# Patient Record
Sex: Male | Born: 1955 | Race: White | Hispanic: No | Marital: Married | Smoking: Former smoker
Health system: Southern US, Community
[De-identification: ages and names within clinical notes are randomized; demographics above are authoritative.]

## PROBLEM LIST (undated history)

## (undated) DIAGNOSIS — K222 Esophageal obstruction: Secondary | ICD-10-CM

## (undated) DIAGNOSIS — E7800 Pure hypercholesterolemia, unspecified: Secondary | ICD-10-CM

## (undated) DIAGNOSIS — K449 Diaphragmatic hernia without obstruction or gangrene: Secondary | ICD-10-CM

## (undated) DIAGNOSIS — K219 Gastro-esophageal reflux disease without esophagitis: Secondary | ICD-10-CM

## (undated) DIAGNOSIS — Z8719 Personal history of other diseases of the digestive system: Secondary | ICD-10-CM

## (undated) DIAGNOSIS — D369 Benign neoplasm, unspecified site: Secondary | ICD-10-CM

## (undated) DIAGNOSIS — K760 Fatty (change of) liver, not elsewhere classified: Secondary | ICD-10-CM

## (undated) DIAGNOSIS — I10 Essential (primary) hypertension: Secondary | ICD-10-CM

## (undated) DIAGNOSIS — E119 Type 2 diabetes mellitus without complications: Secondary | ICD-10-CM

## (undated) DIAGNOSIS — M1990 Unspecified osteoarthritis, unspecified site: Secondary | ICD-10-CM

## (undated) DIAGNOSIS — G4730 Sleep apnea, unspecified: Secondary | ICD-10-CM

## (undated) HISTORY — DX: Esophageal obstruction: K22.2

## (undated) HISTORY — DX: Diaphragmatic hernia without obstruction or gangrene: K44.9

## (undated) HISTORY — DX: Benign neoplasm, unspecified site: D36.9

---

## 2005-04-08 ENCOUNTER — Ambulatory Visit (HOSPITAL_COMMUNITY): Admission: RE | Admit: 2005-04-08 | Discharge: 2005-04-08 | Payer: Self-pay | Admitting: Internal Medicine

## 2010-01-01 ENCOUNTER — Ambulatory Visit: Payer: Self-pay | Admitting: Internal Medicine

## 2010-01-01 DIAGNOSIS — K921 Melena: Secondary | ICD-10-CM | POA: Insufficient documentation

## 2010-01-01 DIAGNOSIS — K219 Gastro-esophageal reflux disease without esophagitis: Secondary | ICD-10-CM

## 2010-01-01 DIAGNOSIS — R1310 Dysphagia, unspecified: Secondary | ICD-10-CM | POA: Insufficient documentation

## 2010-01-05 ENCOUNTER — Encounter: Payer: Self-pay | Admitting: Internal Medicine

## 2010-01-19 ENCOUNTER — Ambulatory Visit: Payer: Self-pay | Admitting: Internal Medicine

## 2010-01-19 ENCOUNTER — Ambulatory Visit (HOSPITAL_COMMUNITY): Admission: RE | Admit: 2010-01-19 | Discharge: 2010-01-19 | Payer: Self-pay | Admitting: Internal Medicine

## 2010-01-19 HISTORY — PX: ESOPHAGOGASTRODUODENOSCOPY: SHX1529

## 2010-01-19 HISTORY — PX: COLONOSCOPY: SHX174

## 2010-01-21 ENCOUNTER — Encounter: Payer: Self-pay | Admitting: Internal Medicine

## 2010-07-24 ENCOUNTER — Ambulatory Visit (HOSPITAL_COMMUNITY)
Admission: RE | Admit: 2010-07-24 | Discharge: 2010-07-24 | Payer: Self-pay | Source: Home / Self Care | Admitting: Orthopaedic Surgery

## 2010-08-18 ENCOUNTER — Ambulatory Visit (HOSPITAL_COMMUNITY)
Admission: RE | Admit: 2010-08-18 | Discharge: 2010-08-18 | Payer: Self-pay | Source: Home / Self Care | Attending: Orthopaedic Surgery | Admitting: Orthopaedic Surgery

## 2010-08-21 ENCOUNTER — Encounter (HOSPITAL_COMMUNITY)
Admission: RE | Admit: 2010-08-21 | Discharge: 2010-09-20 | Payer: Self-pay | Source: Home / Self Care | Attending: Orthopaedic Surgery | Admitting: Orthopaedic Surgery

## 2010-08-30 HISTORY — PX: JOINT REPLACEMENT: SHX530

## 2010-09-21 ENCOUNTER — Encounter (HOSPITAL_COMMUNITY)
Admission: RE | Admit: 2010-09-21 | Discharge: 2010-09-29 | Payer: Self-pay | Source: Home / Self Care | Attending: Orthopaedic Surgery | Admitting: Orthopaedic Surgery

## 2010-09-29 NOTE — Letter (Signed)
Summary: EGD/ED/TCS ORDER  EGD/ED/TCS ORDER   Imported By: Ave Filter 01/01/2010 14:40:08  _____________________________________________________________________  External Attachment:    Type:   Image     Comment:   External Document

## 2010-09-29 NOTE — Assessment & Plan Note (Signed)
Summary: ESOPHAGEAL STRICTURE,CONSULT FOR TCS/SS   Visit Type:  Initial Consult Referring Provider:  McGough Primary Care Provider:  McGough  Chief Complaint:  difficulty swallowing.  History of Present Illness: [name] is a pleasant 55 y/o WM, patient of Dr. Regino Schultze, who presents for further evaluation of difficulty swallowing and brbpr. He was diagnosed with GERD 25 years ago and started on medication then. However, over last 20 years, had mild GERD and not on medications. Two months ago started having bad heartburn. Solid foods and carbonated beverages would not go down. At times, breads and meats would come back up. Some odynophagia. No hematemesis. No constipation, diarrhea, abd pain, unintentional weight loss. Recent brbpr on toilet tissue. Takes Aleve occasionally for aches and pains. Since on Aciphex for past three weeks he has noted some improvement in swallowing.      Current Medications (verified): 1)  Aciphex 20 Mg Tbec (Rabeprazole Sodium) .... Take 1 Tablet By Mouth Once A Day 2)  Zocor 10 Mg Tabs (Simvastatin) .... Take 1 Tab By Mouth At Bedtime 3)  Ziac 10-6.25 Mg Tabs (Bisoprolol-Hydrochlorothiazide) .... Take 1 Tablet By Mouth Once A Day 4)  Fish Oil  1200 .... Two Tablets Every Am 5)  Asa 81 Mg .... Take 1 Tablet By Mouth Once A Day 6)  Multi-Vitamin .... Take 1 Tablet By Mouth Once A Day 7)  Aleve .... As Needed  Allergies (verified): No Known Drug Allergies  Past History:  Past Medical History: GERD Hyperlipidemia Hypertension Allergies  Past Surgical History: Unremarkable  Family History: Brother, deceased, cirrhosis - alcohol and drug related No FH CRC, IBD.  Social History: Married. Two children. None smoker, quit 30 yrs ago. Chews tobacco. No alcohol for four years. Never heavy. No drug use.  Review of Systems General:  Denies fever, chills, sweats, anorexia, fatigue, weakness, and weight loss. Eyes:  Denies vision loss. ENT:  Complains of  difficulty swallowing; denies nasal congestion, sore throat, and hoarseness. CV:  Denies chest pains, angina, palpitations, dyspnea on exertion, and peripheral edema. Resp:  Denies dyspnea at rest, dyspnea with exercise, and cough. GI:  See HPI. GU:  Denies urinary burning and blood in urine. MS:  Complains of joint pain / LOM. Derm:  Denies rash and itching. Neuro:  Denies weakness, frequent headaches, memory loss, and confusion. Psych:  Denies depression and anxiety. Endo:  Denies unusual weight change. Heme:  Denies bruising and bleeding. Allergy:  Denies hives and rash.  Vital Signs:  Patient profile:   55 year old male Height:      73 inches Weight:      264 pounds BMI:     34.96 Temp:     97.5 degrees F oral Pulse rate:   60 / minute BP sitting:   130 / 84  (left arm) Cuff size:   large  Vitals Entered By: Cloria Spring LPN (Jan 01, 6961 1:58 PM)  Physical Exam  General:  Well developed, well nourished, no acute distress. Head:  Normocephalic and atraumatic. Eyes:  Conjunctivae pink, no scleral icterus.  Mouth:  Oropharyngeal mucosa moist, pink.  No lesions, erythema or exudate.    Neck:  Supple; no masses or thyromegaly. Lungs:  Clear throughout to auscultation. Heart:  Regular rate and rhythm; no murmurs, rubs,  or bruits. Abdomen:  obese.  Bowel sounds normal.  Abdomen is soft, nontender, nondistended.  No rebound or guarding.  No hepatosplenomegaly, masses.  No abdominal bruits. Small easily reducible umbilical hernia. Rectal:  deferred until time of colonoscopy.   Extremities:  No clubbing, cyanosis, edema or deformities noted. Neurologic:  Alert and  oriented x4;  grossly normal neurologically. Skin:  Ruddy complexion.  Cervical Nodes:  No significant cervical adenopathy. Psych:  Alert and cooperative. Normal mood and affect.  Impression & Recommendations:  Problem # 1:  DYSPHAGIA UNSPECIFIED (ICD-787.20)  Dysphagia with recent recurrence of GERD. Suspect  esophagitis vs esophageal stricture/ring. Some improvement on Aciphex now. Also need screen for Barrett's esophagus. EGD/ED to be performed in near future.  Risks, alternatives, benefits including but not limited to risk of reaction to medications, bleeding, infection, and perforation addressed.  Patient voiced understanding and verbal consent obtained.   Continue Aciphex 20mg  by mouth daily. #20 samples provided.   Orders: Consultation Level III ([id number])  Problem # 2:  HEMATOCHEZIA (ICD-578.1)  Recent onset. No prior TCS. Colonoscopy to be performed in near future.  Risks, alternatives, and benefits including but not limited to the risk of reaction to medication, bleeding, infection, and perforation were addressed.  Patient voiced understanding and provided verbal consent.   Orders: Consultation Level III [phone]) I would like to thank Dr. Regino Schultze for allowing [place] to take part in the care of this nice patient.

## 2010-09-29 NOTE — Letter (Signed)
Summary: Patient Notice, Colon Biopsy Results  [facility]  [address]   [place], [place] [address]   Phone: [phone]  Fax: [phone]       Jan 21, 2010   Jonathan Grimes [address] [place], [place]  [address] 1955-09-26    Dear Jonathan Grimes,  I am pleased to inform you that the biopsies taken during your recent colonoscopy did not show any evidence of cancer upon pathologic examination.  Additional information/recommendations:  No further action is needed at this time.  Please follow-up with your primary care physician for your other healthcare needs.  Continue with the treatment plan as outlined on the day of your exam.  You should have a repeat colonoscopy examination  in 7 years.  Please call us if you are having persistent problems or have questions about your condition that have not been fully answered at this time.  Sincerely,    R. Roetta Sessions MD, FACP [facility] Ph: [phone]    Fax: [phone]   Appended Document: Patient Notice, Colon Biopsy Results letter mailed to pt  Appended Document: Patient Notice, Colon Biopsy Results reminder in computer

## 2010-09-29 NOTE — Letter (Signed)
Summary: External Other  External Other   Imported By: Peggyann Shoals 01/05/2010 13:50:41  _____________________________________________________________________  External Attachment:    Type:   Image     Comment:   External Document

## 2010-09-30 ENCOUNTER — Ambulatory Visit (HOSPITAL_COMMUNITY): Payer: Self-pay

## 2010-09-30 ENCOUNTER — Ambulatory Visit (HOSPITAL_COMMUNITY)
Admission: RE | Admit: 2010-09-30 | Discharge: 2010-09-30 | Disposition: A | Discharge status: Home / Self Care | Payer: PRIVATE HEALTH INSURANCE | Source: Ambulatory Visit | Attending: Orthopaedic Surgery | Admitting: Orthopaedic Surgery

## 2010-09-30 DIAGNOSIS — IMO0001 Reserved for inherently not codable concepts without codable children: Secondary | ICD-10-CM | POA: Insufficient documentation

## 2010-09-30 DIAGNOSIS — M25569 Pain in unspecified knee: Secondary | ICD-10-CM | POA: Insufficient documentation

## 2010-09-30 DIAGNOSIS — M6281 Muscle weakness (generalized): Secondary | ICD-10-CM | POA: Insufficient documentation

## 2010-09-30 DIAGNOSIS — M25669 Stiffness of unspecified knee, not elsewhere classified: Secondary | ICD-10-CM | POA: Insufficient documentation

## 2010-10-02 ENCOUNTER — Ambulatory Visit (HOSPITAL_COMMUNITY): Payer: Self-pay

## 2010-10-02 ENCOUNTER — Encounter (HOSPITAL_COMMUNITY)
Admission: RE | Admit: 2010-10-02 | Discharge: 2010-10-02 | Disposition: A | Discharge status: Home / Self Care | Payer: PRIVATE HEALTH INSURANCE | Source: Ambulatory Visit | Attending: Orthopaedic Surgery | Admitting: Orthopaedic Surgery

## 2010-10-02 DIAGNOSIS — M6281 Muscle weakness (generalized): Secondary | ICD-10-CM | POA: Insufficient documentation

## 2010-10-02 DIAGNOSIS — M25669 Stiffness of unspecified knee, not elsewhere classified: Secondary | ICD-10-CM | POA: Insufficient documentation

## 2010-10-02 DIAGNOSIS — IMO0001 Reserved for inherently not codable concepts without codable children: Secondary | ICD-10-CM | POA: Insufficient documentation

## 2010-10-02 DIAGNOSIS — M25569 Pain in unspecified knee: Secondary | ICD-10-CM | POA: Insufficient documentation

## 2010-10-05 ENCOUNTER — Ambulatory Visit (HOSPITAL_COMMUNITY)
Admission: RE | Admit: 2010-10-05 | Discharge: 2010-10-05 | Disposition: A | Discharge status: Home / Self Care | Payer: PRIVATE HEALTH INSURANCE | Source: Ambulatory Visit | Attending: Orthopaedic Surgery | Admitting: Orthopaedic Surgery

## 2010-10-05 DIAGNOSIS — I10 Essential (primary) hypertension: Secondary | ICD-10-CM | POA: Insufficient documentation

## 2010-10-05 DIAGNOSIS — M25669 Stiffness of unspecified knee, not elsewhere classified: Secondary | ICD-10-CM | POA: Insufficient documentation

## 2010-10-05 DIAGNOSIS — M25569 Pain in unspecified knee: Secondary | ICD-10-CM | POA: Insufficient documentation

## 2010-10-05 DIAGNOSIS — IMO0001 Reserved for inherently not codable concepts without codable children: Secondary | ICD-10-CM | POA: Insufficient documentation

## 2010-10-05 DIAGNOSIS — R262 Difficulty in walking, not elsewhere classified: Secondary | ICD-10-CM | POA: Insufficient documentation

## 2010-10-05 DIAGNOSIS — M6281 Muscle weakness (generalized): Secondary | ICD-10-CM | POA: Insufficient documentation

## 2010-10-07 ENCOUNTER — Ambulatory Visit (HOSPITAL_COMMUNITY)
Admission: RE | Admit: 2010-10-07 | Discharge: 2010-10-07 | Disposition: A | Discharge status: Home / Self Care | Payer: PRIVATE HEALTH INSURANCE | Source: Ambulatory Visit | Attending: Orthopaedic Surgery | Admitting: Orthopaedic Surgery

## 2010-10-07 DIAGNOSIS — M25669 Stiffness of unspecified knee, not elsewhere classified: Secondary | ICD-10-CM | POA: Insufficient documentation

## 2010-10-07 DIAGNOSIS — M25569 Pain in unspecified knee: Secondary | ICD-10-CM | POA: Insufficient documentation

## 2010-10-07 DIAGNOSIS — IMO0001 Reserved for inherently not codable concepts without codable children: Secondary | ICD-10-CM | POA: Insufficient documentation

## 2010-10-07 DIAGNOSIS — M6281 Muscle weakness (generalized): Secondary | ICD-10-CM | POA: Insufficient documentation

## 2010-10-09 ENCOUNTER — Ambulatory Visit (HOSPITAL_COMMUNITY)
Admission: RE | Admit: 2010-10-09 | Discharge: 2010-10-09 | Disposition: A | Discharge status: Home / Self Care | Payer: PRIVATE HEALTH INSURANCE | Source: Ambulatory Visit | Attending: Orthopaedic Surgery | Admitting: Orthopaedic Surgery

## 2010-10-09 DIAGNOSIS — M6281 Muscle weakness (generalized): Secondary | ICD-10-CM | POA: Insufficient documentation

## 2010-10-09 DIAGNOSIS — M25569 Pain in unspecified knee: Secondary | ICD-10-CM | POA: Insufficient documentation

## 2010-10-09 DIAGNOSIS — M25669 Stiffness of unspecified knee, not elsewhere classified: Secondary | ICD-10-CM | POA: Insufficient documentation

## 2010-10-09 DIAGNOSIS — R262 Difficulty in walking, not elsewhere classified: Secondary | ICD-10-CM | POA: Insufficient documentation

## 2010-10-09 DIAGNOSIS — IMO0001 Reserved for inherently not codable concepts without codable children: Secondary | ICD-10-CM | POA: Insufficient documentation

## 2010-10-09 DIAGNOSIS — I10 Essential (primary) hypertension: Secondary | ICD-10-CM | POA: Insufficient documentation

## 2010-10-26 ENCOUNTER — Ambulatory Visit (HOSPITAL_COMMUNITY)
Admission: RE | Admit: 2010-10-26 | Discharge: 2010-10-26 | Disposition: A | Discharge status: Home / Self Care | Payer: PRIVATE HEALTH INSURANCE | Source: Ambulatory Visit | Attending: Orthopaedic Surgery | Admitting: Orthopaedic Surgery

## 2010-10-26 DIAGNOSIS — M25669 Stiffness of unspecified knee, not elsewhere classified: Secondary | ICD-10-CM | POA: Insufficient documentation

## 2010-10-26 DIAGNOSIS — M25569 Pain in unspecified knee: Secondary | ICD-10-CM | POA: Insufficient documentation

## 2010-10-26 DIAGNOSIS — IMO0001 Reserved for inherently not codable concepts without codable children: Secondary | ICD-10-CM | POA: Insufficient documentation

## 2010-10-26 DIAGNOSIS — M6281 Muscle weakness (generalized): Secondary | ICD-10-CM | POA: Insufficient documentation

## 2010-10-29 ENCOUNTER — Ambulatory Visit (HOSPITAL_COMMUNITY)
Admission: RE | Admit: 2010-10-29 | Discharge: 2010-10-29 | Disposition: A | Discharge status: Home / Self Care | Payer: PRIVATE HEALTH INSURANCE | Source: Ambulatory Visit | Attending: Orthopaedic Surgery | Admitting: Orthopaedic Surgery

## 2010-10-29 DIAGNOSIS — M6281 Muscle weakness (generalized): Secondary | ICD-10-CM | POA: Insufficient documentation

## 2010-10-29 DIAGNOSIS — M25569 Pain in unspecified knee: Secondary | ICD-10-CM | POA: Insufficient documentation

## 2010-10-29 DIAGNOSIS — IMO0001 Reserved for inherently not codable concepts without codable children: Secondary | ICD-10-CM | POA: Insufficient documentation

## 2010-10-29 DIAGNOSIS — M25669 Stiffness of unspecified knee, not elsewhere classified: Secondary | ICD-10-CM | POA: Insufficient documentation

## 2010-11-02 ENCOUNTER — Ambulatory Visit (HOSPITAL_COMMUNITY)
Admission: RE | Admit: 2010-11-02 | Discharge: 2010-11-02 | Disposition: A | Discharge status: Home / Self Care | Payer: PRIVATE HEALTH INSURANCE | Source: Ambulatory Visit | Attending: *Deleted | Admitting: *Deleted

## 2010-11-05 ENCOUNTER — Ambulatory Visit (HOSPITAL_COMMUNITY)
Admission: RE | Admit: 2010-11-05 | Discharge: 2010-11-05 | Disposition: A | Discharge status: Home / Self Care | Payer: PRIVATE HEALTH INSURANCE | Source: Ambulatory Visit | Attending: Orthopaedic Surgery | Admitting: Orthopaedic Surgery

## 2010-11-09 LAB — DIFFERENTIAL
Basophils Absolute: 0 10*3/uL (ref 0.0–0.1)
Basophils Relative: 1 % (ref 0–1)
Monocytes Relative: 8 % (ref 3–12)
Neutro Abs: 3.8 10*3/uL (ref 1.7–7.7)
Neutrophils Relative %: 59 % (ref 43–77)

## 2010-11-09 LAB — COMPREHENSIVE METABOLIC PANEL
Alkaline Phosphatase: 61 U/L (ref 39–117)
BUN: 17 mg/dL (ref 6–23)
Glucose, Bld: 105 mg/dL — ABNORMAL HIGH (ref 70–99)
Potassium: 4.1 mEq/L (ref 3.5–5.1)
Total Bilirubin: 0.8 mg/dL (ref 0.3–1.2)
Total Protein: 6.6 g/dL (ref 6.0–8.3)

## 2010-11-09 LAB — URINALYSIS, ROUTINE W REFLEX MICROSCOPIC
Ketones, ur: NEGATIVE mg/dL
Nitrite: NEGATIVE
Protein, ur: NEGATIVE mg/dL

## 2010-11-09 LAB — CBC
HCT: 40.5 % (ref 39.0–52.0)
MCH: 32.3 pg (ref 26.0–34.0)
MCV: 92.7 fL (ref 78.0–100.0)
RDW: 13.3 % (ref 11.5–15.5)
WBC: 6.5 10*3/uL (ref 4.0–10.5)

## 2010-11-09 LAB — SURGICAL PCR SCREEN: MRSA, PCR: NEGATIVE

## 2010-12-01 ENCOUNTER — Other Ambulatory Visit (HOSPITAL_COMMUNITY): Payer: Self-pay | Admitting: Orthopaedic Surgery

## 2010-12-01 DIAGNOSIS — M25561 Pain in right knee: Secondary | ICD-10-CM

## 2010-12-07 ENCOUNTER — Ambulatory Visit (HOSPITAL_COMMUNITY)
Admission: RE | Admit: 2010-12-07 | Discharge: 2010-12-07 | Disposition: A | Discharge status: Home / Self Care | Payer: BC Managed Care – PPO | Source: Ambulatory Visit | Attending: Orthopaedic Surgery | Admitting: Orthopaedic Surgery

## 2010-12-07 DIAGNOSIS — R937 Abnormal findings on diagnostic imaging of other parts of musculoskeletal system: Secondary | ICD-10-CM | POA: Insufficient documentation

## 2010-12-07 DIAGNOSIS — M25561 Pain in right knee: Secondary | ICD-10-CM

## 2010-12-07 DIAGNOSIS — M25469 Effusion, unspecified knee: Secondary | ICD-10-CM | POA: Insufficient documentation

## 2010-12-07 DIAGNOSIS — M25569 Pain in unspecified knee: Secondary | ICD-10-CM | POA: Insufficient documentation

## 2011-02-03 ENCOUNTER — Ambulatory Visit (HOSPITAL_COMMUNITY)
Admission: RE | Admit: 2011-02-03 | Discharge: 2011-02-03 | Disposition: A | Discharge status: Home / Self Care | Payer: BC Managed Care – PPO | Source: Ambulatory Visit | Attending: Orthopaedic Surgery | Admitting: Orthopaedic Surgery

## 2011-02-03 DIAGNOSIS — M25569 Pain in unspecified knee: Secondary | ICD-10-CM | POA: Insufficient documentation

## 2011-02-03 DIAGNOSIS — M6281 Muscle weakness (generalized): Secondary | ICD-10-CM | POA: Insufficient documentation

## 2011-02-03 DIAGNOSIS — M25669 Stiffness of unspecified knee, not elsewhere classified: Secondary | ICD-10-CM | POA: Insufficient documentation

## 2011-02-03 DIAGNOSIS — IMO0001 Reserved for inherently not codable concepts without codable children: Secondary | ICD-10-CM | POA: Insufficient documentation

## 2011-02-08 ENCOUNTER — Inpatient Hospital Stay (HOSPITAL_COMMUNITY)
Admission: RE | Admit: 2011-02-08 | Discharge: 2011-02-08 | Disposition: A | Discharge status: Home / Self Care | Payer: BC Managed Care – PPO | Source: Ambulatory Visit | Attending: Physical Therapy | Admitting: Physical Therapy

## 2011-02-10 ENCOUNTER — Inpatient Hospital Stay (HOSPITAL_COMMUNITY)
Admission: RE | Admit: 2011-02-10 | Discharge: 2011-02-10 | Disposition: A | Discharge status: Home / Self Care | Payer: BC Managed Care – PPO | Source: Ambulatory Visit | Attending: Physical Therapy | Admitting: Physical Therapy

## 2011-02-12 ENCOUNTER — Ambulatory Visit (HOSPITAL_COMMUNITY)
Admission: RE | Admit: 2011-02-12 | Discharge: 2011-02-12 | Disposition: A | Discharge status: Home / Self Care | Payer: BC Managed Care – PPO | Source: Ambulatory Visit | Attending: Family Medicine | Admitting: Family Medicine

## 2011-02-16 ENCOUNTER — Ambulatory Visit (HOSPITAL_COMMUNITY)
Admission: RE | Admit: 2011-02-16 | Discharge: 2011-02-16 | Disposition: A | Discharge status: Home / Self Care | Payer: BC Managed Care – PPO | Source: Ambulatory Visit | Attending: Family Medicine | Admitting: Family Medicine

## 2011-02-18 ENCOUNTER — Ambulatory Visit (HOSPITAL_COMMUNITY)
Admission: RE | Admit: 2011-02-18 | Discharge: 2011-02-18 | Disposition: A | Discharge status: Home / Self Care | Payer: BC Managed Care – PPO | Source: Ambulatory Visit | Attending: Family Medicine | Admitting: Family Medicine

## 2011-02-23 ENCOUNTER — Ambulatory Visit (HOSPITAL_COMMUNITY)
Admission: RE | Admit: 2011-02-23 | Discharge: 2011-02-23 | Disposition: A | Discharge status: Home / Self Care | Payer: BC Managed Care – PPO | Source: Ambulatory Visit | Attending: Family Medicine | Admitting: Family Medicine

## 2011-02-25 ENCOUNTER — Ambulatory Visit (HOSPITAL_COMMUNITY)
Admission: RE | Admit: 2011-02-25 | Discharge: 2011-02-25 | Disposition: A | Discharge status: Home / Self Care | Payer: BC Managed Care – PPO | Source: Ambulatory Visit | Attending: Family Medicine | Admitting: Family Medicine

## 2011-03-01 ENCOUNTER — Ambulatory Visit (HOSPITAL_COMMUNITY)
Admission: RE | Admit: 2011-03-01 | Discharge: 2011-03-01 | Disposition: A | Discharge status: Home / Self Care | Payer: BC Managed Care – PPO | Source: Ambulatory Visit | Attending: Orthopaedic Surgery | Admitting: Orthopaedic Surgery

## 2011-03-01 DIAGNOSIS — IMO0001 Reserved for inherently not codable concepts without codable children: Secondary | ICD-10-CM | POA: Insufficient documentation

## 2011-03-01 DIAGNOSIS — M25669 Stiffness of unspecified knee, not elsewhere classified: Secondary | ICD-10-CM | POA: Insufficient documentation

## 2011-03-01 DIAGNOSIS — M25569 Pain in unspecified knee: Secondary | ICD-10-CM | POA: Insufficient documentation

## 2011-03-01 DIAGNOSIS — M6281 Muscle weakness (generalized): Secondary | ICD-10-CM | POA: Insufficient documentation

## 2011-03-04 ENCOUNTER — Ambulatory Visit (HOSPITAL_COMMUNITY)
Admission: RE | Admit: 2011-03-04 | Discharge: 2011-03-04 | Disposition: A | Discharge status: Home / Self Care | Payer: BC Managed Care – PPO | Source: Ambulatory Visit | Attending: Family Medicine | Admitting: Family Medicine

## 2011-06-14 DIAGNOSIS — Z09 Encounter for follow-up examination after completed treatment for conditions other than malignant neoplasm: Secondary | ICD-10-CM | POA: Insufficient documentation

## 2011-09-28 DIAGNOSIS — L989 Disorder of the skin and subcutaneous tissue, unspecified: Secondary | ICD-10-CM | POA: Insufficient documentation

## 2012-06-13 DIAGNOSIS — M8700 Idiopathic aseptic necrosis of unspecified bone: Secondary | ICD-10-CM | POA: Insufficient documentation

## 2012-07-04 ENCOUNTER — Other Ambulatory Visit (HOSPITAL_COMMUNITY): Payer: Self-pay | Admitting: Orthopaedic Surgery

## 2012-07-20 ENCOUNTER — Encounter (HOSPITAL_COMMUNITY): Payer: Self-pay | Admitting: Pharmacy Technician

## 2012-07-21 ENCOUNTER — Encounter (HOSPITAL_COMMUNITY): Payer: Self-pay

## 2012-07-21 ENCOUNTER — Encounter (HOSPITAL_COMMUNITY)
Admission: RE | Admit: 2012-07-21 | Discharge: 2012-07-21 | Disposition: A | Discharge status: Home / Self Care | Payer: BC Managed Care – PPO | Source: Ambulatory Visit | Attending: Orthopaedic Surgery | Admitting: Orthopaedic Surgery

## 2012-07-21 ENCOUNTER — Ambulatory Visit (HOSPITAL_COMMUNITY)
Admission: RE | Admit: 2012-07-21 | Discharge: 2012-07-21 | Disposition: A | Discharge status: Home / Self Care | Payer: BC Managed Care – PPO | Source: Ambulatory Visit | Attending: Orthopaedic Surgery | Admitting: Orthopaedic Surgery

## 2012-07-21 DIAGNOSIS — Z01818 Encounter for other preprocedural examination: Secondary | ICD-10-CM | POA: Insufficient documentation

## 2012-07-21 HISTORY — DX: Essential (primary) hypertension: I10

## 2012-07-21 HISTORY — DX: Personal history of other diseases of the digestive system: Z87.19

## 2012-07-21 HISTORY — DX: Fatty (change of) liver, not elsewhere classified: K76.0

## 2012-07-21 HISTORY — DX: Unspecified osteoarthritis, unspecified site: M19.90

## 2012-07-21 HISTORY — DX: Pure hypercholesterolemia, unspecified: E78.00

## 2012-07-21 HISTORY — DX: Gastro-esophageal reflux disease without esophagitis: K21.9

## 2012-07-21 LAB — URINALYSIS, ROUTINE W REFLEX MICROSCOPIC
Glucose, UA: NEGATIVE mg/dL
Hgb urine dipstick: NEGATIVE
Ketones, ur: NEGATIVE mg/dL
Protein, ur: NEGATIVE mg/dL

## 2012-07-21 LAB — BASIC METABOLIC PANEL
BUN: 15 mg/dL (ref 6–23)
Chloride: 101 mEq/L (ref 96–112)
Creatinine, Ser: 0.83 mg/dL (ref 0.50–1.35)
GFR calc non Af Amer: >90 mL/min (ref >90)
Glucose, Bld: 109 mg/dL — ABNORMAL HIGH (ref 70–99)
Potassium: 4.1 mEq/L (ref 3.5–5.1)

## 2012-07-21 LAB — CBC
HCT: 41.2 % (ref 39.0–52.0)
Hemoglobin: 13.8 g/dL (ref 13.0–17.0)
MCH: 30.5 pg (ref 26.0–34.0)
MCHC: 33.5 g/dL (ref 30.0–36.0)

## 2012-07-21 NOTE — Progress Notes (Signed)
07/21/12 1401  OBSTRUCTIVE SLEEP APNEA  Have you ever been diagnosed with sleep apnea through a sleep study? No  Do you snore loudly (loud enough to be heard through closed doors)?  1  Do you often feel tired, fatigued, or sleepy during the daytime? 1  Has anyone observed you stop breathing during your sleep? 1  Do you have, or are you being treated for high blood pressure? 1  BMI more than 35 kg/m2? 0  Age over 57 years old? 1  Neck circumference greater than 40 cm/18 inches? 1  Gender: 1  Obstructive Sleep Apnea Score 7   Score 4 or greater  Results sent to PCP

## 2012-07-21 NOTE — Pre-Procedure Instructions (Signed)
CBC, BMET, PT, PTT, UA, EKG, CXR WERE DONE TODAY AT [facility] -PREOP - AS PER ORDERS DR. Maureen Ralphs AND ANESTHESIOLOGIST'S GUIDELINES.  T/S WILL BE DONE DAY OF SURGERY.

## 2012-07-21 NOTE — Patient Instructions (Signed)
YOUR SURGERY IS SCHEDULED AT [facility]  ON:  Friday  11/29  AT 9:50 AM  REPORT TO [facility] AT:  7:45 AM      PHONE # FOR SHORT STAY IS [phone]  DO NOT EAT OR DRINK ANYTHING AFTER MIDNIGHT THE NIGHT BEFORE YOUR SURGERY.  YOU MAY BRUSH YOUR TEETH, RINSE OUT YOUR MOUTH--BUT NO WATER, NO FOOD, NO CHEWING GUM, NO MINTS, NO CANDIES, NO CHEWING TOBACCO.  PLEASE TAKE THE FOLLOWING MEDICATIONS THE AM OF YOUR SURGERY WITH A FEW SIPS OF WATER:  LIPITOR, OMEPRAZOLE, AND HYDROCODONE/ACETAMINOPHEN FOR PAIN IF NEEDED.   IF YOU USE INHALERS--USE YOUR INHALERS THE AM OF YOUR SURGERY AND BRING INHALERS TO THE HOSPITAL -TAKE TO SURGERY.    IF YOU ARE DIABETIC:  DO NOT TAKE ANY DIABETIC MEDICATIONS THE AM OF YOUR SURGERY.  IF YOU TAKE INSULIN IN THE EVENINGS--PLEASE ONLY TAKE 1/2 NORMAL EVENING DOSE THE NIGHT BEFORE YOUR SURGERY.  NO INSULIN THE AM OF YOUR SURGERY.  IF YOU HAVE SLEEP APNEA AND USE CPAP OR BIPAP--PLEASE BRING THE MASK AND THE TUBING.  DO NOT BRING YOUR MACHINE.  DO NOT BRING VALUABLES, MONEY, CREDIT CARDS.  DO NOT WEAR JEWELRY, MAKE-UP, NAIL POLISH AND NO METAL PINS OR CLIPS IN YOUR HAIR. CONTACT LENS, DENTURES / PARTIALS, GLASSES SHOULD NOT BE WORN TO SURGERY AND IN MOST CASES-HEARING AIDS WILL NEED TO BE REMOVED.  BRING YOUR GLASSES CASE, ANY EQUIPMENT NEEDED FOR YOUR CONTACT LENS. FOR PATIENTS ADMITTED TO THE HOSPITAL--CHECK OUT TIME THE DAY OF DISCHARGE IS 11:00 AM.  ALL INPATIENT ROOMS ARE PRIVATE - WITH BATHROOM, TELEPHONE, TELEVISION AND WIFI INTERNET.  IF YOU ARE BEING DISCHARGED THE SAME DAY OF YOUR SURGERY--YOU CAN NOT DRIVE YOURSELF HOME--AND SHOULD NOT GO HOME ALONE BY TAXI OR BUS.  NO DRIVING OR OPERATING MACHINERY FOR 24 HOURS FOLLOWING ANESTHESIA / PAIN MEDICATIONS.  PLEASE MAKE ARRANGEMENTS FOR SOMEONE TO BE WITH YOU AT HOME THE FIRST 24 HOURS AFTER SURGERY. RESPONSIBLE DRIVER'S NAME___________________________            PHONE #   _______________________                                  PLEASE READ OVER ANY  FACT SHEETS THAT YOU WERE GIVEN: MRSA INFORMATION, BLOOD TRANSFUSION INFORMATION, INCENTIVE SPIROMETER INFORMATION.

## 2012-07-28 ENCOUNTER — Encounter (HOSPITAL_COMMUNITY): Payer: Self-pay | Admitting: *Deleted

## 2012-07-28 ENCOUNTER — Ambulatory Visit (HOSPITAL_COMMUNITY): Payer: BC Managed Care – PPO

## 2012-07-28 ENCOUNTER — Encounter (HOSPITAL_COMMUNITY)
Admission: RE | Disposition: A | Discharge status: Home Health | Payer: Self-pay | Source: Ambulatory Visit | Attending: Orthopaedic Surgery

## 2012-07-28 ENCOUNTER — Encounter (HOSPITAL_COMMUNITY): Payer: Self-pay | Admitting: Anesthesiology

## 2012-07-28 ENCOUNTER — Inpatient Hospital Stay (HOSPITAL_COMMUNITY)
Admission: RE | Admit: 2012-07-28 | Discharge: 2012-07-29 | DRG: 818 | Disposition: A | Discharge status: Home Health | Payer: BC Managed Care – PPO | Source: Ambulatory Visit | Attending: Orthopaedic Surgery | Admitting: Orthopaedic Surgery

## 2012-07-28 ENCOUNTER — Ambulatory Visit (HOSPITAL_COMMUNITY): Payer: BC Managed Care – PPO | Admitting: Anesthesiology

## 2012-07-28 DIAGNOSIS — M169 Osteoarthritis of hip, unspecified: Secondary | ICD-10-CM

## 2012-07-28 DIAGNOSIS — I10 Essential (primary) hypertension: Secondary | ICD-10-CM | POA: Diagnosis present

## 2012-07-28 DIAGNOSIS — M1610 Unilateral primary osteoarthritis, unspecified hip: Principal | ICD-10-CM | POA: Diagnosis present

## 2012-07-28 DIAGNOSIS — K449 Diaphragmatic hernia without obstruction or gangrene: Secondary | ICD-10-CM | POA: Diagnosis present

## 2012-07-28 DIAGNOSIS — D62 Acute posthemorrhagic anemia: Secondary | ICD-10-CM | POA: Diagnosis not present

## 2012-07-28 DIAGNOSIS — K219 Gastro-esophageal reflux disease without esophagitis: Secondary | ICD-10-CM | POA: Diagnosis present

## 2012-07-28 HISTORY — PX: TOTAL HIP ARTHROPLASTY: SHX124

## 2012-07-28 LAB — ABO/RH: ABO/RH(D): O POS

## 2012-07-28 LAB — TYPE AND SCREEN: Antibody Screen: NEGATIVE

## 2012-07-28 SURGERY — ARTHROPLASTY, HIP, TOTAL, ANTERIOR APPROACH
Anesthesia: General | Site: Hip | Laterality: Left | Wound class: Clean

## 2012-07-28 MED ORDER — ROCURONIUM BROMIDE 100 MG/10ML IV SOLN
INTRAVENOUS | Status: DC | PRN
  Administered 2012-07-28: 50 mg via INTRAVENOUS
  Administered 2012-07-28 (×3): 10 mg via INTRAVENOUS

## 2012-07-28 MED ORDER — MIDAZOLAM HCL 5 MG/5ML IJ SOLN
INTRAMUSCULAR | Status: DC | PRN
  Administered 2012-07-28: 2 mg via INTRAVENOUS

## 2012-07-28 MED ORDER — METOCLOPRAMIDE HCL 5 MG/ML IJ SOLN: 5.0000 mg | Freq: Three times a day (TID) | INTRAMUSCULAR | Status: DC | PRN

## 2012-07-28 MED ORDER — METHOCARBAMOL 100 MG/ML IJ SOLN
500.0000 mg | Freq: Four times a day (QID) | INTRAVENOUS | Status: DC | PRN
  Administered 2012-07-28: 500 mg via INTRAVENOUS
  Filled 2012-07-28: qty 5

## 2012-07-28 MED ORDER — FENTANYL CITRATE 0.05 MG/ML IJ SOLN
INTRAMUSCULAR | Status: DC | PRN
  Administered 2012-07-28 (×2): 50 ug via INTRAVENOUS
  Administered 2012-07-28: 100 ug via INTRAVENOUS
  Administered 2012-07-28: 50 ug via INTRAVENOUS

## 2012-07-28 MED ORDER — BISOPROLOL-HYDROCHLOROTHIAZIDE 10-6.25 MG PO TABS
1.0000 | ORAL_TABLET | Freq: Every day | ORAL | Status: DC
  Administered 2012-07-29: 1 via ORAL
  Filled 2012-07-28: qty 1

## 2012-07-28 MED ORDER — NEOSTIGMINE METHYLSULFATE 1 MG/ML IJ SOLN
INTRAMUSCULAR | Status: DC | PRN
  Administered 2012-07-28: 5 mg via INTRAVENOUS

## 2012-07-28 MED ORDER — ACETAMINOPHEN 325 MG PO TABS: 650.0000 mg | ORAL_TABLET | Freq: Four times a day (QID) | ORAL | Status: DC | PRN

## 2012-07-28 MED ORDER — ONDANSETRON HCL 4 MG PO TABS: 4.0000 mg | ORAL_TABLET | Freq: Four times a day (QID) | ORAL | Status: DC | PRN

## 2012-07-28 MED ORDER — METOCLOPRAMIDE HCL 10 MG PO TABS: 5.0000 mg | ORAL_TABLET | Freq: Three times a day (TID) | ORAL | Status: DC | PRN

## 2012-07-28 MED ORDER — HYDROMORPHONE HCL PF 1 MG/ML IJ SOLN: 0.2500 mg | INTRAMUSCULAR | Status: DC | PRN

## 2012-07-28 MED ORDER — ASPIRIN EC 325 MG PO TBEC
325.0000 mg | DELAYED_RELEASE_TABLET | Freq: Two times a day (BID) | ORAL | Status: DC
  Administered 2012-07-29: 325 mg via ORAL
  Filled 2012-07-28 (×3): qty 1

## 2012-07-28 MED ORDER — METHOCARBAMOL 500 MG PO TABS: 500.0000 mg | ORAL_TABLET | Freq: Four times a day (QID) | ORAL | Status: DC | PRN

## 2012-07-28 MED ORDER — SODIUM CHLORIDE 0.9 % IV SOLN
INTRAVENOUS | Status: DC
  Administered 2012-07-28 – 2012-07-29 (×2): via INTRAVENOUS

## 2012-07-28 MED ORDER — DIPHENHYDRAMINE HCL 12.5 MG/5ML PO ELIX: 12.5000 mg | ORAL_SOLUTION | ORAL | Status: DC | PRN

## 2012-07-28 MED ORDER — DOCUSATE SODIUM 100 MG PO CAPS
100.0000 mg | ORAL_CAPSULE | Freq: Two times a day (BID) | ORAL | Status: DC
  Administered 2012-07-28 – 2012-07-29 (×2): 100 mg via ORAL

## 2012-07-28 MED ORDER — LACTATED RINGERS IV SOLN: INTRAVENOUS | Status: DC

## 2012-07-28 MED ORDER — ALUM & MAG HYDROXIDE-SIMETH 200-200-20 MG/5ML PO SUSP: 30.0000 mL | ORAL | Status: DC | PRN

## 2012-07-28 MED ORDER — CEFAZOLIN SODIUM-DEXTROSE 2-3 GM-% IV SOLR
INTRAVENOUS | Status: AC
  Filled 2012-07-28: qty 50

## 2012-07-28 MED ORDER — CEFAZOLIN SODIUM-DEXTROSE 2-3 GM-% IV SOLR
2.0000 g | Freq: Four times a day (QID) | INTRAVENOUS | Status: AC
  Administered 2012-07-28 (×2): 2 g via INTRAVENOUS
  Filled 2012-07-28 (×2): qty 50

## 2012-07-28 MED ORDER — OXYCODONE HCL 5 MG PO TABS
5.0000 mg | ORAL_TABLET | ORAL | Status: DC | PRN
  Administered 2012-07-28: 5 mg via ORAL
  Administered 2012-07-29: 10 mg via ORAL
  Filled 2012-07-28 (×2): qty 2

## 2012-07-28 MED ORDER — GLYCOPYRROLATE 0.2 MG/ML IJ SOLN
INTRAMUSCULAR | Status: DC | PRN
  Administered 2012-07-28: 0.6 mg via INTRAVENOUS

## 2012-07-28 MED ORDER — MENTHOL 3 MG MT LOZG
1.0000 | LOZENGE | OROMUCOSAL | Status: DC | PRN
  Filled 2012-07-28: qty 9

## 2012-07-28 MED ORDER — ACETAMINOPHEN 650 MG RE SUPP: 650.0000 mg | Freq: Four times a day (QID) | RECTAL | Status: DC | PRN

## 2012-07-28 MED ORDER — OXYCODONE HCL ER 20 MG PO T12A
20.0000 mg | EXTENDED_RELEASE_TABLET | Freq: Two times a day (BID) | ORAL | Status: DC
  Administered 2012-07-28 – 2012-07-29 (×3): 20 mg via ORAL
  Filled 2012-07-28 (×3): qty 1

## 2012-07-28 MED ORDER — ONDANSETRON HCL 4 MG/2ML IJ SOLN
INTRAMUSCULAR | Status: DC | PRN
  Administered 2012-07-28: 4 mg via INTRAVENOUS

## 2012-07-28 MED ORDER — KETOROLAC TROMETHAMINE 15 MG/ML IJ SOLN
15.0000 mg | Freq: Four times a day (QID) | INTRAMUSCULAR | Status: AC
  Administered 2012-07-28 – 2012-07-29 (×4): 15 mg via INTRAVENOUS
  Filled 2012-07-28 (×4): qty 1

## 2012-07-28 MED ORDER — 0.9 % SODIUM CHLORIDE (POUR BTL) OPTIME
TOPICAL | Status: DC | PRN
  Administered 2012-07-28: 1000 mL

## 2012-07-28 MED ORDER — LACTATED RINGERS IV SOLN
INTRAVENOUS | Status: DC
  Administered 2012-07-28: 11:00:00 via INTRAVENOUS
  Administered 2012-07-28: 1000 mL via INTRAVENOUS
  Administered 2012-07-28: 10:00:00 via INTRAVENOUS

## 2012-07-28 MED ORDER — ZOLPIDEM TARTRATE 5 MG PO TABS: 5.0000 mg | ORAL_TABLET | Freq: Every evening | ORAL | Status: DC | PRN

## 2012-07-28 MED ORDER — SUCCINYLCHOLINE CHLORIDE 20 MG/ML IJ SOLN
INTRAMUSCULAR | Status: DC | PRN
  Administered 2012-07-28: 100 mg via INTRAVENOUS

## 2012-07-28 MED ORDER — PANTOPRAZOLE SODIUM 40 MG PO TBEC
80.0000 mg | DELAYED_RELEASE_TABLET | Freq: Every day | ORAL | Status: DC
  Administered 2012-07-29: 80 mg via ORAL
  Filled 2012-07-28: qty 2

## 2012-07-28 MED ORDER — DEXAMETHASONE SODIUM PHOSPHATE 10 MG/ML IJ SOLN
INTRAMUSCULAR | Status: DC | PRN
  Administered 2012-07-28: 10 mg via INTRAVENOUS

## 2012-07-28 MED ORDER — BISOPROLOL FUMARATE 10 MG PO TABS
10.0000 mg | ORAL_TABLET | ORAL | Status: AC
  Administered 2012-07-28: 10 mg via ORAL
  Filled 2012-07-28: qty 1

## 2012-07-28 MED ORDER — PHENOL 1.4 % MT LIQD
1.0000 | OROMUCOSAL | Status: DC | PRN
  Filled 2012-07-28: qty 177

## 2012-07-28 MED ORDER — ONDANSETRON HCL 4 MG/2ML IJ SOLN: 4.0000 mg | Freq: Four times a day (QID) | INTRAMUSCULAR | Status: DC | PRN

## 2012-07-28 MED ORDER — HYDROMORPHONE HCL PF 1 MG/ML IJ SOLN: 1.0000 mg | INTRAMUSCULAR | Status: DC | PRN

## 2012-07-28 MED ORDER — LIDOCAINE HCL (CARDIAC) 20 MG/ML IV SOLN
INTRAVENOUS | Status: DC | PRN
  Administered 2012-07-28: 50 mg via INTRAVENOUS

## 2012-07-28 MED ORDER — ATORVASTATIN CALCIUM 10 MG PO TABS
10.0000 mg | ORAL_TABLET | Freq: Every day | ORAL | Status: DC
  Administered 2012-07-29: 10 mg via ORAL
  Filled 2012-07-28: qty 1

## 2012-07-28 MED ORDER — CEFAZOLIN SODIUM-DEXTROSE 2-3 GM-% IV SOLR
2.0000 g | INTRAVENOUS | Status: AC
  Administered 2012-07-28: 2 g via INTRAVENOUS

## 2012-07-28 MED ORDER — PROPOFOL 10 MG/ML IV BOLUS
INTRAVENOUS | Status: DC | PRN
  Administered 2012-07-28: 200 mg via INTRAVENOUS

## 2012-07-28 SURGICAL SUPPLY — 32 items
BAG SPEC THK2 15X12 ZIP CLS (MISCELLANEOUS) ×1
BAG ZIPLOCK 12X15 (MISCELLANEOUS) ×3 IMPLANT
BLADE SAW SGTL 18X1.27X75 (BLADE) ×2 IMPLANT
CELLS DAT CNTRL 66122 CELL SVR (MISCELLANEOUS) ×1 IMPLANT
CLOTH BEACON ORANGE TIMEOUT ST (SAFETY) ×2 IMPLANT
DRAPE C-ARM 42X72 X-RAY (DRAPES) ×2 IMPLANT
DRAPE STERI IOBAN 125X83 (DRAPES) ×2 IMPLANT
DRAPE U-SHAPE 47X51 STRL (DRAPES) ×6 IMPLANT
DRSG MEPILEX BORDER 4X8 (GAUZE/BANDAGES/DRESSINGS) ×2 IMPLANT
DURAPREP 26ML APPLICATOR (WOUND CARE) ×2 IMPLANT
ELECT BLADE TIP CTD 4 INCH (ELECTRODE) ×2 IMPLANT
ELECT REM PT RETURN 9FT ADLT (ELECTROSURGICAL) ×2
ELECTRODE REM PT RTRN 9FT ADLT (ELECTROSURGICAL) ×1 IMPLANT
FACESHIELD LNG OPTICON STERILE (SAFETY) ×7 IMPLANT
GAUZE XEROFORM 1X8 LF (GAUZE/BANDAGES/DRESSINGS) ×2 IMPLANT
GLOVE BIO SURGEON STRL SZ7.5 (GLOVE) ×2 IMPLANT
GLOVE BIOGEL PI IND STRL 8 (GLOVE) ×1 IMPLANT
GLOVE BIOGEL PI INDICATOR 8 (GLOVE) ×1
GOWN STRL REIN XL XLG (GOWN DISPOSABLE) ×5 IMPLANT
KIT BASIN OR (CUSTOM PROCEDURE TRAY) ×2 IMPLANT
PACK TOTAL JOINT (CUSTOM PROCEDURE TRAY) ×2 IMPLANT
PADDING CAST COTTON 6X4 STRL (CAST SUPPLIES) ×2 IMPLANT
RETRACTOR WND ALEXIS 18 MED (MISCELLANEOUS) IMPLANT
RTRCTR WOUND ALEXIS 18CM MED (MISCELLANEOUS) ×2
STAPLER VISISTAT 35W (STAPLE) ×1 IMPLANT
SUT ETHIBOND NAB CT1 #1 30IN (SUTURE) ×3 IMPLANT
SUT VIC AB 2-0 CT1 27 (SUTURE) ×4
SUT VIC AB 2-0 CT1 TAPERPNT 27 (SUTURE) ×2 IMPLANT
SUT VLOC 180 0 24IN GS25 (SUTURE) ×1 IMPLANT
TOWEL OR 17X26 10 PK STRL BLUE (TOWEL DISPOSABLE) ×3 IMPLANT
TOWEL OR NON WOVEN STRL DISP B (DISPOSABLE) ×2 IMPLANT
TRAY FOLEY CATH 14FRSI W/METER (CATHETERS) ×2 IMPLANT

## 2012-07-28 NOTE — Progress Notes (Signed)
Physical Therapy Evaluation Patient Details Name: Hartwell Burkeen MRN: [id number] DOB: 06/06/1956 Today's Date: 07/28/2012 Time: 4098-1191 PT Time Calculation (min): 30 min  PT Assessment / Plan / Recommendation Clinical Impression  Pt s/p L THR presents with decreased L LE strength/ROM and post op pain limiting functional mobiltiy    PT Assessment  Patient needs continued PT services    Follow Up Recommendations  Home health PT    Does the patient have the potential to tolerate intense rehabilitation      Barriers to Discharge None      Equipment Recommendations  None recommended by PT    Recommendations for Other Services OT consult   Frequency 7X/week    Precautions / Restrictions Precautions Precautions: None Restrictions Weight Bearing Restrictions: No Other Position/Activity Restrictions: WBAT   Pertinent Vitals/Pain 3/10      Mobility  Bed Mobility Bed Mobility: Supine to Sit Supine to Sit: 4: Min assist Details for Bed Mobility Assistance: cues for sequence and for use of R LE to self assist Transfers Transfers: Sit to Stand;Stand to Sit Sit to Stand: 4: Min assist Stand to Sit: 4: Min assist Details for Transfer Assistance: cues for use of UEs to self assist and for LE management Ambulation/Gait Ambulation/Gait Assistance: 4: Min assist Ambulation Distance (Feet): 100 Feet Assistive device: Rolling walker Ambulation/Gait Assistance Details: cues for posture and position from RW  Gait Pattern: Step-to pattern;Step-through pattern    Shoulder Instructions     Exercises Total Joint Exercises Ankle Circles/Pumps: AROM;Both;10 reps;Supine Quad Sets: AROM;Both;10 reps;Supine Heel Slides: AAROM;Left;10 reps;Supine Hip ABduction/ADduction: AAROM;Left;10 reps;Supine   PT Diagnosis: Difficulty walking  PT Problem List: Decreased strength;Decreased range of motion;Decreased activity tolerance;Decreased mobility;Decreased knowledge of use of DME;Pain PT  Treatment Interventions: DME instruction;Gait training;Stair training;Functional mobility training;Therapeutic activities;Therapeutic exercise;Patient/family education   PT Goals Acute Rehab PT Goals PT Goal Formulation: With patient Time For Goal Achievement: 07/31/12 Potential to Achieve Goals: Good Pt will go Supine/Side to Sit: with supervision PT Goal: Supine/Side to Sit - Progress: Goal set today Pt will go Sit to Supine/Side: with supervision PT Goal: Sit to Supine/Side - Progress: Goal set today Pt will go Sit to Stand: with supervision PT Goal: Sit to Stand - Progress: Goal set today Pt will go Stand to Sit: with supervision PT Goal: Stand to Sit - Progress: Goal set today Pt will Ambulate: >150 feet;with supervision;with rolling walker PT Goal: Ambulate - Progress: Goal set today Pt will Go Up / Down Stairs: 3-5 stairs;with min assist;with least restrictive assistive device PT Goal: Up/Down Stairs - Progress: Goal set today  Visit Information  Last PT Received On: 07/28/12 Assistance Needed: +1    Subjective Data  Subjective: I used a cane and this hip kept me from sleeping before surgery Patient Stated Goal: Resume previous lifestyle with decreased pain   Prior Functioning  Home Living Lives With: Spouse Available Help at Discharge: Family Type of Home: House Home Access: Stairs to enter Secretary/administrator of Steps: 3 Entrance Stairs-Rails: Right;Left;Can reach both Home Layout: One level Home Adaptive Equipment: Walker - rolling Prior Function Level of Independence: Independent with assistive device(s) Able to Take Stairs?: Yes Driving: Yes Vocation: Full time employment Communication Communication: No difficulties Dominant Hand: Left    Cognition  Overall Cognitive Status: Appears within functional limits for tasks assessed/performed Arousal/Alertness: Awake/alert Orientation Level: Appears intact for tasks assessed Behavior During Session: [facility] for  tasks performed    Extremity/Trunk Assessment Right Upper Extremity Assessment RUE ROM/Strength/Tone: [facility]  for tasks assessed Left Upper Extremity Assessment LUE ROM/Strength/Tone: [facility] for tasks assessed Right Lower Extremity Assessment RLE ROM/Strength/Tone: [facility] for tasks assessed Left Lower Extremity Assessment LLE ROM/Strength/Tone: Deficits LLE ROM/Strength/Tone Deficits: Hip strength 3-/5 with AAROM at hip to 90 flex and 20 abd   Balance    End of Session PT - End of Session Equipment Utilized During Treatment: Gait belt Activity Tolerance: Patient tolerated treatment well Patient left: in chair;with call bell/phone within reach;with family/visitor present Nurse Communication: Mobility status  GP     Betzaida Cremeens 07/28/2012, 5:07 PM

## 2012-07-28 NOTE — Anesthesia Postprocedure Evaluation (Signed)
  Anesthesia Post-op Note  Patient: Jonathan Grimes  Procedure(s) Performed: Procedure(s) (LRB): TOTAL HIP ARTHROPLASTY ANTERIOR APPROACH (Left)  Patient Location: PACU  Anesthesia Type: General  Level of Consciousness: awake and alert   Airway and Oxygen Therapy: Patient Spontanous Breathing  Post-op Pain: mild  Post-op Assessment: Post-op Vital signs reviewed, Patient's Cardiovascular Status Stable, Respiratory Function Stable, Patent Airway and No signs of Nausea or vomiting  Last Vitals:  Filed Vitals:   07/28/12 1204  BP: 120/66  Pulse: 60  Temp: 36.4 C  Resp: 16    Post-op Vital Signs: stable   Complications: No apparent anesthesia complications

## 2012-07-28 NOTE — Anesthesia Preprocedure Evaluation (Addendum)
Anesthesia Evaluation  Patient identified by MRN, date of birth, ID band Patient awake    Reviewed: Allergy & Precautions, H&P , NPO status , Patient's Chart, lab work & pertinent test results  Airway Mallampati: III TM Distance: >3 FB Neck ROM: full    Dental  (+) Chipped and Dental Advisory Given,    Pulmonary sleep apnea ,  Probable sleep apnea but no official diagnosis breath sounds clear to auscultation  Pulmonary exam normal       Cardiovascular Exercise Tolerance: Good hypertension, Pt. on medications Rhythm:regular Rate:Normal     Neuro/Psych negative neurological ROS  negative psych ROS   GI/Hepatic Neg liver ROS, hiatal hernia, GERD-  Medicated and Controlled,Fatty liver Dysphagia - unspecified   Endo/Other  negative endocrine ROS  Renal/GU negative Renal ROS  negative genitourinary   Musculoskeletal   Abdominal   Peds  Hematology negative hematology ROS (+)   Anesthesia Other Findings   Reproductive/Obstetrics negative OB ROS                          Anesthesia Physical Anesthesia Plan  ASA: II  Anesthesia Plan: General   Post-op Pain Management:    Induction: Intravenous  Airway Management Planned: Oral ETT  Additional Equipment:   Intra-op Plan:   Post-operative Plan: Extubation in OR  Informed Consent: I have reviewed the patients History and Physical, chart, labs and discussed the procedure including the risks, benefits and alternatives for the proposed anesthesia with the patient or authorized representative who has indicated his/her understanding and acceptance.   Dental Advisory Given  Plan Discussed with: CRNA and Surgeon  Anesthesia Plan Comments:         Anesthesia Quick Evaluation

## 2012-07-28 NOTE — H&P (Signed)
TOTAL HIP ADMISSION H&P  Patient is admitted for left total hip arthroplasty.  Subjective:  Chief Complaint: left hip pain  HPI: Jonathan Grimes, 56 y.o. male, has a history of pain and functional disability in the left hip(s) due to arthritis and patient has failed non-surgical conservative treatments for greater than 12 weeks to include NSAID's and/or analgesics, corticosteriod injections, weight reduction as appropriate and activity modification.  Onset of symptoms was gradual starting 5 years ago with gradually worsening course since that time.The patient noted no past surgery on the left hip(s).  Patient currently rates pain in the left hip at 8 out of 10 with activity. Patient has night pain, worsening of pain with activity and weight bearing, pain that interfers with activities of daily living and pain with passive range of motion. Patient has evidence of subchondral cysts, subchondral sclerosis, periarticular osteophytes and joint space narrowing by imaging studies. This condition presents safety issues increasing the risk of falls.  There is no current active infection.  Patient Active Problem List   Diagnosis Date Noted  . Degenerative arthritis of hip 07/28/2012  . GERD 01/01/2010  . HEMATOCHEZIA 01/01/2010  . DYSPHAGIA UNSPECIFIED 01/01/2010   Past Medical History  Diagnosis Date  . Hypertension   . Elevated cholesterol   . GERD (gastroesophageal reflux disease)   . H/O hiatal hernia   . Arthritis     SEVERE OA LEFT HIP WITH PAIN  . Fatty liver     Past Surgical History  Procedure Date  . Joint replacement 2012    RIGHT TOTAL KNEE ARTHROPLASTY    Prescriptions prior to admission  Medication Sig Dispense Refill  . atorvastatin (LIPITOR) 10 MG tablet Take 10 mg by mouth daily before breakfast.      . fish oil-omega-3 fatty acids 1000 MG capsule Take 3 g by mouth daily.      Marland Kitchen HYDROcodone-acetaminophen (NORCO) 10-325 MG per tablet Take 1 tablet by mouth every 4 (four)  hours as needed. Pain      . omeprazole (PRILOSEC) 40 MG capsule Take 40 mg by mouth daily.      Marland Kitchen aspirin EC 81 MG tablet Take 81 mg by mouth daily.      . bisoprolol-hydrochlorothiazide (ZIAC) 10-6.25 MG per tablet Take 1 tablet by mouth daily before breakfast.       No Known Allergies  History  Substance Use Topics  . Smoking status: Former Games developer  . Smokeless tobacco: Former Neurosurgeon     Comment: QUIT SMOKING 34 YRS AGO  QUIT CHEWING TOBACCO JAN 2013  . Alcohol Use: No    History reviewed. No pertinent family history.   Review of Systems  Musculoskeletal: Positive for joint pain.  All other systems reviewed and are negative.    Objective:  Physical Exam  Constitutional: He is oriented to person, place, and time. He appears well-developed and well-nourished.  HENT:  Head: Normocephalic and atraumatic.  Eyes: EOM are normal. Pupils are equal, round, and reactive to light.  Neck: Normal range of motion. Neck supple.  Cardiovascular: Normal rate and regular rhythm.   Respiratory: Effort normal and breath sounds normal.  GI: Soft. Bowel sounds are normal.  Musculoskeletal:       Left hip: He exhibits decreased range of motion, decreased strength and bony tenderness.  Neurological: He is alert and oriented to person, place, and time.  Skin: Skin is warm and dry.  Psychiatric: He has a normal mood and affect.    Vital signs in  last 24 hours: Temp:  [98.7 F (37.1 C)] 98.7 F (37.1 C) (11/29 0722) Pulse Rate:  [71] 71  (11/29 0722) Resp:  [18] 18  (11/29 0722) BP: (152)/(93) 152/93 mmHg (11/29 0722) SpO2:  [98 %] 98 % (11/29 0722)  Labs:   Estimated Body mass index is 34.83 kg/(m^2) as calculated from the following:   Height as of 01/01/10: 6\' 1" (1.854 m).   Weight as of 01/01/10: 264 lb(119.75 kg).   Imaging Review Plain radiographs demonstrate moderate degenerative joint disease of the left hip(s). The bone quality appears to be excellent for age and reported activity  level.  Assessment/Plan:  End stage arthritis, left hip(s)  The patient history, physical examination, clinical judgement of the provider and imaging studies are consistent with end stage degenerative joint disease of the left hip(s) and total hip arthroplasty is deemed medically necessary. The treatment options including medical management, injection therapy, arthroscopy and arthroplasty were discussed at length. The risks and benefits of total hip arthroplasty were presented and reviewed. The risks due to aseptic loosening, infection, stiffness, dislocation/subluxation,  thromboembolic complications and other imponderables were discussed.  The patient acknowledged the explanation, agreed to proceed with the plan and consent was signed. Patient is being admitted for inpatient treatment for surgery, pain control, PT, OT, prophylactic antibiotics, VTE prophylaxis, progressive ambulation and ADL's and discharge planning.The patient is planning to be discharged home with home health services

## 2012-07-28 NOTE — Op Note (Signed)
NAMEMarland Kitchen  DRISTON, ADERHOLT NO.:  [id number]  MEDICAL RECORD NO.:  [id number]  LOCATION:  1601                         FACILITY:  [facility]  PHYSICIAN:  Vanita Panda. Magnus Ivan, M.D.DATE OF BIRTH:  [birth year]  DATE OF PROCEDURE:  07/28/2012 DATE OF DISCHARGE:                              OPERATIVE REPORT   PREOPERATIVE DIAGNOSIS:  Severe degenerative joint disease, left hip.  POSTOPERATIVE DIAGNOSIS:  Avascular necrosis and degenerative joint disease, left hip.  PROCEDURE:  Left total hip arthroplasty through direct anterior approach.  IMPLANTS:  DePuy Sector Gription acetabular component size 56, size 36+ 4 neutral polyethylene liner, size 12 Corail femoral component with standard offset, size 36+ 1.5 ceramic hip ball.  SURGEON:  Vanita Panda. Magnus Ivan, MD  ANESTHESIA:  General.  BLOOD LOSS:  Less than 500 mL.  COMPLICATIONS:  None.  INDICATIONS:  Mr. Haning is a very pleasant 56 year old gentleman who has experienced left hip pain for some time now.  It is affecting his activities of daily living, his mobility.  X-rays were obtained and showed flattening of the femoral head.  The findings were consistent with avascular necrosis and degenerative joint disease.  Due to the failure of conservative treatment, he wished to proceed with hip replacement at this point.  The risks and benefits of surgery have been explained to him in detail, and again given the radiographs consistent more with AVN and collapse of femoral head, he wished to proceed with surgery.  PROCEDURE DESCRIPTION:  After informed consent was obtained, appropriate left hip was marked.  He was brought to the operating room and while he was on a stretcher in supine position, general anesthesia was obtained. A Foley catheter was placed and both feet were placed in in-line skeletal traction using traction boots.  He was then placed supine on the Hana fracture table with both feet in traction  position but no traction applied.  Perineal post was placed as well.  We then assessed the hip fluoroscopically to obtain the hip center as well as the center of the pelvis for leg length measurements.  Next his left hip was prepped and draped with DuraPrep and sterile drapes.  Time-out was called to identify the correct patient, correct left hip.  I then made an incision just inferior and posterior to the anterior superior iliac spine and carried this obliquely down the leg.  I dissected down to the tensor fascia lata and the tensor fascia was divided longitudinally.  I then proceeded with a direct anterior approach to the hip.  A Cobra retractor was placed around the lateral neck and one around the medial neck.  I cauterized the lateral femoral circumflex vessels and then made my hip arthrotomy.  I placed the Cobra retractors within the arthrotomy and found a large joint effusion.  I then made my femoral neck cut proximal to the lesser trochanter with an oscillating saw and completed this with an osteotome.  Next, I placed a corkscrew guide in the femoral head and removed the femoral head in its entirety.  We inspected the femoral head and found that they have significant collapse.  We then placed a bent Hohmann medially and a Cobra retractor laterally around the  acetabulum and cleaned the acetabulum debris.  I again began reaming in 2 mm increments from size 44 reamer all the way up to a size 56 with the last 2 reamers placed under direct fluoroscopy.  I then placed the real size 56 acetabular component which is diffuse Electronics engineer.  I placed the apex hole eliminator guide and the real 36+ 4 neutral polyethylene liner.  Attention was then next turned to the femur.  With the left leg externally rotated, extended and adducted, I gained access to the femoral canal using a box cutting guide as well as a rongeur.  I placed retractors medially and behind the greater trochanter.   I released the lateral capsule.  I then began broaching from a size 8 broach up to a size 12 with 12 giving [place] the most fill of the canal and stability.  I then trialed a standard neck and a 36+ 1.5 hip ball.  We brought the leg back over and up with traction and internal rotation, reduced the hip.  It was stable with internal and external rotation with minimal Shuck.  His leg lengths were measured equal as well.  We then dislocated the hip and placed the real Corail femoral component size 12 with standard offset and the real 36+ 1.5 ceramic hip ball.  We brought the leg back over and up and traction internal rotation reduced it again.  We then irrigated the soft tissues with normal saline solution.  I closed the joint capsule with interrupted #1 Ethibond suture followed by running 0 V-Loc in the tensor fascia, 2-0 Vicryl in subcutaneous tissue, and staples on the skin. Xeroform followed by well-padded sterile dressing was applied.  The patient was taken off the Hana table, awakened, extubated, and taken to recovery room in stable condition.  All final counts correct.  There were no complications noted.     Vanita Panda. Magnus Ivan, M.D.     CYB/MEDQ  D:  07/28/2012  T:  07/28/2012  Job:  [id number]

## 2012-07-28 NOTE — Brief Op Note (Signed)
07/28/2012  11:22 AM  PATIENT:  Dorna Mai  56 y.o. male  PRE-OPERATIVE DIAGNOSIS:  Severe osteoarthritis left hip  POST-OPERATIVE DIAGNOSIS:  Severe osteoarthritis left hip  PROCEDURE:  Procedure(s) (LRB) with comments: TOTAL HIP ARTHROPLASTY ANTERIOR APPROACH (Left)  SURGEON:  Surgeon(s) and Role:    * Kathryne Hitch, MD - Primary  PHYSICIAN ASSISTANT:   ASSISTANTS: none   ANESTHESIA:   general  EBL:  Total I/O In: 2400 [I.V.:2400] Out: 600 [Urine:300; Blood:300]  BLOOD ADMINISTERED:none  DRAINS: none   LOCAL MEDICATIONS USED:  NONE  SPECIMEN:  No Specimen  DISPOSITION OF SPECIMEN:  N/A  COUNTS:  YES  TOURNIQUET:  * No tourniquets in log *  DICTATION: .Other Dictation: Dictation Number [id number]  PLAN OF CARE: Admit to inpatient   PATIENT DISPOSITION:  PACU - hemodynamically stable.   Delay start of Pharmacological VTE agent (>24hrs) due to surgical blood loss or risk of bleeding: no

## 2012-07-28 NOTE — Transfer of Care (Signed)
Immediate Anesthesia Transfer of Care Note  Patient: Jonathan Grimes  Procedure(s) Performed: Procedure(s) (LRB) with comments: TOTAL HIP ARTHROPLASTY ANTERIOR APPROACH (Left)  Patient Location: PACU  Anesthesia Type:General  Level of Consciousness: awake  Airway & Oxygen Therapy: Patient Spontanous Breathing and Patient connected to face mask oxygen  Post-op Assessment: Report given to PACU RN and Post -op Vital signs reviewed and stable  Post vital signs: Reviewed and stable  Complications: No apparent anesthesia complications

## 2012-07-29 LAB — BASIC METABOLIC PANEL
CO2: 27 mEq/L (ref 19–32)
Chloride: 99 mEq/L (ref 96–112)
Creatinine, Ser: 0.72 mg/dL (ref 0.50–1.35)
GFR calc Af Amer: >90 mL/min (ref >90)
Potassium: 4.5 mEq/L (ref 3.5–5.1)
Sodium: 134 mEq/L — ABNORMAL LOW (ref 135–145)

## 2012-07-29 LAB — CBC
MCV: 92.5 fL (ref 78.0–100.0)
Platelets: 163 10*3/uL (ref 150–400)
RBC: 3.61 MIL/uL — ABNORMAL LOW (ref 4.22–5.81)
RDW: 12.9 % (ref 11.5–15.5)
WBC: 11.6 10*3/uL — ABNORMAL HIGH (ref 4.0–10.5)

## 2012-07-29 MED ORDER — HYDROCODONE-ACETAMINOPHEN 10-325 MG PO TABS: 1.0000 | ORAL_TABLET | ORAL | Status: DC | PRN

## 2012-07-29 MED ORDER — ASPIRIN 325 MG PO TBEC: 325.0000 mg | DELAYED_RELEASE_TABLET | Freq: Two times a day (BID) | ORAL | Status: DC

## 2012-07-29 MED ORDER — METHOCARBAMOL 500 MG PO TABS: 500.0000 mg | ORAL_TABLET | Freq: Three times a day (TID) | ORAL | Status: DC

## 2012-07-29 NOTE — Progress Notes (Signed)
Pt stable, scripts, d/c instructions given with questions/concerns voiced by pt or wife.  Pt transported via wheelchair to private vehicle by NT and wife.

## 2012-07-29 NOTE — Progress Notes (Signed)
   CARE MANAGEMENT NOTE 07/29/2012  Patient:  Jonathan Grimes, Jonathan Grimes   Account Number:  [id number]  Date Initiated:  07/29/2012  Documentation initiated by:  Darlyne Russian  Subjective/Objective Assessment:   Patient admitted with left hip pain     Action/Plan:   Progression of care and discharge planning   Anticipated DC Date:  07/29/2012   Anticipated DC Plan:        DC Planning Services  CM consult      [facility] Choice  HOME HEALTH   Choice offered to / List presented to:  C-1 Patient        HH arranged  HH-2 PT      HH agency  [facility]   Status of service:  Completed, signed off Medicare Important Message given?   (If response is "NO", the following Medicare IM given date fields will be blank) Date Medicare IM given:   Date Additional Medicare IM given:    Discharge Disposition:    Per UR Regulation:    If discussed at Long Length of Stay Meetings, dates discussed:    Comments:  07/29/2012  1130 Darlyne Russian RN, [place]  [phone] CM referral:  HH PT  Met with patient regarding home health services, chose [facility].  [facility]:/ faxed referral for [facility] PT

## 2012-07-29 NOTE — Progress Notes (Signed)
Subjective: 1 Day Post-Op Procedure(s) (LRB): TOTAL HIP ARTHROPLASTY ANTERIOR APPROACH (Left) Awake, alert, Ox4 Up with PT yesterday.  Can I go home today?  Patient reports pain as mild.    Objective:   VITALS:  Temp:  [97.5 F (36.4 C)-99.2 F (37.3 C)] 98.2 F (36.8 C) (11/30 0619) Pulse Rate:  [53-77] 60  (11/30 0619) Resp:  [14-22] 16  (11/30 0800) BP: (95-134)/(51-99) 111/56 mmHg (11/30 0619) SpO2:  [92 %-100 %] 94 % (11/30 0800) Weight:  [117.028 kg (258 lb)] 117.028 kg (258 lb) (11/29 1204)  Neurologically intact ABD soft Neurovascular intact Sensation intact distally Intact pulses distally Dorsiflexion/Plantar flexion intact Incision: scant drainage No cellulitis present Compartment soft   LABS  Basename 07/29/12 0530  HGB 11.1*  WBC 11.6*  PLT 163    Basename 07/29/12 0530  NA 134*  K 4.5  CL 99  CO2 27  BUN 13  CREATININE 0.72  GLUCOSE 143*   No results found for this basename: LABPT:2,INR:2 in the last 72 hours   Assessment/Plan: 1 Day Post-Op Procedure(s) (LRB): TOTAL HIP ARTHROPLASTY ANTERIOR APPROACH (Left) Anemia of acute blood loss.  Advance diet Up with therapy D/C IV fluids Plan for discharge today. Discharge home with home health  NITKA,JAMES E 07/29/2012, 10:33 AM

## 2012-07-29 NOTE — Progress Notes (Signed)
OT Note:   Pt screened for OT.  No needs at this time.  Pt has a high commode at home.  [place], OTR/L [phone] 07/29/2012

## 2012-07-29 NOTE — Progress Notes (Signed)
Physical Therapy Treatment Patient Details Name: Andrius Levenhagen MRN: [id number] DOB: 02-23-1956 Today's Date: 07/29/2012 Time: 7829-5621 PT Time Calculation (min): 31 min  PT Assessment / Plan / Recommendation Comments on Treatment Session   POD # 1 Direct Anterior Approach THR L LE.  Pt progressing very well and plans to D/C to home today.  Pt and family given handout on HEP, instructed on car transfers, instructed on sleeping positions, instructed on use of ICE and instructed on activity level.                                 Frequency     Plan      Precautions / Restrictions Precautions Precautions: None Precaution Comments: Direct Anterior THR Restrictions Weight Bearing Restrictions: No Other Position/Activity Restrictions: WBAT    Pertinent Vitals/Pain C/o "tightness", "no pain really"    Mobility  Bed Mobility Bed Mobility: Not assessed Details for Bed Mobility Assistance: OOB in recliner Transfers Transfers: Sit to Stand;Stand to Sit Sit to Stand: 6: Modified independent (Device/Increase time);From chair/3-in-1 Stand to Sit: 6: Modified independent (Device/Increase time);To chair/3-in-1 Details for Transfer Assistance: good use of hands and safety cognition Ambulation/Gait Ambulation/Gait Assistance: 6: Modified independent (Device/Increase time) Ambulation Distance (Feet): 255 Feet Assistive device: Rolling walker Ambulation/Gait Assistance Details: One VC on safety with backward gait otherwise good alternating gait and proper use RW. Gait Pattern: Step-through pattern Stairs: Yes Stairs Assistance: 5: Supervision Stairs Assistance Details (indicate cue type and reason): 2 steps forward able to reach B rails with one VC needed on sequencing, otherwise performed very well. Stair Management Technique: Two rails;Forwards Number of Stairs: 2     Exercises Total Joint Exercises Ankle Circles/Pumps: AROM;Both;10 reps;Supine Quad Sets: AROM;Both;10  reps;Supine Gluteal Sets: AROM;Both;10 reps;Supine Short Arc Quad: AROM;Left;10 reps;Supine Heel Slides: AROM;Left;10 reps;Supine Hip ABduction/ADduction: AROM;Left;10 reps;Supine Long Arc Quad: AROM;Left;10 reps;Seated    PT Goals                                                              Progressing     Visit Information  Last PT Received On: 07/29/12 Assistance Needed: +1    Subjective Data  Subjective: I feel great, I want to go home Patient Stated Goal: home today   Felecia Shelling  PTA [facility]  Acute  Rehab Pager     [phone]

## 2012-07-31 ENCOUNTER — Encounter (HOSPITAL_COMMUNITY): Admission: RE | Admit: 2012-07-31 | Payer: BC Managed Care – PPO | Source: Ambulatory Visit

## 2012-07-31 NOTE — Discharge Summary (Signed)
Patient ID: Jonathan Grimes MRN: [id number] DOB/AGE: 1956/01/09 56 y.o.  Admit date: 07/28/2012 Discharge date: 07/31/2012  Admission Diagnoses:  Principal Problem:  *Degenerative arthritis of hip   Discharge Diagnoses:  Same  Past Medical History  Diagnosis Date  . Hypertension   . Elevated cholesterol   . GERD (gastroesophageal reflux disease)   . H/O hiatal hernia   . Arthritis     SEVERE OA LEFT HIP WITH PAIN  . Fatty liver     Surgeries: Procedure(s): TOTAL HIP ARTHROPLASTY ANTERIOR APPROACH on 07/28/2012   Consultants:    Discharged Condition: Improved  Hospital Course: Jonathan Grimes is an 56 y.o. male who was admitted 07/28/2012 for operative treatment ofDegenerative arthritis of hip. Patient has severe unremitting pain that affects sleep, daily activities, and work/hobbies. After pre-op clearance the patient was taken to the operating room on 07/28/2012 and underwent  Procedure(s): TOTAL HIP ARTHROPLASTY ANTERIOR APPROACH.    Patient was given perioperative antibiotics: Anti-infectives     Start     Dose/Rate Route Frequency Ordered Stop   07/28/12 1530   ceFAZolin (ANCEF) IVPB 2 g/50 mL premix        2 g 100 mL/hr over 30 Minutes Intravenous Every 6 hours 07/28/12 1208 07/28/12 2130   07/28/12 0725   ceFAZolin (ANCEF) IVPB 2 g/50 mL premix        2 g 100 mL/hr over 30 Minutes Intravenous 60 min pre-op 07/28/12 0725 07/28/12 0933           Patient was given sequential compression devices, early ambulation, and chemoprophylaxis to prevent DVT.  Patient benefited maximally from hospital stay and there were no complications.    Recent vital signs: No data found.    Recent laboratory studies:  Basename 07/29/12 0530  WBC 11.6*  HGB 11.1*  HCT 33.4*  PLT 163  NA 134*  K 4.5  CL 99  CO2 27  BUN 13  CREATININE 0.72  GLUCOSE 143*  INR --  CALCIUM 8.7     Discharge Medications:     Medication List     As of 07/31/2012 11:44 AM    TAKE  these medications         aspirin 325 MG EC tablet   Take 1 tablet (325 mg total) by mouth 2 (two) times daily.      aspirin EC 81 MG tablet   Take 81 mg by mouth daily.      atorvastatin 10 MG tablet   Commonly known as: LIPITOR   Take 10 mg by mouth daily before breakfast.      bisoprolol-hydrochlorothiazide 10-6.25 MG per tablet   Commonly known as: ZIAC   Take 1 tablet by mouth daily before breakfast.      fish oil-omega-3 fatty acids 1000 MG capsule   Take 3 g by mouth daily.      HYDROcodone-acetaminophen 10-325 MG per tablet   Commonly known as: NORCO   Take 1 tablet by mouth every 4 (four) hours as needed for pain. Pain      methocarbamol 500 MG tablet   Commonly known as: ROBAXIN   Take 1 tablet (500 mg total) by mouth 3 (three) times daily.      omeprazole 40 MG capsule   Commonly known as: PRILOSEC   Take 40 mg by mouth daily.        Diagnostic Studies: Dg Chest 2 View  07/21/2012  *RADIOLOGY REPORT*  Clinical Data: Preop left total hip arthroplasty  CHEST -  2 VIEW  Comparison: None.  Findings: Lungs are clear. No pleural effusion or pneumothorax.  Cardiomediastinal silhouette is within normal limits.  Mild degenerative changes of the visualized thoracolumbar spine.  IMPRESSION: No evidence of acute cardiopulmonary disease.   Original Report Authenticated By: Charline Bills, M.D.    Dg Hip Complete Left  07/28/2012  *RADIOLOGY REPORT*  Clinical Data: Left total hip replacement, anterior approach  LEFT HIP - COMPLETE 2+ VIEW  Comparison: None.  Findings:  Two spot anterior fluoroscopic intraoperative images of the left hip and pelvis are provided for review.  Images demonstrate a left total hip replacement which appears to be in near anatomic alignment.  No definite fracture on this solitary projection.  No definite radiopaque foreign body.  IMPRESSION: Post left total hip replacement without definite evidence of complicating feature.   Original Report  Authenticated By: Tacey Ruiz, MD    Dg Pelvis Portable  07/28/2012  *RADIOLOGY REPORT*  Clinical Data: Postop.  Call report.  PORTABLE PELVIS  Comparison: None.  Findings: Changes of left hip replacement.  Normal AP alignment. No hardware or bony complicating feature.  IMPRESSION: Left hip replacement.  No complicating feature.   Original Report Authenticated By: Charlett Nose, M.D.    Dg Hip Portable 1 View Left  07/28/2012  *RADIOLOGY REPORT*  Clinical Data: Postop.  PORTABLE LEFT HIP - 1 VIEW  Comparison: None.  Findings: Changes of left hip replacement.  Normal alignment.  No hardware complicating feature.  IMPRESSION: Left hip replacement without visible complicating feature.   Original Report Authenticated By: Charlett Nose, M.D.    Dg C-arm 1-60 Min-no Report  07/28/2012  CLINICAL DATA: anterior hip   C-ARM 1-60 MINUTES  Fluoroscopy was utilized by the requesting physician.  No radiographic  interpretation.      Disposition: 06-Home-Health Care Svc      Discharge Orders    Future Orders Please Complete By Expires   Diet - low sodium heart healthy      Call MD / Call 911      Comments:   If you experience chest pain or shortness of breath, CALL 911 and be transported to the hospital emergency room.  If you develope a fever above 101 F, pus (white drainage) or increased drainage or redness at the wound, or calf pain, call your surgeon's office.   Constipation Prevention      Comments:   Drink plenty of fluids.  Prune juice may be helpful.  You may use a stool softener, such as Colace (over the counter) 100 mg twice a day.  Use MiraLax (over the counter) for constipation as needed.   Increase activity slowly as tolerated      Discharge instructions      Comments:   Keep knee incision dry for 5 days post op then may wet while bathing. Therapy daily . Call if fever or chills or increased drainage. Go to ER if acutely short of breath or call for ambulance. Return for follow up in 2  weeks. May full weight bear on the surgical leg unless told otherwise. In house walking for first 2 weeks.   Driving restrictions      Comments:   No driving for 6 weeks   Lifting restrictions      Comments:   No lifting for 4 weeks   Follow the hip precautions as taught in Physical Therapy      Change dressing      Comments:   You may change  your dressing on 08/11/2012, then change the dressing daily with sterile 4 x 4 inch gauze dressing and paper tape.  You may clean the incision with alcohol prior to redressing   TED hose      Comments:   Use stockings (TED hose) for 3 weeks on left leg(s).  You may remove them at night for sleeping.      Follow-up Information    Follow up with Kathryne Hitch, MD. In 2 weeks.   Contact information:   [address] [place] [address] [phone]           Signed: Kathryne Hitch 07/31/2012, 11:44 AM

## 2012-08-01 ENCOUNTER — Encounter (HOSPITAL_COMMUNITY): Payer: Self-pay | Admitting: Orthopaedic Surgery

## 2012-08-01 NOTE — Progress Notes (Signed)
Utilization review completed.  

## 2012-08-08 ENCOUNTER — Inpatient Hospital Stay: Admit: 2012-08-08 | Payer: Self-pay | Admitting: Orthopaedic Surgery

## 2012-08-08 SURGERY — ARTHROPLASTY, HIP, TOTAL, ANTERIOR APPROACH
Anesthesia: General | Laterality: Left

## 2013-12-28 ENCOUNTER — Other Ambulatory Visit (HOSPITAL_COMMUNITY): Payer: Self-pay | Admitting: Orthopaedic Surgery

## 2013-12-28 DIAGNOSIS — M8700 Idiopathic aseptic necrosis of unspecified bone: Secondary | ICD-10-CM

## 2014-01-01 ENCOUNTER — Ambulatory Visit (HOSPITAL_COMMUNITY)
Admission: RE | Admit: 2014-01-01 | Discharge: 2014-01-01 | Disposition: A | Discharge status: Home / Self Care | Payer: BC Managed Care – PPO | Source: Ambulatory Visit | Attending: Orthopaedic Surgery | Admitting: Orthopaedic Surgery

## 2014-01-01 DIAGNOSIS — M25559 Pain in unspecified hip: Secondary | ICD-10-CM | POA: Insufficient documentation

## 2014-01-01 DIAGNOSIS — R9389 Abnormal findings on diagnostic imaging of other specified body structures: Secondary | ICD-10-CM | POA: Insufficient documentation

## 2014-01-01 DIAGNOSIS — M25459 Effusion, unspecified hip: Secondary | ICD-10-CM | POA: Insufficient documentation

## 2014-01-01 DIAGNOSIS — R937 Abnormal findings on diagnostic imaging of other parts of musculoskeletal system: Secondary | ICD-10-CM | POA: Insufficient documentation

## 2014-01-01 DIAGNOSIS — M8700 Idiopathic aseptic necrosis of unspecified bone: Secondary | ICD-10-CM

## 2014-01-23 ENCOUNTER — Ambulatory Visit (HOSPITAL_COMMUNITY)
Admission: RE | Admit: 2014-01-23 | Discharge: 2014-01-23 | Disposition: A | Discharge status: Home / Self Care | Payer: BC Managed Care – PPO | Source: Ambulatory Visit | Attending: Family Medicine | Admitting: Family Medicine

## 2014-01-23 ENCOUNTER — Other Ambulatory Visit (HOSPITAL_COMMUNITY): Payer: Self-pay | Admitting: Family Medicine

## 2014-01-23 DIAGNOSIS — M25579 Pain in unspecified ankle and joints of unspecified foot: Secondary | ICD-10-CM

## 2014-01-23 DIAGNOSIS — R609 Edema, unspecified: Secondary | ICD-10-CM

## 2014-01-23 DIAGNOSIS — M79609 Pain in unspecified limb: Secondary | ICD-10-CM | POA: Insufficient documentation

## 2014-01-23 DIAGNOSIS — M25476 Effusion, unspecified foot: Secondary | ICD-10-CM | POA: Insufficient documentation

## 2014-01-23 DIAGNOSIS — M25473 Effusion, unspecified ankle: Secondary | ICD-10-CM | POA: Insufficient documentation

## 2014-01-23 DIAGNOSIS — M7989 Other specified soft tissue disorders: Secondary | ICD-10-CM | POA: Insufficient documentation

## 2014-02-08 ENCOUNTER — Other Ambulatory Visit (HOSPITAL_COMMUNITY): Payer: Self-pay | Admitting: Orthopaedic Surgery

## 2014-02-08 NOTE — Progress Notes (Signed)
Surgery on 02/22/14.  Preop on 02/14/14 at 100pm.  Need orders in EPIC.  Thank You.

## 2014-02-11 ENCOUNTER — Encounter (HOSPITAL_COMMUNITY): Payer: Self-pay | Admitting: Pharmacy Technician

## 2014-02-12 NOTE — Patient Instructions (Signed)
20     Your procedure is scheduled on:  Friday 02/22/2014  Report to [facility] Main Entrance and follow signs to Short Stay  at  1000 AM.  Call this number if you have problems the night before or morning of surgery: [phone]   Remember:             IF YOU USE CPAP,BRING MASK AND TUBING AM OF SURGERY!             IF YOU DO NOT HAVE YOUR TYPE AND SCREEN DRAWN AT PRE-ADMIT APPOINTMENT, YOU WILL HAVE IT DRAWN AM OF SURGERY!   Do not eat food or drink liquids AFTER MIDNIGHT!  Take these medicines the morning of surgery with A SIP OF WATER: Prilosec    [facility] IS NOT RESPONSIBLE FOR ANY BELONGINGS OR VALUABLES BROUGHT TO HOSPITAL.  Marland Kitchen  Leave suitcase in the car. After surgery it may be brought to your room.  For patients admitted to the hospital, checkout time is 11:00 AM the day of              Discharge.    DO NOT WEAR  JEWELRY,MAKE-UP,LOTIONS,POWDERS,PERFUMES,CONTACTS , DENTURES OR BRIDGEWORK ,AND DO NOT WEAR FALSE EYELASHES                                    Patients discharged the day of surgery will not be allowed to drive home.   If going home the same day of surgery, must have someone stay with you first 24 hrs.at home and arrange for someone to drive you home from the [place]: N/A   Special Instructions:              Please read over the following fact sheets that you were given:             1. [place].Tobin Chad     [phone]                              [facility] - Preparing for Surgery Before surgery, you can play an important role.  Because skin is not sterile, your skin needs to be as free of germs as possible.  You can reduce the number of germs on your skin by washing with CHG (chlorahexidine gluconate) soap before surgery.  CHG is an antiseptic cleaner which kills germs and bonds with the  skin to continue killing germs even after washing. Please DO NOT use if you have an allergy to CHG or antibacterial soaps.  If your skin becomes reddened/irritated stop using the CHG and inform your nurse when you arrive at Short Stay. Do not shave (including legs and underarms) for at least 48 hours prior to the first CHG shower.  You may shave your face/neck. Please follow these instructions carefully:  1.  Shower with CHG Soap the  night before surgery and the  morning of Surgery.  2.  If you choose to wash your hair, wash your hair first as usual with your  normal  shampoo.  3.  After you shampoo, rinse your hair and body thoroughly to remove the  shampoo.                           4.  Use CHG as you would any other liquid soap.  You can apply chg directly  to the skin and wash                       Gently with a scrungie or clean washcloth.  5.  Apply the CHG Soap to your body ONLY FROM THE NECK DOWN.   Do not use on face/ open                           Wound or open sores. Avoid contact with eyes, ears mouth and genitals (private parts).                       Wash face,  Genitals (private parts) with your normal soap.             6.  Wash thoroughly, paying special attention to the area where your surgery  will be performed.  7.  Thoroughly rinse your body with warm water from the neck down.  8.  DO NOT shower/wash with your normal soap after using and rinsing off  the CHG Soap.                9.  Pat yourself dry with a clean towel.            10.  Wear clean pajamas.            11.  Place clean sheets on your bed the night of your first shower and do not  sleep with pets. Day of Surgery : Do not apply any lotions/deodorants the morning of surgery.  Please wear clean clothes to the hospital/surgery center.  FAILURE TO FOLLOW THESE INSTRUCTIONS MAY RESULT IN THE CANCELLATION OF YOUR SURGERY PATIENT SIGNATURE_________________________________  NURSE  SIGNATURE__________________________________  ________________________________________________________________________   Adam Phenix  An incentive spirometer is a tool that can help keep your lungs clear and active. This tool measures how well you are filling your lungs with each breath. Taking long deep breaths may help reverse or decrease the chance of developing breathing (pulmonary) problems (especially infection) following:  A long period of time when you are unable to move or be active. BEFORE THE PROCEDURE   If the spirometer includes an indicator to show your best effort, your nurse or respiratory therapist will set it to a desired goal.  If possible, sit up straight or lean slightly forward. Try not to slouch.  Hold the incentive spirometer in an upright position. INSTRUCTIONS FOR USE  1. Sit on the edge of your bed if possible, or sit up as far as you can in bed or on a chair. 2. Hold the incentive spirometer in an upright position. 3. Breathe out normally. 4. Place the mouthpiece in your mouth and seal your lips tightly around it. 5. Breathe in slowly and as deeply as possible, raising the piston or the ball toward the top of the column. 6. Hold your breath for 3-5 seconds  or for as long as possible. Allow the piston or ball to fall to the bottom of the column. 7. Remove the mouthpiece from your mouth and breathe out normally. 8. Rest for a few seconds and repeat Steps 1 through 7 at least 10 times every 1-2 hours when you are awake. Take your time and take a few normal breaths between deep breaths. 9. The spirometer may include an indicator to show your best effort. Use the indicator as a goal to work toward during each repetition. 10. After each set of 10 deep breaths, practice coughing to be sure your lungs are clear. If you have an incision (the cut made at the time of surgery), support your incision when coughing by placing a pillow or rolled up towels firmly  against it. Once you are able to get out of bed, walk around indoors and cough well. You may stop using the incentive spirometer when instructed by your caregiver.  RISKS AND COMPLICATIONS  Take your time so you do not get dizzy or light-headed.  If you are in pain, you may need to take or ask for pain medication before doing incentive spirometry. It is harder to take a deep breath if you are having pain. AFTER USE  Rest and breathe slowly and easily.  It can be helpful to keep track of a log of your progress. Your caregiver can provide you with a simple table to help with this. If you are using the spirometer at home, follow these instructions: [place] IF:   You are having difficultly using the spirometer.  You have trouble using the spirometer as often as instructed.  Your pain medication is not giving enough relief while using the spirometer.  You develop fever of 100.5 F (38.1 C) or higher. SEEK IMMEDIATE MEDICAL CARE IF:   You cough up bloody sputum that had not been present before.  You develop fever of 102 F (38.9 C) or greater.  You develop worsening pain at or near the incision site. MAKE SURE YOU:   Understand these instructions.  Will watch your condition.  Will get help right away if you are not doing well or get worse. Document Released: 12/27/2006 Document Revised: 11/08/2011 Document Reviewed: 02/27/2007 ExitCare Patient Information 2014 Exit[employer], [place].   ________________________________________________________________________  WHAT IS A BLOOD TRANSFUSION? Blood Transfusion Information  A transfusion is the replacement of blood or some of its parts. Blood is made up of multiple cells which provide different functions.  Red blood cells carry oxygen and are used for blood loss replacement.  White blood cells fight against infection.  Platelets control bleeding.  Plasma helps clot blood.  Other blood products are available for  specialized needs, such as hemophilia or other clotting disorders. BEFORE THE TRANSFUSION  Who gives blood for transfusions?   Healthy volunteers who are fully evaluated to make sure their blood is safe. This is blood bank blood. Transfusion therapy is the safest it has ever been in the practice of medicine. Before blood is taken from a donor, a complete history is taken to make sure that person has no history of diseases nor engages in risky social behavior (examples are intravenous drug use or sexual activity with multiple partners). The donor's travel history is screened to minimize risk of transmitting infections, such as malaria. The donated blood is tested for signs of infectious diseases, such as HIV and hepatitis. The blood is then tested to be sure it is compatible with you in order to  minimize the chance of a transfusion reaction. If you or a relative donates blood, this is often done in anticipation of surgery and is not appropriate for emergency situations. It takes many days to process the donated blood. RISKS AND COMPLICATIONS Although transfusion therapy is very safe and saves many lives, the main dangers of transfusion include:   Getting an infectious disease.  Developing a transfusion reaction. This is an allergic reaction to something in the blood you were given. Every precaution is taken to prevent this. The decision to have a blood transfusion has been considered carefully by your caregiver before blood is given. Blood is not given unless the benefits outweigh the risks. AFTER THE TRANSFUSION  Right after receiving a blood transfusion, you will usually feel much better and more energetic. This is especially true if your red blood cells have gotten low (anemic). The transfusion raises the level of the red blood cells which carry oxygen, and this usually causes an energy increase.  The nurse administering the transfusion will monitor you carefully for complications. HOME CARE  INSTRUCTIONS  No special instructions are needed after a transfusion. You may find your energy is better. Speak with your caregiver about any limitations on activity for underlying diseases you may have. SEEK MEDICAL CARE IF:   Your condition is not improving after your transfusion.  You develop redness or irritation at the intravenous (IV) site. SEEK IMMEDIATE MEDICAL CARE IF:  Any of the following symptoms occur over the next 12 hours:  Shaking chills.  You have a temperature by mouth above 102 F (38.9 C), not controlled by medicine.  Chest, back, or muscle pain.  People around you feel you are not acting correctly or are confused.  Shortness of breath or difficulty breathing.  Dizziness and fainting.  You get a rash or develop hives.  You have a decrease in urine output.  Your urine turns a dark color or changes to pink, red, or brown. Any of the following symptoms occur over the next 10 days:  You have a temperature by mouth above 102 F (38.9 C), not controlled by medicine.  Shortness of breath.  Weakness after normal activity.  The white part of the eye turns yellow (jaundice).  You have a decrease in the amount of urine or are urinating less often.  Your urine turns a dark color or changes to pink, red, or brown. Document Released: 08/13/2000 Document Revised: 11/08/2011 Document Reviewed: 04/01/2008 [facility] Patient Information 2014 [place], [place].  _______________________________________________________________________

## 2014-02-14 ENCOUNTER — Ambulatory Visit (HOSPITAL_COMMUNITY)
Admission: RE | Admit: 2014-02-14 | Discharge: 2014-02-14 | Disposition: A | Discharge status: Home / Self Care | Payer: BC Managed Care – PPO | Source: Ambulatory Visit | Attending: Orthopaedic Surgery | Admitting: Orthopaedic Surgery

## 2014-02-14 ENCOUNTER — Encounter (HOSPITAL_COMMUNITY): Payer: Self-pay

## 2014-02-14 ENCOUNTER — Encounter (HOSPITAL_COMMUNITY)
Admission: RE | Admit: 2014-02-14 | Discharge: 2014-02-14 | Disposition: A | Discharge status: Home / Self Care | Payer: BC Managed Care – PPO | Source: Ambulatory Visit | Attending: Orthopaedic Surgery | Admitting: Orthopaedic Surgery

## 2014-02-14 DIAGNOSIS — G4730 Sleep apnea, unspecified: Secondary | ICD-10-CM | POA: Insufficient documentation

## 2014-02-14 DIAGNOSIS — Z01810 Encounter for preprocedural cardiovascular examination: Secondary | ICD-10-CM | POA: Insufficient documentation

## 2014-02-14 DIAGNOSIS — Z01812 Encounter for preprocedural laboratory examination: Secondary | ICD-10-CM | POA: Insufficient documentation

## 2014-02-14 DIAGNOSIS — I10 Essential (primary) hypertension: Secondary | ICD-10-CM | POA: Insufficient documentation

## 2014-02-14 DIAGNOSIS — Z01818 Encounter for other preprocedural examination: Secondary | ICD-10-CM | POA: Insufficient documentation

## 2014-02-14 LAB — URINALYSIS, ROUTINE W REFLEX MICROSCOPIC
Bilirubin Urine: NEGATIVE
GLUCOSE, UA: NEGATIVE mg/dL
Hgb urine dipstick: NEGATIVE
KETONES UR: NEGATIVE mg/dL
Nitrite: NEGATIVE
PROTEIN: NEGATIVE mg/dL
Specific Gravity, Urine: 1.016 (ref 1.005–1.030)
Urobilinogen, UA: 0.2 mg/dL (ref 0.0–1.0)
pH: 5 (ref 5.0–8.0)

## 2014-02-14 LAB — CBC
HEMATOCRIT: 39.5 % (ref 39.0–52.0)
HEMOGLOBIN: 13.6 g/dL (ref 13.0–17.0)
MCH: 30.9 pg (ref 26.0–34.0)
MCHC: 34.4 g/dL (ref 30.0–36.0)
MCV: 89.8 fL (ref 78.0–100.0)
Platelets: 193 10*3/uL (ref 150–400)
RBC: 4.4 MIL/uL (ref 4.22–5.81)
RDW: 12.9 % (ref 11.5–15.5)
WBC: 7.4 10*3/uL (ref 4.0–10.5)

## 2014-02-14 LAB — BASIC METABOLIC PANEL
BUN: 12 mg/dL (ref 6–23)
CHLORIDE: 98 meq/L (ref 96–112)
CO2: 27 mEq/L (ref 19–32)
Calcium: 9.3 mg/dL (ref 8.4–10.5)
Creatinine, Ser: 0.61 mg/dL (ref 0.50–1.35)
GFR calc Af Amer: >90 mL/min (ref >90)
GFR calc non Af Amer: >90 mL/min (ref >90)
GLUCOSE: 114 mg/dL — AB (ref 70–99)
POTASSIUM: 4.1 meq/L (ref 3.7–5.3)
Sodium: 137 mEq/L (ref 137–147)

## 2014-02-14 LAB — SURGICAL PCR SCREEN
MRSA, PCR: NEGATIVE
Staphylococcus aureus: NEGATIVE

## 2014-02-14 LAB — PROTIME-INR
INR: 1.01 (ref 0.00–1.49)
Prothrombin Time: 13.1 seconds (ref 11.6–15.2)

## 2014-02-14 LAB — APTT: APTT: 25 s (ref 24–37)

## 2014-02-14 LAB — URINE MICROSCOPIC-ADD ON

## 2014-02-14 NOTE — Progress Notes (Signed)
02/14/14 1325  OBSTRUCTIVE SLEEP APNEA  Have you ever been diagnosed with sleep apnea through a sleep study? No  Do you snore loudly (loud enough to be heard through closed doors)?  1  Do you often feel tired, fatigued, or sleepy during the daytime? 1  Has anyone observed you stop breathing during your sleep? 1  Do you have, or are you being treated for high blood pressure? 1  BMI more than 35 kg/m2? 1  Age over 59 years old? 1  Neck circumference greater than 40 cm/16 inches? 1  Gender: 1  Obstructive Sleep Apnea Score 8  Score 4 or greater  Results sent to PCP

## 2014-02-21 MED ORDER — DEXTROSE 5 % IV SOLN
3.0000 g | INTRAVENOUS | Status: AC
  Administered 2014-02-22: 3 g via INTRAVENOUS
  Filled 2014-02-21: qty 3000

## 2014-02-22 ENCOUNTER — Inpatient Hospital Stay (HOSPITAL_COMMUNITY): Payer: BC Managed Care – PPO

## 2014-02-22 ENCOUNTER — Encounter (HOSPITAL_COMMUNITY): Payer: Self-pay | Admitting: *Deleted

## 2014-02-22 ENCOUNTER — Encounter (HOSPITAL_COMMUNITY): Payer: BC Managed Care – PPO | Admitting: Anesthesiology

## 2014-02-22 ENCOUNTER — Inpatient Hospital Stay (HOSPITAL_COMMUNITY): Payer: BC Managed Care – PPO | Admitting: Anesthesiology

## 2014-02-22 ENCOUNTER — Inpatient Hospital Stay (HOSPITAL_COMMUNITY)
Admission: RE | Admit: 2014-02-22 | Discharge: 2014-02-23 | DRG: 470 | Disposition: A | Discharge status: Home Health | Payer: BC Managed Care – PPO | Source: Ambulatory Visit | Attending: Orthopaedic Surgery | Admitting: Orthopaedic Surgery

## 2014-02-22 ENCOUNTER — Encounter (HOSPITAL_COMMUNITY)
Admission: RE | Disposition: A | Discharge status: Home Health | Payer: Self-pay | Source: Ambulatory Visit | Attending: Orthopaedic Surgery

## 2014-02-22 DIAGNOSIS — Z96649 Presence of unspecified artificial hip joint: Secondary | ICD-10-CM

## 2014-02-22 DIAGNOSIS — K449 Diaphragmatic hernia without obstruction or gangrene: Secondary | ICD-10-CM | POA: Diagnosis present

## 2014-02-22 DIAGNOSIS — M1611 Unilateral primary osteoarthritis, right hip: Secondary | ICD-10-CM

## 2014-02-22 DIAGNOSIS — Z87891 Personal history of nicotine dependence: Secondary | ICD-10-CM

## 2014-02-22 DIAGNOSIS — M169 Osteoarthritis of hip, unspecified: Secondary | ICD-10-CM | POA: Diagnosis present

## 2014-02-22 DIAGNOSIS — Z96659 Presence of unspecified artificial knee joint: Secondary | ICD-10-CM

## 2014-02-22 DIAGNOSIS — Z6834 Body mass index (BMI) 34.0-34.9, adult: Secondary | ICD-10-CM

## 2014-02-22 DIAGNOSIS — M87059 Idiopathic aseptic necrosis of unspecified femur: Principal | ICD-10-CM | POA: Diagnosis present

## 2014-02-22 DIAGNOSIS — K219 Gastro-esophageal reflux disease without esophagitis: Secondary | ICD-10-CM | POA: Diagnosis present

## 2014-02-22 DIAGNOSIS — E7800 Pure hypercholesterolemia, unspecified: Secondary | ICD-10-CM | POA: Diagnosis present

## 2014-02-22 DIAGNOSIS — I10 Essential (primary) hypertension: Secondary | ICD-10-CM | POA: Diagnosis present

## 2014-02-22 DIAGNOSIS — M1610 Unilateral primary osteoarthritis, unspecified hip: Secondary | ICD-10-CM | POA: Diagnosis present

## 2014-02-22 DIAGNOSIS — D649 Anemia, unspecified: Secondary | ICD-10-CM | POA: Diagnosis not present

## 2014-02-22 HISTORY — PX: TOTAL HIP ARTHROPLASTY: SHX124

## 2014-02-22 LAB — TYPE AND SCREEN
ABO/RH(D): O POS
ANTIBODY SCREEN: NEGATIVE

## 2014-02-22 SURGERY — ARTHROPLASTY, HIP, TOTAL, ANTERIOR APPROACH
Anesthesia: General | Site: Hip | Laterality: Right

## 2014-02-22 MED ORDER — 0.9 % SODIUM CHLORIDE (POUR BTL) OPTIME
TOPICAL | Status: DC | PRN
  Administered 2014-02-22: 1000 mL

## 2014-02-22 MED ORDER — METHOCARBAMOL 500 MG PO TABS
500.0000 mg | ORAL_TABLET | Freq: Four times a day (QID) | ORAL | Status: DC | PRN
  Administered 2014-02-23: 500 mg via ORAL
  Filled 2014-02-22: qty 1

## 2014-02-22 MED ORDER — ALUM & MAG HYDROXIDE-SIMETH 200-200-20 MG/5ML PO SUSP: 30.0000 mL | ORAL | Status: DC | PRN

## 2014-02-22 MED ORDER — METOCLOPRAMIDE HCL 5 MG/ML IJ SOLN: 5.0000 mg | Freq: Three times a day (TID) | INTRAMUSCULAR | Status: DC | PRN

## 2014-02-22 MED ORDER — ONDANSETRON HCL 4 MG/2ML IJ SOLN: 4.0000 mg | Freq: Four times a day (QID) | INTRAMUSCULAR | Status: DC | PRN

## 2014-02-22 MED ORDER — ACETAMINOPHEN 650 MG RE SUPP: 650.0000 mg | Freq: Four times a day (QID) | RECTAL | Status: DC | PRN

## 2014-02-22 MED ORDER — METHOCARBAMOL 1000 MG/10ML IJ SOLN
500.0000 mg | Freq: Four times a day (QID) | INTRAVENOUS | Status: DC | PRN
  Administered 2014-02-22: 500 mg via INTRAVENOUS
  Filled 2014-02-22: qty 5

## 2014-02-22 MED ORDER — PHENYLEPHRINE HCL 10 MG/ML IJ SOLN
INTRAMUSCULAR | Status: DC | PRN
  Administered 2014-02-22: 60 ug via INTRAVENOUS
  Administered 2014-02-22: 80 ug via INTRAVENOUS

## 2014-02-22 MED ORDER — ROCURONIUM BROMIDE 100 MG/10ML IV SOLN: INTRAVENOUS | Status: DC | PRN

## 2014-02-22 MED ORDER — MENTHOL 3 MG MT LOZG
1.0000 | LOZENGE | OROMUCOSAL | Status: DC | PRN
  Filled 2014-02-22: qty 9

## 2014-02-22 MED ORDER — ONDANSETRON HCL 4 MG/2ML IJ SOLN
INTRAMUSCULAR | Status: DC | PRN
  Administered 2014-02-22: 4 mg via INTRAVENOUS

## 2014-02-22 MED ORDER — ROCURONIUM BROMIDE 100 MG/10ML IV SOLN
INTRAVENOUS | Status: AC
  Filled 2014-02-22: qty 1

## 2014-02-22 MED ORDER — SUFENTANIL CITRATE 50 MCG/ML IV SOLN
INTRAVENOUS | Status: DC | PRN
  Administered 2014-02-22 (×2): 5 ug via INTRAVENOUS
  Administered 2014-02-22: 10 ug via INTRAVENOUS
  Administered 2014-02-22 (×2): 5 ug via INTRAVENOUS
  Administered 2014-02-22 (×2): 10 ug via INTRAVENOUS

## 2014-02-22 MED ORDER — HYDROMORPHONE HCL PF 1 MG/ML IJ SOLN
INTRAMUSCULAR | Status: AC
  Filled 2014-02-22: qty 1

## 2014-02-22 MED ORDER — BISOPROLOL-HYDROCHLOROTHIAZIDE 10-6.25 MG PO TABS
1.0000 | ORAL_TABLET | Freq: Every morning | ORAL | Status: DC
  Filled 2014-02-22: qty 1

## 2014-02-22 MED ORDER — ASPIRIN EC 325 MG PO TBEC
325.0000 mg | DELAYED_RELEASE_TABLET | Freq: Two times a day (BID) | ORAL | Status: DC
  Administered 2014-02-23: 325 mg via ORAL
  Filled 2014-02-22 (×3): qty 1

## 2014-02-22 MED ORDER — ZOLPIDEM TARTRATE 5 MG PO TABS: 5.0000 mg | ORAL_TABLET | Freq: Every evening | ORAL | Status: DC | PRN

## 2014-02-22 MED ORDER — PROPOFOL 10 MG/ML IV BOLUS
INTRAVENOUS | Status: AC
  Filled 2014-02-22: qty 20

## 2014-02-22 MED ORDER — PROMETHAZINE HCL 25 MG/ML IJ SOLN: 6.2500 mg | INTRAMUSCULAR | Status: DC | PRN

## 2014-02-22 MED ORDER — ATORVASTATIN CALCIUM 10 MG PO TABS
10.0000 mg | ORAL_TABLET | Freq: Every morning | ORAL | Status: DC
  Filled 2014-02-22: qty 1

## 2014-02-22 MED ORDER — PHENOL 1.4 % MT LIQD
1.0000 | OROMUCOSAL | Status: DC | PRN
  Filled 2014-02-22: qty 177

## 2014-02-22 MED ORDER — HYDROMORPHONE HCL PF 1 MG/ML IJ SOLN
1.0000 mg | INTRAMUSCULAR | Status: DC | PRN
  Administered 2014-02-22: 1 mg via INTRAVENOUS
  Filled 2014-02-22: qty 1

## 2014-02-22 MED ORDER — NEOSTIGMINE METHYLSULFATE 10 MG/10ML IV SOLN
INTRAVENOUS | Status: DC | PRN
  Administered 2014-02-22: 4 mg via INTRAVENOUS

## 2014-02-22 MED ORDER — SUCCINYLCHOLINE CHLORIDE 20 MG/ML IJ SOLN
INTRAMUSCULAR | Status: DC | PRN
  Administered 2014-02-22: 120 mg via INTRAVENOUS

## 2014-02-22 MED ORDER — TRANEXAMIC ACID 100 MG/ML IV SOLN
1000.0000 mg | INTRAVENOUS | Status: AC
  Administered 2014-02-22: 1000 mg via INTRAVENOUS
  Filled 2014-02-22: qty 10

## 2014-02-22 MED ORDER — DOCUSATE SODIUM 100 MG PO CAPS
100.0000 mg | ORAL_CAPSULE | Freq: Two times a day (BID) | ORAL | Status: DC
  Administered 2014-02-22: 100 mg via ORAL
  Filled 2014-02-22 (×3): qty 1

## 2014-02-22 MED ORDER — HYDROMORPHONE HCL PF 1 MG/ML IJ SOLN
0.2500 mg | INTRAMUSCULAR | Status: DC | PRN
  Administered 2014-02-22 (×6): 0.5 mg via INTRAVENOUS

## 2014-02-22 MED ORDER — CEFAZOLIN SODIUM 1-5 GM-% IV SOLN
1.0000 g | Freq: Four times a day (QID) | INTRAVENOUS | Status: AC
  Administered 2014-02-22 – 2014-02-23 (×2): 1 g via INTRAVENOUS
  Filled 2014-02-22 (×2): qty 50

## 2014-02-22 MED ORDER — LIDOCAINE HCL (CARDIAC) 20 MG/ML IV SOLN
INTRAVENOUS | Status: AC
  Filled 2014-02-22: qty 5

## 2014-02-22 MED ORDER — FENTANYL CITRATE 0.05 MG/ML IJ SOLN
INTRAMUSCULAR | Status: DC | PRN
Start: 2014-02-22 — End: 2014-02-22
  Administered 2014-02-22: 100 ug via INTRAVENOUS

## 2014-02-22 MED ORDER — FENTANYL CITRATE 0.05 MG/ML IJ SOLN
INTRAMUSCULAR | Status: AC
  Filled 2014-02-22: qty 2

## 2014-02-22 MED ORDER — GLYCOPYRROLATE 0.2 MG/ML IJ SOLN
INTRAMUSCULAR | Status: DC | PRN
  Administered 2014-02-22: .2 mg via INTRAVENOUS
  Administered 2014-02-22: 0.6 mg via INTRAVENOUS

## 2014-02-22 MED ORDER — ACETAMINOPHEN 325 MG PO TABS
650.0000 mg | ORAL_TABLET | Freq: Four times a day (QID) | ORAL | Status: DC | PRN
  Administered 2014-02-23: 650 mg via ORAL
  Filled 2014-02-22: qty 2

## 2014-02-22 MED ORDER — PANTOPRAZOLE SODIUM 40 MG PO TBEC
80.0000 mg | DELAYED_RELEASE_TABLET | Freq: Every day | ORAL | Status: DC
  Filled 2014-02-22: qty 2

## 2014-02-22 MED ORDER — DIPHENHYDRAMINE HCL 12.5 MG/5ML PO ELIX: 12.5000 mg | ORAL_SOLUTION | ORAL | Status: DC | PRN

## 2014-02-22 MED ORDER — SUFENTANIL CITRATE 50 MCG/ML IV SOLN
INTRAVENOUS | Status: AC
  Filled 2014-02-22: qty 1

## 2014-02-22 MED ORDER — ACETAMINOPHEN 10 MG/ML IV SOLN
1000.0000 mg | Freq: Once | INTRAVENOUS | Status: AC
  Administered 2014-02-22: 1000 mg via INTRAVENOUS
  Filled 2014-02-22: qty 100

## 2014-02-22 MED ORDER — SODIUM CHLORIDE 0.9 % IR SOLN
Status: DC | PRN
  Administered 2014-02-22: 1000 mL

## 2014-02-22 MED ORDER — FERROUS SULFATE 325 (65 FE) MG PO TABS
325.0000 mg | ORAL_TABLET | Freq: Three times a day (TID) | ORAL | Status: DC
  Administered 2014-02-23: 325 mg via ORAL
  Filled 2014-02-22 (×4): qty 1

## 2014-02-22 MED ORDER — POLYETHYLENE GLYCOL 3350 17 G PO PACK
17.0000 g | PACK | Freq: Every day | ORAL | Status: DC | PRN
  Filled 2014-02-22: qty 1

## 2014-02-22 MED ORDER — ONDANSETRON HCL 4 MG PO TABS: 4.0000 mg | ORAL_TABLET | Freq: Four times a day (QID) | ORAL | Status: DC | PRN

## 2014-02-22 MED ORDER — MIDAZOLAM HCL 5 MG/5ML IJ SOLN
INTRAMUSCULAR | Status: DC | PRN
  Administered 2014-02-22: 2 mg via INTRAVENOUS

## 2014-02-22 MED ORDER — SODIUM CHLORIDE 0.9 % IV SOLN
INTRAVENOUS | Status: DC
  Administered 2014-02-22: 18:00:00 via INTRAVENOUS

## 2014-02-22 MED ORDER — LACTATED RINGERS IV SOLN
INTRAVENOUS | Status: DC
  Administered 2014-02-22: 1000 mL via INTRAVENOUS
  Administered 2014-02-22 (×2): via INTRAVENOUS

## 2014-02-22 MED ORDER — ROCURONIUM BROMIDE 100 MG/10ML IV SOLN
INTRAVENOUS | Status: DC | PRN
  Administered 2014-02-22: 20 mg via INTRAVENOUS
  Administered 2014-02-22: 40 mg via INTRAVENOUS
  Administered 2014-02-22 (×2): 20 mg via INTRAVENOUS

## 2014-02-22 MED ORDER — BISOPROLOL FUMARATE 10 MG PO TABS
10.0000 mg | ORAL_TABLET | Freq: Once | ORAL | Status: AC
  Administered 2014-02-22: 10 mg via ORAL
  Filled 2014-02-22: qty 1

## 2014-02-22 MED ORDER — METOCLOPRAMIDE HCL 10 MG PO TABS: 5.0000 mg | ORAL_TABLET | Freq: Three times a day (TID) | ORAL | Status: DC | PRN

## 2014-02-22 MED ORDER — PROPOFOL 10 MG/ML IV BOLUS
INTRAVENOUS | Status: DC | PRN
  Administered 2014-02-22: 230 mg via INTRAVENOUS

## 2014-02-22 MED ORDER — OXYCODONE HCL 5 MG PO TABS
5.0000 mg | ORAL_TABLET | ORAL | Status: DC | PRN
  Administered 2014-02-22 – 2014-02-23 (×4): 10 mg via ORAL
  Filled 2014-02-22 (×4): qty 2

## 2014-02-22 MED ORDER — MIDAZOLAM HCL 2 MG/2ML IJ SOLN
INTRAMUSCULAR | Status: AC
  Filled 2014-02-22: qty 2

## 2014-02-22 SURGICAL SUPPLY — 47 items
BAG SPEC THK2 15X12 ZIP CLS (MISCELLANEOUS) ×1
BAG ZIPLOCK 12X15 (MISCELLANEOUS) ×2 IMPLANT
BLADE SAW SGTL 18X1.27X75 (BLADE) ×2 IMPLANT
BLADE SAW SGTL 18X1.27X75MM (BLADE) ×1
CAPT HIP PF COP ×2 IMPLANT
CELLS DAT CNTRL 66122 CELL SVR (MISCELLANEOUS) ×1 IMPLANT
COVER PERINEAL POST (MISCELLANEOUS) ×3 IMPLANT
DRAPE C-ARM 42X120 X-RAY (DRAPES) ×3 IMPLANT
DRAPE STERI IOBAN 125X83 (DRAPES) ×3 IMPLANT
DRAPE U-SHAPE 47X51 STRL (DRAPES) ×9 IMPLANT
DRSG AQUACEL AG ADV 3.5X10 (GAUZE/BANDAGES/DRESSINGS) ×3 IMPLANT
DURAPREP 26ML APPLICATOR (WOUND CARE) ×3 IMPLANT
ELECT BLADE TIP CTD 4 INCH (ELECTRODE) ×3 IMPLANT
ELECT REM PT RETURN 9FT ADLT (ELECTROSURGICAL) ×3
ELECTRODE REM PT RTRN 9FT ADLT (ELECTROSURGICAL) ×1 IMPLANT
FACESHIELD WRAPAROUND (MASK) ×6 IMPLANT
FACESHIELD WRAPAROUND OR TEAM (MASK) ×4 IMPLANT
GAUZE XEROFORM 1X8 LF (GAUZE/BANDAGES/DRESSINGS) ×2 IMPLANT
GLOVE BIO SURGEON STRL SZ7.5 (GLOVE) ×3 IMPLANT
GLOVE BIO SURGEON STRL SZ8 (GLOVE) ×2 IMPLANT
GLOVE BIOGEL PI IND STRL 6.5 (GLOVE) IMPLANT
GLOVE BIOGEL PI IND STRL 8 (GLOVE) ×2 IMPLANT
GLOVE BIOGEL PI IND STRL 8.5 (GLOVE) IMPLANT
GLOVE BIOGEL PI INDICATOR 6.5 (GLOVE) ×2
GLOVE BIOGEL PI INDICATOR 8 (GLOVE) ×6
GLOVE BIOGEL PI INDICATOR 8.5 (GLOVE) ×2
GLOVE ECLIPSE 7.5 STRL STRAW (GLOVE) ×2 IMPLANT
GLOVE ECLIPSE 8.0 STRL XLNG CF (GLOVE) ×3 IMPLANT
GOWN SPEC L3 XXLG W/TWL (GOWN DISPOSABLE) ×2 IMPLANT
GOWN STRL REUS W/TWL XL LVL3 (GOWN DISPOSABLE) ×8 IMPLANT
HANDPIECE INTERPULSE COAX TIP (DISPOSABLE) ×3
HEAD CERAMIC DELTA 36 PLUS 1.5 (Hips) ×2 IMPLANT
HOLDER FOLEY CATH W/STRAP (MISCELLANEOUS) ×2 IMPLANT
KIT BASIN OR (CUSTOM PROCEDURE TRAY) ×3 IMPLANT
PACK TOTAL JOINT (CUSTOM PROCEDURE TRAY) ×3 IMPLANT
RETRACTOR WND ALEXIS 18 MED (MISCELLANEOUS) ×1 IMPLANT
RTRCTR WOUND ALEXIS 18CM MED (MISCELLANEOUS) ×3
SET HNDPC FAN SPRY TIP SCT (DISPOSABLE) ×1 IMPLANT
STAPLER VISISTAT 35W (STAPLE) ×2 IMPLANT
SUT ETHIBOND NAB CT1 #1 30IN (SUTURE) ×3 IMPLANT
SUT MNCRL AB 4-0 PS2 18 (SUTURE) ×3 IMPLANT
SUT VIC AB 0 CT1 36 (SUTURE) ×3 IMPLANT
SUT VIC AB 1 CT1 36 (SUTURE) ×3 IMPLANT
SUT VIC AB 2-0 CT1 27 (SUTURE) ×6
SUT VIC AB 2-0 CT1 TAPERPNT 27 (SUTURE) ×2 IMPLANT
TOWEL OR 17X26 10 PK STRL BLUE (TOWEL DISPOSABLE) ×3 IMPLANT
TRAY FOLEY CATH 16FRSI W/METER (SET/KITS/TRAYS/PACK) IMPLANT

## 2014-02-22 NOTE — Transfer of Care (Signed)
Immediate Anesthesia Transfer of Care Note  Patient: Jonathan Grimes  Procedure(s) Performed: Procedure(s): RIGHT TOTAL HIP ARTHROPLASTY ANTERIOR APPROACH (Right)  Patient Location: PACU  Anesthesia Type:General  Level of Consciousness: awake, alert  and oriented  Airway & Oxygen Therapy: Patient Spontanous Breathing and Patient connected to face mask oxygen  Post-op Assessment: Report given to PACU RN and Post -op Vital signs reviewed and stable  Post vital signs: Reviewed and stable  Complications: No apparent anesthesia complications

## 2014-02-22 NOTE — Brief Op Note (Signed)
02/22/2014  2:50 PM  PATIENT:  Jonathan Grimes  58 y.o. male  PRE-OPERATIVE DIAGNOSIS:  Avascular necrosis right hip  POST-OPERATIVE DIAGNOSIS:  Avascular necrosis right hip  PROCEDURE:  Procedure(s): RIGHT TOTAL HIP ARTHROPLASTY ANTERIOR APPROACH (Right)  SURGEON:  Surgeon(s) and Role:    * Mcarthur Rossetti, MD - Primary  PHYSICIAN ASSISTANT: Benita Stabile, PA-C  ANESTHESIA:   general  EBL:  Total I/O In: 1000 [I.V.:1000] Out: 450 [Blood:450]  BLOOD ADMINISTERED:none  DRAINS: none   LOCAL MEDICATIONS USED:  NONE  SPECIMEN:  No Specimen  DISPOSITION OF SPECIMEN:  N/A  COUNTS:  YES  TOURNIQUET:  * No tourniquets in log *  DICTATION: .Other Dictation: Dictation Number [id number]  PLAN OF CARE: Admit to inpatient   PATIENT DISPOSITION:  PACU - hemodynamically stable.   Delay start of Pharmacological VTE agent (>24hrs) due to surgical blood loss or risk of bleeding: no

## 2014-02-22 NOTE — Anesthesia Preprocedure Evaluation (Addendum)
Anesthesia Evaluation  Patient identified by MRN, date of birth, ID band Patient awake    Reviewed: Allergy & Precautions, H&P , NPO status , Patient's Chart, lab work & pertinent test results  Airway Mallampati: III TM Distance: >3 FB Neck ROM: full    Dental  (+) Chipped, Dental Advisory Given,    Pulmonary sleep apnea , former smoker,  Probable sleep apnea but no official diagnosis breath sounds clear to auscultation  Pulmonary exam normal       Cardiovascular Exercise Tolerance: Good hypertension, Pt. on medications Rhythm:regular Rate:Normal     Neuro/Psych negative neurological ROS  negative psych ROS   GI/Hepatic Neg liver ROS, hiatal hernia, GERD-  Medicated and Controlled,Fatty liver Dysphagia - unspecified   Endo/Other  negative endocrine ROS  Renal/GU negative Renal ROS  negative genitourinary   Musculoskeletal   Abdominal   Peds  Hematology negative hematology ROS (+)   Anesthesia Other Findings   Reproductive/Obstetrics negative OB ROS                          Anesthesia Physical  Anesthesia Plan  ASA: II  Anesthesia Plan: General   Post-op Pain Management:    Induction: Intravenous  Airway Management Planned: Oral ETT  Additional Equipment:   Intra-op Plan:   Post-operative Plan: Extubation in OR  Informed Consent: I have reviewed the patients History and Physical, chart, labs and discussed the procedure including the risks, benefits and alternatives for the proposed anesthesia with the patient or authorized representative who has indicated his/her understanding and acceptance.   Dental advisory given  Plan Discussed with: CRNA  Anesthesia Plan Comments:        Anesthesia Quick Evaluation

## 2014-02-22 NOTE — H&P (Signed)
TOTAL HIP ADMISSION H&P  Patient is admitted for right total hip arthroplasty.  Subjective:  Chief Complaint: right hip pain  HPI: Jonathan Grimes, 58 y.o. male, has a history of pain and functional disability in the right hip(s) due to arthritis and patient has failed non-surgical conservative treatments for greater than 12 weeks to include NSAID's and/or analgesics, corticosteriod injections, flexibility and strengthening excercises, supervised PT with diminished ADL's post treatment, use of assistive devices, weight reduction as appropriate and activity modification.  Onset of symptoms was abrupt starting 1 years ago with rapidlly worsening course since that time.The patient noted no past surgery on the right hip(s).  Patient currently rates pain in the right hip at 10 out of 10 with activity. Patient has night pain, worsening of pain with activity and weight bearing, trendelenberg gait, pain that interfers with activities of daily living and pain with passive range of motion. Patient has evidence of subchondral sclerosis and joint space narrowing by imaging studies. This condition presents safety issues increasing the risk of falls.  There is no current active infection.  Patient Active Problem List   Diagnosis Date Noted  . Arthritis of right hip 02/22/2014  . Degenerative arthritis of hip 07/28/2012  . GERD 01/01/2010  . HEMATOCHEZIA 01/01/2010  . DYSPHAGIA UNSPECIFIED 01/01/2010   Past Medical History  Diagnosis Date  . Hypertension   . Elevated cholesterol   . GERD (gastroesophageal reflux disease)   . H/O hiatal hernia   . Arthritis     SEVERE OA LEFT HIP WITH PAIN  . Fatty liver     Past Surgical History  Procedure Laterality Date  . Joint replacement  2012    RIGHT TOTAL KNEE ARTHROPLASTY  . Total hip arthroplasty  07/28/2012    Procedure: TOTAL HIP ARTHROPLASTY ANTERIOR APPROACH;  Surgeon: Mcarthur Rossetti, MD;  Location: [facility];  Service: Orthopedics;  Laterality:  Left;    No prescriptions prior to admission   No Known Allergies  History  Substance Use Topics  . Smoking status: Former Smoker    Types: Cigarettes    Quit date: 01/06/1982  . Smokeless tobacco: Former Systems developer     Comment: Coram Woodall 2012  . Alcohol Use: Yes     Comment: beer occasionally    No family history on file.   Review of Systems  Musculoskeletal: Positive for joint pain.  All other systems reviewed and are negative.   Objective:  Physical Exam  Constitutional: He is oriented to person, place, and time. He appears well-developed and well-nourished.  HENT:  Head: Normocephalic and atraumatic.  Eyes: EOM are normal. Pupils are equal, round, and reactive to light.  Neck: Normal range of motion. Neck supple.  Cardiovascular: Normal rate and regular rhythm.   Respiratory: Effort normal and breath sounds normal.  GI: Soft. Bowel sounds are normal.  Musculoskeletal:       Right hip: He exhibits decreased range of motion, decreased strength and bony tenderness.  Neurological: He is alert and oriented to person, place, and time.  Skin: Skin is warm and dry.  Psychiatric: He has a normal mood and affect.    Vital signs in last 24 hours:    Labs:   Estimated body mass index is 34.05 kg/(m^2) as calculated from the following:   Height as of 07/28/12: 6\' 1"  (1.854 m).   Weight as of 07/21/12: 117.028 kg (258 lb).   Imaging Review Plain radiographs demonstrate severe degenerative  joint disease of the right hip(s). The bone quality appears to be good for age and reported activity level.  Assessment/Plan:  End stage arthritis, right hip(s)  The patient history, physical examination, clinical judgement of the provider and imaging studies are consistent with end stage degenerative joint disease of the right hip(s) and total hip arthroplasty is deemed medically necessary. The treatment options including medical management,  injection therapy, arthroscopy and arthroplasty were discussed at length. The risks and benefits of total hip arthroplasty were presented and reviewed. The risks due to aseptic loosening, infection, stiffness, dislocation/subluxation,  thromboembolic complications and other imponderables were discussed.  The patient acknowledged the explanation, agreed to proceed with the plan and consent was signed. Patient is being admitted for inpatient treatment for surgery, pain control, PT, OT, prophylactic antibiotics, VTE prophylaxis, progressive ambulation and ADL's and discharge planning.The patient is planning to be discharged home with home health services

## 2014-02-23 LAB — BASIC METABOLIC PANEL
BUN: 8 mg/dL (ref 6–23)
CALCIUM: 8.6 mg/dL (ref 8.4–10.5)
CO2: 29 mEq/L (ref 19–32)
Chloride: 97 mEq/L (ref 96–112)
Creatinine, Ser: 0.67 mg/dL (ref 0.50–1.35)
GFR calc Af Amer: >90 mL/min (ref >90)
GLUCOSE: 148 mg/dL — AB (ref 70–99)
POTASSIUM: 4.3 meq/L (ref 3.7–5.3)
SODIUM: 135 meq/L — AB (ref 137–147)

## 2014-02-23 LAB — CBC
HCT: 36.5 % — ABNORMAL LOW (ref 39.0–52.0)
HEMOGLOBIN: 11.5 g/dL — AB (ref 13.0–17.0)
MCH: 29.1 pg (ref 26.0–34.0)
MCHC: 31.5 g/dL (ref 30.0–36.0)
MCV: 92.4 fL (ref 78.0–100.0)
PLATELETS: 158 10*3/uL (ref 150–400)
RBC: 3.95 MIL/uL — ABNORMAL LOW (ref 4.22–5.81)
RDW: 13.3 % (ref 11.5–15.5)
WBC: 8.4 10*3/uL (ref 4.0–10.5)

## 2014-02-23 MED ORDER — OXYCODONE-ACETAMINOPHEN 5-325 MG PO TABS: 1.0000 | ORAL_TABLET | ORAL | Status: DC | PRN

## 2014-02-23 MED ORDER — METHOCARBAMOL 500 MG PO TABS: 500.0000 mg | ORAL_TABLET | Freq: Four times a day (QID) | ORAL | Status: DC | PRN

## 2014-02-23 MED ORDER — ASPIRIN 325 MG PO TBEC: 325.0000 mg | DELAYED_RELEASE_TABLET | Freq: Two times a day (BID) | ORAL | Status: DC

## 2014-02-23 NOTE — Evaluation (Signed)
Physical Therapy Evaluation Patient Details Name: Jonathan Grimes MRN: [id number] DOB: 1955/12/25 Today's Date: 02/23/2014   History of Present Illness  R THA, anterior approach  Clinical Impression  Pt s/p R THR presents with decreased functional mobility 2* decreased R LE strength/ROM and post op pain.  Pt to d/c home with date with family assist and HHPT    Follow Up Recommendations Home health PT    Equipment Recommendations  None recommended by PT    Recommendations for Other Services       Precautions / Restrictions Precautions Precautions: Fall Restrictions Weight Bearing Restrictions: Yes RLE Weight Bearing: Weight bearing as tolerated      Mobility  Bed Mobility Overal bed mobility: Needs Assistance Bed Mobility: Supine to Sit;Sit to Supine     Supine to sit: Min guard;Supervision Sit to supine: Min guard;Supervision   General bed mobility comments: cues for sequence and use of L LE to self assist  Transfers Overall transfer level: Needs assistance Equipment used: Rolling walker (2 wheeled) Transfers: Sit to/from Stand Sit to Stand: Supervision         General transfer comment: cues for use of UEs to self assist  Ambulation/Gait Ambulation/Gait assistance: Min guard;Supervision Ambulation Distance (Feet): 150 Feet Assistive device: Rolling walker (2 wheeled) Gait Pattern/deviations: Step-through pattern;Decreased step length - right;Decreased step length - left;Shuffle;Trunk flexed     General Gait Details: cues for posture and position from RW  Stairs Stairs: Yes Stairs assistance: Min guard Stair Management: Two rails;Step to pattern;Forwards Number of Stairs: 5 General stair comments: Cues for sequence  Wheelchair Mobility    Modified Rankin (Stroke Patients Only)       Balance Overall balance assessment: Needs assistance Sitting-balance support: No upper extremity supported;Feet supported Sitting balance-Leahy Scale: Good      Standing balance support: Single extremity supported;Bilateral upper extremity supported;During functional activity Standing balance-Leahy Scale: Fair                               Pertinent Vitals/Pain 2/10; premed, ice pack provided    Home Living Family/patient expects to be discharged to:: Private residence Living Arrangements: Spouse/significant other Available Help at Discharge: Family Type of Home: House Home Access: Stairs to enter Entrance Stairs-Rails: Right;Left;Can reach both Entrance Stairs-Number of Steps: 3 Home Layout: One level Home Equipment: [place] - 2 wheels;Cane - single point;Adaptive equipment Additional Comments: Pt familar with ADL A/E and DME from previous ortho surgeries    Prior Function Level of Independence: Independent               Hand Dominance   Dominant Hand: Left    Extremity/Trunk Assessment   Upper Extremity Assessment: Overall WFL for tasks assessed           Lower Extremity Assessment: RLE deficits/detail RLE Deficits / Details: Hip strength 3-/5 with AAROM at hip to 80 flex and 15 abd    Cervical / Trunk Assessment: Normal  Communication   Communication: No difficulties  Cognition Arousal/Alertness: Awake/alert Behavior During Therapy: WFL for tasks assessed/performed Overall Cognitive Status: Within Functional Limits for tasks assessed                      General Comments      Exercises Total Joint Exercises Ankle Circles/Pumps: AROM;Both;15 reps;Supine Quad Sets: AROM;Both;10 reps;Supine Heel Slides: AAROM;Right;Supine;20 reps Hip ABduction/ADduction: AAROM;15 reps;Supine;Right      Assessment/Plan    PT Assessment  Patient needs continued PT services  PT Diagnosis Difficulty walking   PT Problem List Decreased strength;Decreased range of motion;Decreased activity tolerance;Decreased mobility;Decreased knowledge of use of DME;Pain;Obesity  PT Treatment Interventions DME  instruction;Gait training;Stair training;Functional mobility training;Therapeutic activities;Therapeutic exercise;Patient/family education   PT Goals (Current goals can be found in the Care Plan section) Acute Rehab PT Goals Patient Stated Goal: Resume previous lifestyle with decreased PT Goal Formulation: No goals set, d/c therapy (Pt to d/c this date to home)    Frequency 7X/week   Barriers to discharge        Co-evaluation               End of Session   Activity Tolerance: Patient tolerated treatment well Patient left: with call bell/phone within reach;in chair Nurse Communication: Mobility status         Time: 4268-3419 PT Time Calculation (min): 27 min   Charges:   PT Evaluation $Initial PT Evaluation Tier I: 1 Procedure PT Treatments $Gait Training: 8-22 mins $Therapeutic Exercise: 8-22 mins   PT G Codes:          Ferrel Simington 02/23/2014, 1:00 PM

## 2014-02-23 NOTE — Discharge Summary (Signed)
Patient ID: Jonathan Grimes MRN: [id number] DOB/AGE: 59-Apr-1957 59 y.o.  Admit date: 02/22/2014 Discharge date: 02/23/2014  Admission Diagnoses:  Principal Problem:   Arthritis of right hip Active Problems:   Status post THR (total hip replacement)   Discharge Diagnoses:  Status post right total hip arthroplasty Post-op anemia asymptomatic   Past Medical History  Diagnosis Date  . Hypertension   . Elevated cholesterol   . GERD (gastroesophageal reflux disease)   . H/O hiatal hernia   . Arthritis     SEVERE OA LEFT HIP WITH PAIN  . Fatty liver     Surgeries: Procedure(s): RIGHT TOTAL HIP ARTHROPLASTY ANTERIOR APPROACH on 02/22/2014   Consultants:  PT  Discharged Condition: Improved  Hospital Course: Jonathan Grimes is an 58 y.o. male who was admitted 02/22/2014 for operative treatment ofArthritis of right hip. Patient has severe unremitting pain that affects sleep, daily activities, and work/hobbies. After pre-op clearance the patient was taken to the operating room on 02/22/2014 and underwent  Procedure(s): RIGHT TOTAL HIP ARTHROPLASTY ANTERIOR APPROACH.    Patient was given perioperative antibiotics: Anti-infectives   Start     Dose/Rate Route Frequency Ordered Stop   02/22/14 1830  ceFAZolin (ANCEF) IVPB 1 g/50 mL premix     1 g 100 mL/hr over 30 Minutes Intravenous Every 6 hours 02/22/14 1735 02/23/14 0127   02/22/14 0600  ceFAZolin (ANCEF) 3 g in dextrose 5 % 50 mL IVPB     3 g 160 mL/hr over 30 Minutes Intravenous On call to O.R. 02/21/14 1631 02/22/14 1309       Patient was given sequential compression devices, early ambulation, and chemoprophylaxis to prevent DVT.  Patient benefited maximally from hospital stay and there were no complications.    Recent vital signs: Patient Vitals for the past 24 hrs:  BP Temp Temp src Pulse Resp SpO2 Height Weight  02/23/14 0532 121/65 mmHg 98.3 F (36.8 C) Oral 63 16 97 % - -  02/23/14 0100 124/65 mmHg 98.6 F (37 C)  Oral 63 16 97 % - -  02/22/14 2116 126/68 mmHg 98.2 F (36.8 C) Oral 59 16 97 % - -  02/22/14 2000 128/60 mmHg 97.7 F (36.5 C) Oral 62 16 97 % - -  02/22/14 1859 125/68 mmHg 98.1 F (36.7 C) Oral 61 16 98 % - -  02/22/14 1811 129/69 mmHg 97.6 F (36.4 C) Oral 60 14 97 % - -  02/22/14 1700 149/68 mmHg 98.2 F (36.8 C) Oral 65 12 98 % 6\' 1"  (1.854 m) 124.286 kg (274 lb)  02/22/14 1645 122/65 mmHg - - 53 9 95 % - -  02/22/14 1630 132/71 mmHg - - 59 15 96 % - -  02/22/14 1615 128/110 mmHg 97.8 F (36.6 C) - 60 14 93 % - -  02/22/14 1600 150/92 mmHg - - 72 14 94 % - -  02/22/14 1545 150/85 mmHg - - 79 22 98 % - -  02/22/14 1530 171/85 mmHg - - 81 17 97 % - -  02/22/14 1515 147/85 mmHg 98.6 F (37 C) - 84 16 96 % - -     Recent laboratory studies:  Recent Labs  02/23/14 0512  WBC 8.4  HGB 11.5*  HCT 36.5*  PLT 158  NA 135*  K 4.3  CL 97  CO2 29  BUN 8  CREATININE 0.67  GLUCOSE 148*  CALCIUM 8.6     Discharge Medications:     Medication List  aspirin 325 MG EC tablet  Take 1 tablet (325 mg total) by mouth 2 (two) times daily after a meal.     atorvastatin 10 MG tablet  Commonly known as:  LIPITOR  Take 10 mg by mouth every morning.     bisoprolol-hydrochlorothiazide 10-6.25 MG per tablet  Commonly known as:  ZIAC  Take 1 tablet by mouth every morning.     fish oil-omega-3 fatty acids 1000 MG capsule  Take 2 g by mouth every morning.     methocarbamol 500 MG tablet  Commonly known as:  ROBAXIN  Take 1 tablet (500 mg total) by mouth every 6 (six) hours as needed for muscle spasms.     omeprazole 40 MG capsule  Commonly known as:  PRILOSEC  Take 40 mg by mouth daily.     oxyCODONE-acetaminophen 5-325 MG per tablet  Commonly known as:  ROXICET  Take 1-2 tablets by mouth every 4 (four) hours as needed for severe pain.        Diagnostic Studies: Dg Chest 2 View  02/14/2014   CLINICAL DATA:  Preop total hip replacement. History of hypertension and  sleep apnea.  EXAM: CHEST  2 VIEW  COMPARISON:  07/21/2012  FINDINGS: The heart size and mediastinal contours are within normal limits. Both lungs are clear. The visualized skeletal structures are unremarkable.  IMPRESSION: No active cardiopulmonary disease.   Electronically Signed   By: Shon Hale M.D.   On: 02/14/2014 14:07   Dg Hip Complete Right  02/22/2014   CLINICAL DATA:  right anterior hip  EXAM: DG C-ARM 1-60 MIN - NRPT [facility]; RIGHT HIP - COMPLETE 2+ VIEW  COMPARISON:  MR 01/01/2014  FINDINGS: Right hip arthroplasty hardware projects in expected location without fracture. Left hip arthroplasty hardware partially visualized.  IMPRESSION: 1. Right hip arthroplasty   Electronically Signed   By: Arne Cleveland M.D.   On: 02/22/2014 15:03   Dg Pelvis Portable  02/22/2014   CLINICAL DATA:  Postop right hip arthroplasty.  EXAM: PORTABLE PELVIS 1-2 VIEWS  COMPARISON:  Intraoperative fluoroscopic images earlier today  FINDINGS: AP radiograph of the pelvis demonstrates prior bilateral total hip arthroplasties. The prosthetic components appear normally located on this single projection. There is no evidence of periprosthetic fracture. Postoperative soft tissue emphysema is noted adjacent to the right hip.  IMPRESSION: Bilateral total hip arthroplasties as above.   Electronically Signed   By: Logan Bores   On: 02/22/2014 16:06   Dg Hip Portable 1 View Right  02/22/2014   CLINICAL DATA:  Postop.  EXAM: PORTABLE RIGHT HIP - 1 VIEW  COMPARISON:  Intraoperative fluoroscopic images earlier today  FINDINGS: Single portable cross-table lateral radiograph of the right hip demonstrates sequelae of right total hip arthroplasty. Prosthetic femoral and acetabular components are approximated on this single projection. Prior left total hip arthroplasty is partially visualized.  IMPRESSION: Right total hip arthroplasty without evidence of complication on this single projection.   Electronically Signed   By: Logan Bores    On: 02/22/2014 16:05   Dg C-arm 1-60 Min-no Report  02/22/2014   CLINICAL DATA:  right anterior hip  EXAM: DG C-ARM 1-60 MIN - NRPT [facility]; RIGHT HIP - COMPLETE 2+ VIEW  COMPARISON:  MR 01/01/2014  FINDINGS: Right hip arthroplasty hardware projects in expected location without fracture. Left hip arthroplasty hardware partially visualized.  IMPRESSION: 1. Right hip arthroplasty   Electronically Signed   By: Arne Cleveland M.D.   On: 02/22/2014 15:03  Disposition: 06-Home-Health Care Svc      Discharge Instructions   Weight bearing as tolerated    Complete by:  As directed   Laterality:  right  Extremity:  Lower              Signed: [place], [place] 02/23/2014, 9:56 AM

## 2014-02-23 NOTE — Evaluation (Signed)
Occupational Therapy Evaluation Patient Details Name: Jonathan Grimes MRN: [id number] DOB: 03/22/1956 Today's Date: 02/23/2014    History of Present Illness R THA, anterior approach   Clinical Impression   Pt is at min - mod A with LB ADLs and min A - mini guard a with ADL mobility. Pt will have 24 hour sup/A at home and had DME and A/E from previous surgeries. All education completed and no further acute OT services are indicated at this time     Follow Up Recommendations  No OT follow up    Equipment Recommendations  3 in 1 bedside comode    Recommendations for Other Services PT consult     Precautions / Restrictions Precautions Precautions: Anterior Hip Restrictions Weight Bearing Restrictions: Yes RLE Weight Bearing: Weight bearing as tolerated      Mobility Bed Mobility               General bed mobility comments: pt up in recliner upon entering room  Transfers Overall transfer level: Needs assistance Equipment used: Rolling walker (2 wheeled) Transfers: Sit to/from Stand Sit to Stand: Min guard              Balance Overall balance assessment: Needs assistance Sitting-balance support: No upper extremity supported;Feet supported Sitting balance-Leahy Scale: Good     Standing balance support: Single extremity supported;Bilateral upper extremity supported;During functional activity Standing balance-Leahy Scale: Fair                              ADL Overall ADL's : Needs assistance/impaired     Grooming: Wash/dry hands;Wash/dry face;Standing   Upper Body Bathing: Set up;Sitting   Lower Body Bathing: Minimal assistance;Sit to/from stand   Upper Body Dressing : Set up;Sitting   Lower Body Dressing: Minimal assistance;Sit to/from stand;Moderate assistance   Toilet Transfer: Min guard;Comfort height toilet;RW   Toileting- Clothing Manipulation and Hygiene: Minimal assistance   Tub/ Banker: Min guard;Minimal  assistance;Grab bars   Functional mobility during ADLs: Min guard;Minimal assistance General ADL Comments: Reviewed DME and A/E with pt and family; pt familair with from previous ortho surgeries     Vision  wears glasses                   Perception Perception Perception Tested?: No   Praxis Praxis Praxis tested?: Not tested    Pertinent Vitals/Pain 4/10 R LE pain, VSS     Hand Dominance Left   Extremity/Trunk Assessment Upper Extremity Assessment Upper Extremity Assessment: Overall WFL for tasks assessed   Lower Extremity Assessment Lower Extremity Assessment: Defer to PT evaluation   Cervical / Trunk Assessment Cervical / Trunk Assessment: Normal   Communication Communication Communication: No difficulties   Cognition Arousal/Alertness: Awake/alert Behavior During Therapy: WFL for tasks assessed/performed Overall Cognitive Status: Within Functional Limits for tasks assessed                     General Comments   Pt very pleasant and cooperative                 Home Living Family/patient expects to be discharged to:: Private residence Living Arrangements: Spouse/significant other Available Help at Discharge: Family Type of Home: House Home Access: Stairs to enter   Entrance Stairs-Rails: Right;Left Home Layout: One level     Bathroom Shower/Tub: Tub/shower unit Shower/tub characteristics: Curtain Biochemist, clinical: Handicapped height     Home Equipment: Environmental consultant - 2  wheels;Cane - single point;Adaptive equipment Adaptive Equipment: Reacher Additional Comments: Pt familar with ADL A/E and DME from previous ortho surgeries      Prior Functioning/Environment Level of Independence: Independent             OT Diagnosis:     OT Problem List:     OT Treatment/Interventions:      OT Goals(Current goals can be found in the care plan section)    OT Frequency:     Barriers to D/C:  none                        End of  Session Equipment Utilized During Treatment: Other (comment);Rolling walker (3 in 1 over toilet)  Activity Tolerance: Patient tolerated treatment well Patient left: in chair;with call bell/phone within reach;Other (comment) (Physician in to see pt)   Time: 0929-0950 OT Time Calculation (min): 21 min Charges:  OT General Charges $OT Visit: 1 Procedure OT Evaluation $Initial OT Evaluation Tier I: 1 Procedure OT Treatments $Therapeutic Activity: 8-22 mins G-Codes:    Britt Bottom 02/23/2014, 11:21 AM

## 2014-02-23 NOTE — Discharge Instructions (Signed)
Pickup stool softener for constipation. Weight Bearing as tolerated  No hip precautions Progress activities slowly Expect hip and possible knee soreness and swelling. Apply heat or ice as needed. Keep dressing clean dry and intact, may shower with dressing intact. On Friday remove dressing, incision can get wet. After showering apply clean dressing.

## 2014-02-23 NOTE — Care Management Note (Signed)
    Page 1 of 1   02/23/2014     10:57:15 AM CARE MANAGEMENT NOTE 02/23/2014  Patient:  Jonathan Grimes, Jonathan Grimes   Account Number:  [id number]  Date Initiated:  02/23/2014  Documentation initiated by:  [facility]  Subjective/Objective Assessment:   adm: right hip pain     Action/Plan:   discharge planning   Anticipated DC Date:  02/23/2014   Anticipated DC Plan:  [place]  CM consult      [facility] Choice  HOME HEALTH   Choice offered to / List presented to:  C-1 Patient        [place] arranged  [facility]      [place].   Status of service:  Completed, signed off Medicare Important Message given?   (If response is "NO", the following Medicare IM given date fields will be blank) Date Medicare IM given:   Date Additional Medicare IM given:    Discharge Disposition:  [place]  Per UR Regulation:    If discussed at Long Length of Stay Meetings, dates discussed:    Comments:  02/23/14 10:15 CM met with pt in room to offer choice of home health agency.  Pt states he had L hip done previously and had [facility] and was happy to have them again.  No DME needed as pt has walker and 3n1 from previous surgery. Address and contact information verified with pt.  Referral texted to [facility] rep, Kirsten for HHPT.  No other CM needs were communicated.  Mariane Masters, BSN, [place].

## 2014-02-23 NOTE — Progress Notes (Signed)
Subjective: 1 Day Post-Op Procedure(s) (LRB): RIGHT TOTAL HIP ARTHROPLASTY ANTERIOR APPROACH (Right) Patient reports pain as moderate.  Did not sleep well last night, however wanting to go home after PT today. Has done well with PT thus far.  Objective: Vital signs in last 24 hours: Temp:  [97.6 F (36.4 C)-98.6 F (37 C)] 98.3 F (36.8 C) (06/27 0532) Pulse Rate:  [53-84] 63 (06/27 0532) Resp:  [9-22] 16 (06/27 0532) BP: (121-171)/(60-110) 121/65 mmHg (06/27 0532) SpO2:  [93 %-98 %] 97 % (06/27 0532) Weight:  [124.286 kg (274 lb)] 124.286 kg (274 lb) (06/26 1700)  Intake/Output from previous day: 06/26 0701 - 06/27 0700 In: 3950 [P.O.:480; I.V.:3255; IV Piggyback:215] Out: 2700 [Urine:2250; Blood:450] Intake/Output this shift: Total I/O In: 240 [P.O.:240] Out: 650 [Urine:650]   Recent Labs  02/23/14 0512  HGB 11.5*    Recent Labs  02/23/14 0512  WBC 8.4  RBC 3.95*  HCT 36.5*  PLT 158    Recent Labs  02/23/14 0512  NA 135*  K 4.3  CL 97  CO2 29  BUN 8  CREATININE 0.67  GLUCOSE 148*  CALCIUM 8.6   No results found for this basename: LABPT, INR,  in the last 72 hours  Neurologically intact Incision: dressing C/D/I Compartment soft  Assessment/Plan: 1 Day Post-Op Procedure(s) (LRB): RIGHT TOTAL HIP ARTHROPLASTY ANTERIOR APPROACH (Right) Up with therapy Discharge home with home health Follow-up with Dr. Ninfa Linden in 2 weeks  Erskine Emery 02/23/2014, 9:58 AM

## 2014-02-23 NOTE — Progress Notes (Signed)
Patient discharged to home.  Reviewed discharge paperwork with patient and wife, prescriptions for new meds given to wife.  Patient and wife verbalized understanding.  Patient escorted from floor via wheelchair by NT at 12 noon.  Christen Bame RN

## 2014-02-23 NOTE — Op Note (Signed)
NAMEMarland Kitchen  STEPHANO, ARRANTS NO.:  [id number]  MEDICAL RECORD NO.:  [id number]  LOCATION:  30                         FACILITY:  [facility]  PHYSICIAN:  Lind Guest. Ninfa Linden, M.D.DATE OF BIRTH:  [birth year]  DATE OF PROCEDURE:  02/22/2014 DATE OF DISCHARGE:                              OPERATIVE REPORT   PREOPERATIVE DIAGNOSIS:  Severe arthritis, right hip.  POSTOPERATIVE DIAGNOSIS:  Severe avascular necrosis, right hip.  PROCEDURE:  Right total hip arthroplasty through direct anterior approach.  IMPLANTS:  DePuy Sector Gription acetabular component size 56 with apex hole eliminator guide, size 36+ 4 neutral polyethylene liner, size 12 Corail femoral component with standard offset, size 36+ 5 ceramic hip ball.  SURGEON:  Lind Guest. Ninfa Linden, MD  ASSISTANT:  Erskine Emery, PA  ANESTHESIA:  General.  ANTIBIOTICS:  A 3 g IV Ancef.  BLOOD LOSS:  500-800 mL.  COMPLICATIONS:  None.  INDICATIONS:  Mr. Loudermilk is a 58 year old gentleman well known to me. He has had a previous successful left total hip arthroplasty about a year or so ago.  He started to develop debilitating right hip pains but his x-rays did not show significant arthritis.  The pain was getting more severe so we set up for an MRI that showed a large joint effusion, and evidence of avascular necrosis. Due to debilitating and worsening pain, he wished to proceed with a total hip arthroplasty.  The risks and benefits of surgery were explained to him in detail.  It was recommended he proceed with surgery.  He understands the risks of acute blood loss anemia, nerve and vessel injury, fracture, infection, dislocation, DVT. He understands the goals are decreased pain, improved mobility, and overall improved quality of life.  PROCEDURE DESCRIPTION:  After informed consent was obtained, appropriate right hip was marked.  He was brought to the operating room where general anesthesia was obtained while  he was on a stretcher.  Traction boots were placed on both of his feet and he was placed supine on the Hana fracture table with a perineal post in place and both legs in inline skeletal traction with no traction applied.  His right operative hip was then prepped and draped with DuraPrep and sterile drapes.  A time-out was called and he was identified as correct patient, correct right hip.  We then made an incision inferior and posterior to the anterior-superior iliac spine and carried this obliquely down the leg. We dissected down to the tensor fascia lata muscle.  The tensor fascia was divided longitudinally, so we proceeded with direct anterior approach to the hip.  I dissected down the hip joint itself, cauterized the lateral femoral circumflex vessels.  I then opened up the hip capsule in a L-type format finding a large joint effusion.  We placed the Cobra retractors within the hip capsule.  I made my femoral neck cut with the oscillating saw just proximal to the lesser trochanter and completed this with osteotome.  We removed the femoral head in its entirety and found a large wide area of femoral head collapse consistent with avascular necrosis and it was very soft friable cartilage.  We passed the femoral head off.  We then cleaned the  acetabulum debris and placed a bent Hohmann medially, a Cobra retractor laterally.  I then began reaming from size 42 in 2 mm increments up to a size 56.  All reamers were placed under direct visualization, the last reamer under direct fluoroscopy so we could our desired reaming, our inclination, and anteversion.  Once we were pleased with this, we placed a real DePuy Sector Gription acetabular component size 36, the apex hole eliminator guide, and a 36+ 4 neutral polyethylene liner.  Attention was then turned to the femur.  With the leg externally rotated to 100 degrees, extended and adducted, we placed a Mueller retractor medially and a Hohmann  retractor behind the greater trochanter, we released the lateral joint capsule and then used a box cutting osteotome to enter the femoral canal and a rongeur to lateralize.  We then began broaching from a size 8 broach up to a size 12.  With the size 12, we trialed a standard neck and a 36+ 1.5 hip ball, brought the leg back over and up with traction and internal rotation, reduced this in the acetabulum, it was felt stable.  We dislocated the hip and removed the trial components.  We then placed the real Corail femoral component and did notice a small crack in the calcar but did not extend and the stem was in an anteverted position.  I knocked down all the way and it was __________ and I could not get it out so after going with 36+1 5 hip ball I dislocated it easily so I had to remove that hip ball and place a real 36+5 hip ball and rolled the leg back over and up the wound and reduced this in the acetabulum.  This was stable is also the leg lengths were equal.  I felt that the instability may have come from it slightly anteverted stem but I felt good about the positioning of the acetabular component.  We then copiously irrigated the soft tissues with normal saline solution and closed the joint capsule with interrupted #1 Ethibond suture.  We closed the tensor fascia with #1 Vicryl followed by 0 Vicryl in the deep tissue, 2-0 Vicryl in subcutaneous tissue, and staples on the skin. Xeroform and well-padded sterile dressing was applied.  He was taken off the Hana table to the recovery room in stable condition.  All final counts were correct.  There were no complications noted.     Lind Guest. Ninfa Linden, M.D.     CYB/MEDQ  D:  02/22/2014  T:  02/23/2014  Job:  [id number]

## 2014-02-25 ENCOUNTER — Encounter (HOSPITAL_COMMUNITY): Payer: Self-pay | Admitting: Orthopaedic Surgery

## 2014-02-25 NOTE — Anesthesia Postprocedure Evaluation (Signed)
Anesthesia Post Note  Patient: Jonathan Grimes  Procedure(s) Performed: Procedure(s) (LRB): RIGHT TOTAL HIP ARTHROPLASTY ANTERIOR APPROACH (Right)  Anesthesia type: General  Patient location: PACU  Post pain: Pain level controlled  Post assessment: Post-op Vital signs reviewed  Last Vitals: BP 131/76  Pulse 70  Temp(Src) 37.1 C (Oral)  Resp 16  Ht 6\' 1"  (1.854 m)  Wt 274 lb (124.286 kg)  BMI 36.16 kg/m2  SpO2 93%  Post vital signs: Reviewed  Level of consciousness: sedated  Complications: No apparent anesthesia complications

## 2015-05-07 ENCOUNTER — Other Ambulatory Visit (HOSPITAL_COMMUNITY): Payer: Self-pay | Admitting: Orthopaedic Surgery

## 2015-05-07 DIAGNOSIS — M25561 Pain in right knee: Secondary | ICD-10-CM

## 2015-05-07 DIAGNOSIS — T84032S Mechanical loosening of internal right knee prosthetic joint, sequela: Secondary | ICD-10-CM

## 2015-05-14 ENCOUNTER — Encounter (HOSPITAL_COMMUNITY)
Admission: RE | Admit: 2015-05-14 | Discharge: 2015-05-14 | Disposition: A | Discharge status: Home / Self Care | Payer: BLUE CROSS/BLUE SHIELD | Source: Ambulatory Visit | Attending: Orthopaedic Surgery | Admitting: Orthopaedic Surgery

## 2015-05-14 ENCOUNTER — Encounter (HOSPITAL_COMMUNITY): Payer: Self-pay

## 2015-05-14 DIAGNOSIS — R948 Abnormal results of function studies of other organs and systems: Secondary | ICD-10-CM | POA: Diagnosis not present

## 2015-05-14 DIAGNOSIS — X58XXXS Exposure to other specified factors, sequela: Secondary | ICD-10-CM | POA: Insufficient documentation

## 2015-05-14 DIAGNOSIS — T84032S Mechanical loosening of internal right knee prosthetic joint, sequela: Secondary | ICD-10-CM | POA: Insufficient documentation

## 2015-05-14 DIAGNOSIS — M25561 Pain in right knee: Secondary | ICD-10-CM | POA: Insufficient documentation

## 2015-05-14 HISTORY — DX: Type 2 diabetes mellitus without complications: E11.9

## 2015-05-14 MED ORDER — TECHNETIUM TC 99M MEDRONATE IV KIT
25.0000 | PACK | Freq: Once | INTRAVENOUS | Status: AC | PRN
  Administered 2015-05-14: 25 via INTRAVENOUS

## 2015-07-01 ENCOUNTER — Other Ambulatory Visit (HOSPITAL_COMMUNITY): Payer: Self-pay | Admitting: Orthopaedic Surgery

## 2015-07-02 NOTE — Patient Instructions (Addendum)
Everardo Voris  07/04/2015   Your procedure is scheduled on: Friday 07/11/15  Report to [address] [facility] Main  Entrance take [facility]  elevators to 3rd floor to  [place] at 10:30 AM.  Call this number if you have problems the morning of surgery [phone]   Remember: ONLY 1 PERSON MAY GO WITH YOU TO SHORT STAY TO GET  READY MORNING OF [place].  Do not eat food or drink liquids :After Midnight. You may have clear liquids up until 7:00AM the morning of surgery.     Take these medicines the morning of surgery with A SIP OF WATER: Atorvastatin, omeprazole DO NOT TAKE ANY DIABETIC MEDICATIONS DAY OF YOUR SURGERY                               You may not have any metal on your body including hair pins and              piercings  Do not wear jewelry, make-up, lotions, powders or perfumes, deodorant             Do not wear nail polish.  Do not shave  48 hours prior to surgery.              Men may shave face and neck.   Do not bring valuables to the hospital. [place].  Contacts, dentures or bridgework may not be worn into surgery.  Leave suitcase in the car. After surgery it may be brought to your room.     Special Instructions: deep breathing and leg exercises              Please read over the following fact sheets you were given: _____________________________________________________________________             [facility] - Preparing for Surgery Before surgery, you can play an important role.  Because skin is not sterile, your skin needs to be as free of germs as possible.  You can reduce the number of germs on your skin by washing with CHG (chlorahexidine gluconate) soap before surgery.  CHG is an antiseptic cleaner which kills germs and bonds with the skin to continue killing germs even after washing. Please DO NOT use if you have an allergy to CHG or antibacterial soaps.  If your skin becomes  reddened/irritated stop using the CHG and inform your nurse when you arrive at Short Stay. Do not shave (including legs and underarms) for at least 48 hours prior to the first CHG shower.  You may shave your face/neck. Please follow these instructions carefully:  1.  Shower with CHG Soap the night before surgery and the  morning of Surgery.  2.  If you choose to wash your hair, wash your hair first as usual with your  normal  shampoo.  3.  After you shampoo, rinse your hair and body thoroughly to remove the  shampoo.                           4.  Use CHG as you would any other liquid soap.  You can apply chg directly  to the skin and wash  Gently with a scrungie or clean washcloth.  5.  Apply the CHG Soap to your body ONLY FROM THE NECK DOWN.   Do not use on face/ open                           Wound or open sores. Avoid contact with eyes, ears mouth and genitals (private parts).                       Wash face,  Genitals (private parts) with your normal soap.             6.  Wash thoroughly, paying special attention to the area where your surgery  will be performed.  7.  Thoroughly rinse your body with warm water from the neck down.  8.  DO NOT shower/wash with your normal soap after using and rinsing off  the CHG Soap.                9.  Pat yourself dry with a clean towel.            10.  Wear clean pajamas.            11.  Place clean sheets on your bed the night of your first shower and do not  sleep with pets. Day of Surgery : Do not apply any lotions/deodorants the morning of surgery.  Please wear clean clothes to the hospital/surgery center.  FAILURE TO FOLLOW THESE INSTRUCTIONS MAY RESULT IN THE CANCELLATION OF YOUR SURGERY PATIENT SIGNATURE_________________________________  NURSE SIGNATURE__________________________________  ________________________________________________________________________  WHAT IS A BLOOD TRANSFUSION? Blood Transfusion Information  A  transfusion is the replacement of blood or some of its parts. Blood is made up of multiple cells which provide different functions.  Red blood cells carry oxygen and are used for blood loss replacement.  White blood cells fight against infection.  Platelets control bleeding.  Plasma helps clot blood.  Other blood products are available for specialized needs, such as hemophilia or other clotting disorders. BEFORE THE TRANSFUSION  Who gives blood for transfusions?   Healthy volunteers who are fully evaluated to make sure their blood is safe. This is blood bank blood. Transfusion therapy is the safest it has ever been in the practice of medicine. Before blood is taken from a donor, a complete history is taken to make sure that person has no history of diseases nor engages in risky social behavior (examples are intravenous drug use or sexual activity with multiple partners). The donor's travel history is screened to minimize risk of transmitting infections, such as malaria. The donated blood is tested for signs of infectious diseases, such as HIV and hepatitis. The blood is then tested to be sure it is compatible with you in order to minimize the chance of a transfusion reaction. If you or a relative donates blood, this is often done in anticipation of surgery and is not appropriate for emergency situations. It takes many days to process the donated blood. RISKS AND COMPLICATIONS Although transfusion therapy is very safe and saves many lives, the main dangers of transfusion include:   Getting an infectious disease.  Developing a transfusion reaction. This is an allergic reaction to something in the blood you were given. Every precaution is taken to prevent this. The decision to have a blood transfusion has been considered carefully by your caregiver before blood is given. Blood is not given unless the benefits outweigh  the risks. AFTER THE TRANSFUSION  Right after receiving a blood transfusion,  you will usually feel much better and more energetic. This is especially true if your red blood cells have gotten low (anemic). The transfusion raises the level of the red blood cells which carry oxygen, and this usually causes an energy increase.  The nurse administering the transfusion will monitor you carefully for complications. HOME CARE INSTRUCTIONS  No special instructions are needed after a transfusion. You may find your energy is better. Speak with your caregiver about any limitations on activity for underlying diseases you may have. SEEK MEDICAL CARE IF:   Your condition is not improving after your transfusion.  You develop redness or irritation at the intravenous (IV) site. SEEK IMMEDIATE MEDICAL CARE IF:  Any of the following symptoms occur over the next 12 hours:  Shaking chills.  You have a temperature by mouth above 102 F (38.9 C), not controlled by medicine.  Chest, back, or muscle pain.  People around you feel you are not acting correctly or are confused.  Shortness of breath or difficulty breathing.  Dizziness and fainting.  You get a rash or develop hives.  You have a decrease in urine output.  Your urine turns a dark color or changes to pink, red, or brown. Any of the following symptoms occur over the next 10 days:  You have a temperature by mouth above 102 F (38.9 C), not controlled by medicine.  Shortness of breath.  Weakness after normal activity.  The white part of the eye turns yellow (jaundice).  You have a decrease in the amount of urine or are urinating less often.  Your urine turns a dark color or changes to pink, red, or brown. Document Released: 08/13/2000 Document Revised: 11/08/2011 Document Reviewed: 04/01/2008 ExitCare Patient Information 2014 ExitCare, [place].  _______________________________________________________________________   CLEAR LIQUID DIET   Foods Allowed                                                                      Foods Excluded  Coffee and tea, regular and decaf                             liquids that you cannot  Plain Jell-O in any flavor                                             see through such as: Fruit ices (not with fruit pulp)                                     milk, soups, orange juice  Iced Popsicles                                    All solid food Carbonated beverages, regular and diet  Cranberry, grape and apple juices Sports drinks like Gatorade Lightly seasoned clear broth or consume(fat free) Sugar, honey syrup  Sample Menu Breakfast                                Lunch                                     Supper Cranberry juice                    Beef broth                            Chicken broth Jell-O                                     Grape juice                           Apple juice Coffee or tea                        Jell-O                                      Popsicle                                                Coffee or tea                        Coffee or tea  _____________________________________________________________________

## 2015-07-04 ENCOUNTER — Other Ambulatory Visit: Payer: Self-pay

## 2015-07-04 ENCOUNTER — Encounter (HOSPITAL_COMMUNITY)
Admission: RE | Admit: 2015-07-04 | Discharge: 2015-07-04 | Disposition: A | Discharge status: Home / Self Care | Payer: BLUE CROSS/BLUE SHIELD | Source: Ambulatory Visit | Attending: Orthopaedic Surgery | Admitting: Orthopaedic Surgery

## 2015-07-04 ENCOUNTER — Encounter (HOSPITAL_COMMUNITY): Payer: Self-pay

## 2015-07-04 DIAGNOSIS — Z01818 Encounter for other preprocedural examination: Secondary | ICD-10-CM | POA: Insufficient documentation

## 2015-07-04 HISTORY — DX: Sleep apnea, unspecified: G47.30

## 2015-07-04 LAB — BASIC METABOLIC PANEL
Anion gap: 8 (ref 5–15)
BUN: 11 mg/dL (ref 6–20)
CALCIUM: 8.9 mg/dL (ref 8.9–10.3)
CO2: 27 mmol/L (ref 22–32)
CREATININE: 0.74 mg/dL (ref 0.61–1.24)
Chloride: 105 mmol/L (ref 101–111)
GFR calc Af Amer: >60 mL/min (ref >60)
Glucose, Bld: 108 mg/dL — ABNORMAL HIGH (ref 65–99)
Potassium: 4.7 mmol/L (ref 3.5–5.1)
SODIUM: 140 mmol/L (ref 135–145)

## 2015-07-04 LAB — PROTIME-INR
INR: 0.99 (ref 0.00–1.49)
PROTHROMBIN TIME: 13.3 s (ref 11.6–15.2)

## 2015-07-04 LAB — CBC
HCT: 38.9 % — ABNORMAL LOW (ref 39.0–52.0)
Hemoglobin: 12.5 g/dL — ABNORMAL LOW (ref 13.0–17.0)
MCH: 29.4 pg (ref 26.0–34.0)
MCHC: 32.1 g/dL (ref 30.0–36.0)
MCV: 91.5 fL (ref 78.0–100.0)
PLATELETS: 167 10*3/uL (ref 150–400)
RBC: 4.25 MIL/uL (ref 4.22–5.81)
RDW: 13.9 % (ref 11.5–15.5)
WBC: 7 10*3/uL (ref 4.0–10.5)

## 2015-07-04 LAB — SURGICAL PCR SCREEN
MRSA, PCR: NEGATIVE
Staphylococcus aureus: NEGATIVE

## 2015-07-04 LAB — APTT: aPTT: 27 seconds (ref 24–37)

## 2015-07-04 NOTE — Pre-Procedure Instructions (Signed)
Pt to have EKG this pre-op visit. Diabetic protocol initiated. POCT bedside glucose order entered for morning of surgery. Pt to follow up with Dr. Ninfa Linden regarding Aspirin prior to surgery. Stop Bang score is 7. Routed results to PCP.

## 2015-07-04 NOTE — Progress Notes (Signed)
   07/04/15 0824  OBSTRUCTIVE SLEEP APNEA  Have you ever been diagnosed with sleep apnea through a sleep study? No  Do you snore loudly (loud enough to be heard through closed doors)?  1  Do you often feel tired, fatigued, or sleepy during the daytime (such as falling asleep during driving or talking to someone)? 0  Has anyone observed you stop breathing during your sleep? 1  Do you have, or are you being treated for high blood pressure? 1  BMI more than 35 kg/m2? 1  Age > 50 (1-yes) 1  Neck circumference greater than:Male 16 inches or larger, Male 17inches or larger? 1  Male Gender (Yes=1) 1  Obstructive Sleep Apnea Score 7

## 2015-07-10 MED ORDER — CEFAZOLIN SODIUM 10 G IJ SOLR
3.0000 g | INTRAMUSCULAR | Status: AC
  Administered 2015-07-11: 3 g via INTRAVENOUS
  Filled 2015-07-10: qty 3000

## 2015-07-11 ENCOUNTER — Encounter (HOSPITAL_COMMUNITY)
Admission: RE | Disposition: A | Discharge status: Home Health | Payer: Self-pay | Source: Ambulatory Visit | Attending: Orthopaedic Surgery

## 2015-07-11 ENCOUNTER — Inpatient Hospital Stay (HOSPITAL_COMMUNITY): Payer: BLUE CROSS/BLUE SHIELD | Admitting: Certified Registered Nurse Anesthetist

## 2015-07-11 ENCOUNTER — Encounter (HOSPITAL_COMMUNITY): Payer: Self-pay | Admitting: *Deleted

## 2015-07-11 ENCOUNTER — Inpatient Hospital Stay (HOSPITAL_COMMUNITY)
Admission: RE | Admit: 2015-07-11 | Discharge: 2015-07-13 | DRG: 467 | Disposition: A | Discharge status: Home Health | Payer: BLUE CROSS/BLUE SHIELD | Source: Ambulatory Visit | Attending: Orthopaedic Surgery | Admitting: Orthopaedic Surgery

## 2015-07-11 ENCOUNTER — Inpatient Hospital Stay (HOSPITAL_COMMUNITY): Payer: BLUE CROSS/BLUE SHIELD

## 2015-07-11 DIAGNOSIS — I10 Essential (primary) hypertension: Secondary | ICD-10-CM | POA: Diagnosis present

## 2015-07-11 DIAGNOSIS — K219 Gastro-esophageal reflux disease without esophagitis: Secondary | ICD-10-CM | POA: Diagnosis present

## 2015-07-11 DIAGNOSIS — Z96651 Presence of right artificial knee joint: Secondary | ICD-10-CM

## 2015-07-11 DIAGNOSIS — M1612 Unilateral primary osteoarthritis, left hip: Secondary | ICD-10-CM | POA: Diagnosis present

## 2015-07-11 DIAGNOSIS — D62 Acute posthemorrhagic anemia: Secondary | ICD-10-CM | POA: Diagnosis not present

## 2015-07-11 DIAGNOSIS — T84012A Broken internal right knee prosthesis, initial encounter: Secondary | ICD-10-CM

## 2015-07-11 DIAGNOSIS — E669 Obesity, unspecified: Secondary | ICD-10-CM | POA: Diagnosis present

## 2015-07-11 DIAGNOSIS — Z01812 Encounter for preprocedural laboratory examination: Secondary | ICD-10-CM | POA: Diagnosis not present

## 2015-07-11 DIAGNOSIS — G4730 Sleep apnea, unspecified: Secondary | ICD-10-CM | POA: Diagnosis present

## 2015-07-11 DIAGNOSIS — E7800 Pure hypercholesterolemia, unspecified: Secondary | ICD-10-CM | POA: Diagnosis present

## 2015-07-11 DIAGNOSIS — M25561 Pain in right knee: Secondary | ICD-10-CM | POA: Diagnosis present

## 2015-07-11 DIAGNOSIS — Z87891 Personal history of nicotine dependence: Secondary | ICD-10-CM

## 2015-07-11 DIAGNOSIS — E119 Type 2 diabetes mellitus without complications: Secondary | ICD-10-CM | POA: Diagnosis present

## 2015-07-11 DIAGNOSIS — Y792 Prosthetic and other implants, materials and accessory orthopedic devices associated with adverse incidents: Secondary | ICD-10-CM | POA: Diagnosis present

## 2015-07-11 DIAGNOSIS — Z01810 Encounter for preprocedural cardiovascular examination: Secondary | ICD-10-CM

## 2015-07-11 DIAGNOSIS — T84032A Mechanical loosening of internal right knee prosthetic joint, initial encounter: Secondary | ICD-10-CM | POA: Diagnosis present

## 2015-07-11 DIAGNOSIS — K760 Fatty (change of) liver, not elsewhere classified: Secondary | ICD-10-CM | POA: Diagnosis present

## 2015-07-11 DIAGNOSIS — Z96643 Presence of artificial hip joint, bilateral: Secondary | ICD-10-CM | POA: Diagnosis present

## 2015-07-11 DIAGNOSIS — Z6836 Body mass index (BMI) 36.0-36.9, adult: Secondary | ICD-10-CM | POA: Diagnosis not present

## 2015-07-11 DIAGNOSIS — M659 Synovitis and tenosynovitis, unspecified: Secondary | ICD-10-CM | POA: Diagnosis present

## 2015-07-11 HISTORY — PX: TOTAL KNEE REVISION: SHX996

## 2015-07-11 LAB — TYPE AND SCREEN
ABO/RH(D): O POS
Antibody Screen: NEGATIVE

## 2015-07-11 LAB — GLUCOSE, CAPILLARY
GLUCOSE-CAPILLARY: 110 mg/dL — AB (ref 65–99)
GLUCOSE-CAPILLARY: 98 mg/dL (ref 65–99)
Glucose-Capillary: 96 mg/dL (ref 65–99)
Glucose-Capillary: 190 mg/dL — ABNORMAL HIGH (ref 65–99)

## 2015-07-11 SURGERY — TOTAL KNEE REVISION
Anesthesia: Spinal | Site: Knee | Laterality: Right

## 2015-07-11 MED ORDER — INSULIN ASPART 100 UNIT/ML ~~LOC~~ SOLN
0.0000 [IU] | Freq: Three times a day (TID) | SUBCUTANEOUS | Status: DC
  Administered 2015-07-12 – 2015-07-13 (×4): 2 [IU] via SUBCUTANEOUS

## 2015-07-11 MED ORDER — PROMETHAZINE HCL 25 MG/ML IJ SOLN: 6.2500 mg | INTRAMUSCULAR | Status: DC | PRN

## 2015-07-11 MED ORDER — MIDAZOLAM HCL 2 MG/2ML IJ SOLN
INTRAMUSCULAR | Status: AC
  Filled 2015-07-11: qty 4

## 2015-07-11 MED ORDER — PHENYLEPHRINE 40 MCG/ML (10ML) SYRINGE FOR IV PUSH (FOR BLOOD PRESSURE SUPPORT)
PREFILLED_SYRINGE | INTRAVENOUS | Status: AC
  Filled 2015-07-11: qty 10

## 2015-07-11 MED ORDER — SODIUM CHLORIDE 0.9 % IV SOLN
10000.0000 ug | INTRAVENOUS | Status: DC | PRN
  Administered 2015-07-11: 20 ug/min via INTRAVENOUS

## 2015-07-11 MED ORDER — ONDANSETRON HCL 4 MG/2ML IJ SOLN
4.0000 mg | Freq: Four times a day (QID) | INTRAMUSCULAR | Status: DC | PRN
  Administered 2015-07-12: 4 mg via INTRAVENOUS
  Filled 2015-07-11: qty 2

## 2015-07-11 MED ORDER — PHENYLEPHRINE HCL 10 MG/ML IJ SOLN
INTRAMUSCULAR | Status: AC
  Filled 2015-07-11: qty 1

## 2015-07-11 MED ORDER — PNEUMOCOCCAL VAC POLYVALENT 25 MCG/0.5ML IJ INJ
0.5000 mL | INJECTION | INTRAMUSCULAR | Status: AC
  Administered 2015-07-12: 0.5 mL via INTRAMUSCULAR
  Filled 2015-07-11 (×2): qty 0.5

## 2015-07-11 MED ORDER — METFORMIN HCL 500 MG PO TABS
500.0000 mg | ORAL_TABLET | Freq: Two times a day (BID) | ORAL | Status: DC
  Administered 2015-07-11 – 2015-07-13 (×4): 500 mg via ORAL
  Filled 2015-07-11 (×6): qty 1

## 2015-07-11 MED ORDER — METOCLOPRAMIDE HCL 5 MG/ML IJ SOLN
5.0000 mg | Freq: Three times a day (TID) | INTRAMUSCULAR | Status: DC | PRN
  Administered 2015-07-12: 10 mg via INTRAVENOUS
  Filled 2015-07-11: qty 2

## 2015-07-11 MED ORDER — LACTATED RINGERS IV SOLN
INTRAVENOUS | Status: DC | PRN
  Administered 2015-07-11 (×2): via INTRAVENOUS

## 2015-07-11 MED ORDER — DIPHENHYDRAMINE HCL 12.5 MG/5ML PO ELIX
12.5000 mg | ORAL_SOLUTION | ORAL | Status: DC | PRN
  Filled 2015-07-11: qty 5

## 2015-07-11 MED ORDER — HYDROMORPHONE HCL 1 MG/ML IJ SOLN
1.0000 mg | INTRAMUSCULAR | Status: DC | PRN
  Administered 2015-07-11: 1 mg via INTRAVENOUS
  Administered 2015-07-11: 2 mg via INTRAVENOUS
  Filled 2015-07-11: qty 2
  Filled 2015-07-11: qty 1

## 2015-07-11 MED ORDER — ONDANSETRON HCL 4 MG/2ML IJ SOLN
INTRAMUSCULAR | Status: AC
  Filled 2015-07-11: qty 2

## 2015-07-11 MED ORDER — PHENYLEPHRINE HCL 10 MG/ML IJ SOLN
INTRAMUSCULAR | Status: DC | PRN
  Administered 2015-07-11 (×3): 80 ug via INTRAVENOUS

## 2015-07-11 MED ORDER — POLYETHYLENE GLYCOL 3350 17 G PO PACK: 17.0000 g | PACK | Freq: Every day | ORAL | Status: DC | PRN

## 2015-07-11 MED ORDER — ONDANSETRON HCL 4 MG PO TABS: 4.0000 mg | ORAL_TABLET | Freq: Four times a day (QID) | ORAL | Status: DC | PRN

## 2015-07-11 MED ORDER — DOCUSATE SODIUM 100 MG PO CAPS
100.0000 mg | ORAL_CAPSULE | Freq: Two times a day (BID) | ORAL | Status: DC
  Administered 2015-07-11 – 2015-07-13 (×4): 100 mg via ORAL

## 2015-07-11 MED ORDER — HYDROMORPHONE HCL 1 MG/ML IJ SOLN
INTRAMUSCULAR | Status: AC
  Filled 2015-07-11: qty 1

## 2015-07-11 MED ORDER — ACETAMINOPHEN 650 MG RE SUPP: 650.0000 mg | Freq: Four times a day (QID) | RECTAL | Status: DC | PRN

## 2015-07-11 MED ORDER — ALUM & MAG HYDROXIDE-SIMETH 200-200-20 MG/5ML PO SUSP: 30.0000 mL | ORAL | Status: DC | PRN

## 2015-07-11 MED ORDER — FENTANYL CITRATE (PF) 100 MCG/2ML IJ SOLN
INTRAMUSCULAR | Status: AC
  Filled 2015-07-11: qty 4

## 2015-07-11 MED ORDER — PHENOL 1.4 % MT LIQD: 1.0000 | OROMUCOSAL | Status: DC | PRN

## 2015-07-11 MED ORDER — SODIUM CHLORIDE 0.9 % IR SOLN
Status: DC | PRN
  Administered 2015-07-11 (×3): 1000 mL

## 2015-07-11 MED ORDER — CEFAZOLIN SODIUM-DEXTROSE 2-3 GM-% IV SOLR
2.0000 g | Freq: Four times a day (QID) | INTRAVENOUS | Status: AC
  Administered 2015-07-11 – 2015-07-12 (×2): 2 g via INTRAVENOUS
  Filled 2015-07-11 (×2): qty 50

## 2015-07-11 MED ORDER — ZOLPIDEM TARTRATE 5 MG PO TABS: 5.0000 mg | ORAL_TABLET | Freq: Every evening | ORAL | Status: DC | PRN

## 2015-07-11 MED ORDER — PROPOFOL 10 MG/ML IV BOLUS
INTRAVENOUS | Status: AC
  Filled 2015-07-11: qty 20

## 2015-07-11 MED ORDER — 0.9 % SODIUM CHLORIDE (POUR BTL) OPTIME
TOPICAL | Status: DC | PRN
  Administered 2015-07-11: 1000 mL

## 2015-07-11 MED ORDER — METOCLOPRAMIDE HCL 10 MG PO TABS: 5.0000 mg | ORAL_TABLET | Freq: Three times a day (TID) | ORAL | Status: DC | PRN

## 2015-07-11 MED ORDER — ACETAMINOPHEN 325 MG PO TABS: 650.0000 mg | ORAL_TABLET | Freq: Four times a day (QID) | ORAL | Status: DC | PRN

## 2015-07-11 MED ORDER — BUPIVACAINE IN DEXTROSE 0.75-8.25 % IT SOLN
INTRATHECAL | Status: DC | PRN
  Administered 2015-07-11: 2 mL via INTRATHECAL

## 2015-07-11 MED ORDER — MENTHOL 3 MG MT LOZG: 1.0000 | LOZENGE | OROMUCOSAL | Status: DC | PRN

## 2015-07-11 MED ORDER — TRANEXAMIC ACID 1000 MG/10ML IV SOLN
1000.0000 mg | INTRAVENOUS | Status: AC
  Administered 2015-07-11: 1000 mg via INTRAVENOUS
  Filled 2015-07-11: qty 10

## 2015-07-11 MED ORDER — METHOCARBAMOL 1000 MG/10ML IJ SOLN
500.0000 mg | Freq: Four times a day (QID) | INTRAVENOUS | Status: DC | PRN
  Administered 2015-07-11: 500 mg via INTRAVENOUS
  Filled 2015-07-11 (×2): qty 5

## 2015-07-11 MED ORDER — RIVAROXABAN 10 MG PO TABS
10.0000 mg | ORAL_TABLET | Freq: Every day | ORAL | Status: DC
  Administered 2015-07-12 – 2015-07-13 (×2): 10 mg via ORAL
  Filled 2015-07-11 (×3): qty 1

## 2015-07-11 MED ORDER — LOSARTAN POTASSIUM 50 MG PO TABS
100.0000 mg | ORAL_TABLET | Freq: Every day | ORAL | Status: DC
  Administered 2015-07-11 – 2015-07-13 (×3): 100 mg via ORAL
  Filled 2015-07-11 (×3): qty 2

## 2015-07-11 MED ORDER — METHOCARBAMOL 500 MG PO TABS
500.0000 mg | ORAL_TABLET | Freq: Four times a day (QID) | ORAL | Status: DC | PRN
  Administered 2015-07-12 (×2): 500 mg via ORAL
  Filled 2015-07-11 (×2): qty 1

## 2015-07-11 MED ORDER — SODIUM CHLORIDE 0.9 % IV SOLN
INTRAVENOUS | Status: DC
  Administered 2015-07-11 – 2015-07-12 (×2): via INTRAVENOUS

## 2015-07-11 MED ORDER — INSULIN ASPART 100 UNIT/ML ~~LOC~~ SOLN: 0.0000 [IU] | Freq: Every day | SUBCUTANEOUS | Status: DC

## 2015-07-11 MED ORDER — HYDROMORPHONE HCL 1 MG/ML IJ SOLN
0.2500 mg | INTRAMUSCULAR | Status: DC | PRN
  Administered 2015-07-11 (×4): 0.5 mg via INTRAVENOUS

## 2015-07-11 MED ORDER — PANTOPRAZOLE SODIUM 40 MG PO TBEC
80.0000 mg | DELAYED_RELEASE_TABLET | Freq: Every day | ORAL | Status: DC
  Administered 2015-07-12 – 2015-07-13 (×2): 80 mg via ORAL
  Filled 2015-07-11 (×2): qty 2

## 2015-07-11 MED ORDER — PROPOFOL 10 MG/ML IV BOLUS
INTRAVENOUS | Status: AC
  Filled 2015-07-11: qty 40

## 2015-07-11 MED ORDER — ATORVASTATIN CALCIUM 10 MG PO TABS
10.0000 mg | ORAL_TABLET | Freq: Every morning | ORAL | Status: DC
  Administered 2015-07-12 – 2015-07-13 (×2): 10 mg via ORAL
  Filled 2015-07-11 (×2): qty 1

## 2015-07-11 MED ORDER — PROPOFOL 500 MG/50ML IV EMUL
INTRAVENOUS | Status: DC | PRN
  Administered 2015-07-11: 75 ug/kg/min via INTRAVENOUS

## 2015-07-11 MED ORDER — ONDANSETRON HCL 4 MG/2ML IJ SOLN
INTRAMUSCULAR | Status: DC | PRN
  Administered 2015-07-11: 4 mg via INTRAVENOUS

## 2015-07-11 MED ORDER — LIDOCAINE HCL (CARDIAC) 20 MG/ML IV SOLN
INTRAVENOUS | Status: DC | PRN
  Administered 2015-07-11: 50 mg via INTRAVENOUS

## 2015-07-11 MED ORDER — OXYCODONE HCL 5 MG PO TABS
5.0000 mg | ORAL_TABLET | ORAL | Status: DC | PRN
  Administered 2015-07-12 (×3): 10 mg via ORAL
  Administered 2015-07-12: 5 mg via ORAL
  Administered 2015-07-13 (×2): 10 mg via ORAL
  Filled 2015-07-11 (×5): qty 2
  Filled 2015-07-11: qty 1

## 2015-07-11 MED ORDER — MIDAZOLAM HCL 5 MG/5ML IJ SOLN
INTRAMUSCULAR | Status: DC | PRN
  Administered 2015-07-11: 2 mg via INTRAVENOUS

## 2015-07-11 SURGICAL SUPPLY — 61 items
APL SKNCLS STERI-STRIP NONHPOA (GAUZE/BANDAGES/DRESSINGS)
BAG SPEC THK2 15X12 ZIP CLS (MISCELLANEOUS)
BAG ZIPLOCK 12X15 (MISCELLANEOUS) IMPLANT
BANDAGE ELASTIC 6 VELCRO ST LF (GAUZE/BANDAGES/DRESSINGS) ×3 IMPLANT
BASEPLATE LEGION REV TIBIAL (Plate) ×2 IMPLANT
BENZOIN TINCTURE PRP APPL 2/3 (GAUZE/BANDAGES/DRESSINGS) IMPLANT
BLADE SAG 18X100X1.27 (BLADE) ×3 IMPLANT
BSPLAT TIB 6 CMNT REV F TPR KN (Plate) ×1 IMPLANT
CATH COUDE 5CC RIBBED (CATHETERS) IMPLANT
CATH RIBBED COUDE 5CC (CATHETERS)
CEMENT BONE 1-PACK (Cement) ×4 IMPLANT
CEMENT RESTRICTOR DEPUY SZ 3 (Cement) ×2 IMPLANT
CLOSURE WOUND 1/2 X4 (GAUZE/BANDAGES/DRESSINGS)
CLOTH BEACON ORANGE TIMEOUT ST (SAFETY) ×3 IMPLANT
CUFF TOURN SGL QUICK 34 (TOURNIQUET CUFF) ×3
CUFF TRNQT CYL 34X4X40X1 (TOURNIQUET CUFF) ×1 IMPLANT
DRAPE U-SHAPE 47X51 STRL (DRAPES) ×3 IMPLANT
DRSG PAD ABDOMINAL 8X10 ST (GAUZE/BANDAGES/DRESSINGS) ×3 IMPLANT
DURAPREP 26ML APPLICATOR (WOUND CARE) ×3 IMPLANT
ELECT REM PT RETURN 9FT ADLT (ELECTROSURGICAL) ×3
ELECTRODE REM PT RTRN 9FT ADLT (ELECTROSURGICAL) ×1 IMPLANT
EVACUATOR 1/8 PVC DRAIN (DRAIN) IMPLANT
GAUZE SPONGE 4X4 12PLY STRL (GAUZE/BANDAGES/DRESSINGS) ×3 IMPLANT
GAUZE XEROFORM 1X8 LF (GAUZE/BANDAGES/DRESSINGS) ×2 IMPLANT
GAUZE XEROFORM 5X9 LF (GAUZE/BANDAGES/DRESSINGS) IMPLANT
GLOVE BIO SURGEON STRL SZ7.5 (GLOVE) ×3 IMPLANT
GLOVE BIOGEL PI IND STRL 8 (GLOVE) ×2 IMPLANT
GLOVE BIOGEL PI INDICATOR 8 (GLOVE) ×4
GLOVE ECLIPSE 8.0 STRL XLNG CF (GLOVE) ×3 IMPLANT
GOWN STRL REUS W/TWL XL LVL3 (GOWN DISPOSABLE) ×6 IMPLANT
HANDPIECE INTERPULSE COAX TIP (DISPOSABLE) ×3
IMMOBILIZER KNEE 20 (SOFTGOODS) ×3
IMMOBILIZER KNEE 20 THIGH 36 (SOFTGOODS) ×1 IMPLANT
INSERT GENESIS CONS ART15MM5-6 (Insert) ×1 IMPLANT
PACK TOTAL KNEE CUSTOM (KITS) ×3 IMPLANT
PADDING CAST COTTON 6X4 STRL (CAST SUPPLIES) ×4 IMPLANT
PAT COMP GENESIS 7.5 THICK 35 (Knees) ×3 IMPLANT
PATELLA COMP GENES 7.5 THCK 35 (Knees) IMPLANT
PIN TROCAR 3 INCH (PIN) ×8 IMPLANT
PIN TROCAR 5 (PIN) ×8 IMPLANT
POSITIONER SURGICAL ARM (MISCELLANEOUS) ×3 IMPLANT
SET HNDPC FAN SPRY TIP SCT (DISPOSABLE) ×1 IMPLANT
SET PAD KNEE POSITIONER (MISCELLANEOUS) ×3 IMPLANT
SPONGE LAP 18X18 X RAY DECT (DISPOSABLE) IMPLANT
STAPLER VISISTAT 35W (STAPLE) ×2 IMPLANT
STEM STRAIGHT 12X160MM (Stem) ×2 IMPLANT
STRIP CLOSURE SKIN 1/2X4 (GAUZE/BANDAGES/DRESSINGS) IMPLANT
SUT MNCRL AB 4-0 PS2 18 (SUTURE) IMPLANT
SUT VIC AB 0 CT1 36 (SUTURE) ×3 IMPLANT
SUT VIC AB 1 CT1 36 (SUTURE) ×6 IMPLANT
SUT VIC AB 2-0 CT1 27 (SUTURE) ×6
SUT VIC AB 2-0 CT1 TAPERPNT 27 (SUTURE) ×2 IMPLANT
SWAB COLLECTION DEVICE MRSA (MISCELLANEOUS) IMPLANT
SWAB CULTURE ESWAB REG 1ML (MISCELLANEOUS) IMPLANT
TOWER CARTRIDGE SMART MIX (DISPOSABLE) IMPLANT
TRAY FOLEY BAG SILVER LF 16FR (SET/KITS/TRAYS/PACK) IMPLANT
TRAY FOLEY W/METER SILVER 14FR (SET/KITS/TRAYS/PACK) ×3 IMPLANT
TRAY FOLEY W/METER SILVER 16FR (SET/KITS/TRAYS/PACK) ×3 IMPLANT
TUBE KAMVAC SUCTION (TUBING) IMPLANT
WATER STERILE IRR 1500ML POUR (IV SOLUTION) ×3 IMPLANT
WRAP KNEE MAXI GEL POST OP (GAUZE/BANDAGES/DRESSINGS) ×2 IMPLANT

## 2015-07-11 NOTE — Anesthesia Preprocedure Evaluation (Addendum)
Anesthesia Evaluation  Patient identified by MRN, date of birth, ID band Patient awake    Reviewed: Allergy & Precautions, NPO status , Patient's Chart, lab work & pertinent test results  Airway Mallampati: II  TM Distance: >3 FB Neck ROM: Full    Dental no notable dental hx.    Pulmonary sleep apnea , former smoker,  Sleep apnea by family report only.   Pulmonary exam normal breath sounds clear to auscultation       Cardiovascular hypertension, Pt. on medications Normal cardiovascular exam Rhythm:Regular Rate:Normal     Neuro/Psych negative neurological ROS  negative psych ROS   GI/Hepatic Neg liver ROS, hiatal hernia, GERD  Medicated,  Endo/Other  diabetes, Type 2, Oral Hypoglycemic Agents  Renal/GU negative Renal ROS  negative genitourinary   Musculoskeletal  (+) Arthritis ,   Abdominal (+) + obese,   Peds negative pediatric ROS (+)  Hematology negative hematology ROS (+)   Anesthesia Other Findings   Reproductive/Obstetrics negative OB ROS                           Anesthesia Physical Anesthesia Plan  ASA: II  Anesthesia Plan: Spinal   Post-op Pain Management:    Induction: Intravenous  Airway Management Planned: Natural Airway  Additional Equipment:   Intra-op Plan:   Post-operative Plan:   Informed Consent: I have reviewed the patients History and Physical, chart, labs and discussed the procedure including the risks, benefits and alternatives for the proposed anesthesia with the patient or authorized representative who has indicated his/her understanding and acceptance.   Dental advisory given  Plan Discussed with: CRNA  Anesthesia Plan Comments: (Discussed risks and benefits of and differences between spinal and general. Discussed risks of spinal including headache, backache, failure, bleeding and hematoma, infection, and nerve damage. Patient consents to spinal.  Questions answered. Coagulation studies and platelet count acceptable.)       Anesthesia Quick Evaluation

## 2015-07-11 NOTE — Brief Op Note (Signed)
07/11/2015  3:08 PM  PATIENT:  Jonathan Grimes  59 y.o. male  PRE-OPERATIVE DIAGNOSIS:  aseptic loosening right total knee arthroplasty  POST-OPERATIVE DIAGNOSIS:  aseptic loosening right total knee arthroplasty  PROCEDURE:  Procedure(s): REVISION RIGHT TOTAL KNEE ARTHROPLASTYone component only (Right)  SURGEON:  Surgeon(s) and Role:    * Mcarthur Rossetti, MD - Primary  PHYSICIAN ASSISTANT: Benita Stabile, PA-C  ANESTHESIA:   spinal  EBL:  Total I/O In: 1000 [I.V.:1000] Out: 100 [Blood:100]  BLOOD ADMINISTERED:none  DRAINS: (medium) Hemovact drain(s) in the knee joint with  Suction Open   LOCAL MEDICATIONS USED:  NONE  SPECIMEN:  No Specimen  DISPOSITION OF SPECIMEN:  N/A  COUNTS:  YES  TOURNIQUET:   Total Tourniquet Time Documented: Thigh (Right) - 87 minutes Total: Thigh (Right) - 87 minutes   DICTATION: .Other Dictation: Dictation Number [id number]  PLAN OF CARE: Admit to inpatient   PATIENT DISPOSITION:  PACU - hemodynamically stable.   Delay start of Pharmacological VTE agent (>24hrs) due to surgical blood loss or risk of bleeding: not applicable

## 2015-07-11 NOTE — Anesthesia Procedure Notes (Signed)
Spinal Patient location during procedure: OR Start time: 07/11/2015 12:49 PM End time: 07/11/2015 12:51 PM Staffing Anesthesiologist: Franne Grip Resident/CRNA: Lupita Raider F Performed by: resident/CRNA  Preanesthetic Checklist Completed: patient identified, site marked, surgical consent, pre-op evaluation, timeout performed, IV checked, risks and benefits discussed and monitors and equipment checked Spinal Block Patient position: sitting Prep: Betadine and site prepped and draped Patient monitoring: heart rate, cardiac monitor, continuous pulse ox and blood pressure Approach: midline Location: L3-4 Injection technique: single-shot Needle Needle type: Spinocan  Needle gauge: 22 G Needle length: 5 cm Assessment Sensory level: T6 Additional Notes SAB placed without difficulty. Spinocan Spinal needle tray. Lot # [id number] Exp 2016/12/27

## 2015-07-11 NOTE — Transfer of Care (Signed)
Immediate Anesthesia Transfer of Care Note  Patient: Jonathan Grimes  Procedure(s) Performed: Procedure(s): REVISION RIGHT TOTAL KNEE ARTHROPLASTYone component only (Right)  Patient Location: PACU  Anesthesia Type:Spinal  Level of Consciousness: awake, alert  and oriented  Airway & Oxygen Therapy: Patient Spontanous Breathing and Patient connected to face mask oxygen  Post-op Assessment: Report given to RN and Post -op Vital signs reviewed and stable  Post vital signs: Reviewed and stable  Last Vitals:  Filed Vitals:   07/11/15 1000  BP: 145/87  Pulse: 69  Temp: 36.3 C  Resp: 18    Complications: No apparent anesthesia complications

## 2015-07-11 NOTE — Anesthesia Postprocedure Evaluation (Signed)
  Anesthesia Post-op Note  Patient: Jonathan Grimes  Procedure(s) Performed: Procedure(s) (LRB): REVISION RIGHT TOTAL KNEE ARTHROPLASTYone component only (Right)  Patient Location: PACU  Anesthesia Type: Spinal  Level of Consciousness: awake and alert   Airway and Oxygen Therapy: Patient Spontanous Breathing  Post-op Pain: mild  Post-op Assessment: Post-op Vital signs reviewed, Patient's Cardiovascular Status Stable, Respiratory Function Stable, Patent Airway and No signs of Nausea or vomiting. Purposeful movement both legs in PACU.  Last Vitals:  Filed Vitals:   07/11/15 1630  BP:   Pulse: 67  Temp: 36.7 C  Resp: 15    Post-op Vital Signs: stable   Complications: No apparent anesthesia complications

## 2015-07-11 NOTE — H&P (Signed)
TOTAL KNEE REVISION ADMISSION H&P  Patient is being admitted for right revision total knee arthroplasty.  Subjective:  Chief Complaint:right knee pain.  HPI: Jonathan Grimes, 59 y.o. male, has a history of pain and functional disability in the right knee(s) due to failed previous arthroplasty and patient has failed non-surgical conservative treatments for greater than 12 weeks to include NSAID's and/or analgesics, flexibility and strengthening excercises, weight reduction as appropriate and activity modification. The indications for the revision of the total knee arthroplasty are loosening of one or more components. Onset of symptoms was abrupt starting 1 years ago with gradually worsening course since that time.  Prior procedures on the right knee(s) include arthroplasty.  Patient currently rates pain in the right knee(s) at 8 out of 10 with activity. There is worsening of pain with activity and weight bearing, pain that interferes with activities of daily living, pain with passive range of motion and joint swelling.  Patient has evidence of prosthetic loosening by imaging studies. This condition presents safety issues increasing the risk of falls.  There is no current active infection.  Patient Active Problem List   Diagnosis Date Noted  . 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 Arthritis of right hip 02/22/2014  . Status post THR (total hip replacement) 02/22/2014  . Degenerative arthritis of hip 07/28/2012  . GERD 01/01/2010  . HEMATOCHEZIA 01/01/2010  . DYSPHAGIA UNSPECIFIED 01/01/2010   Past Medical History  Diagnosis Date  . Hypertension   . Elevated cholesterol   . GERD (gastroesophageal reflux disease)   . H/O hiatal hernia   . Arthritis     SEVERE OA LEFT HIP WITH PAIN  . Fatty liver   . Diabetes mellitus without complication ([place])     type 2  . Sleep apnea     pt not officially diagnosed, but pt and family state he has it    Past Surgical History   Procedure Laterality Date  . Joint replacement  2012    RIGHT TOTAL KNEE ARTHROPLASTY  . Total hip arthroplasty  07/28/2012    Procedure: TOTAL HIP ARTHROPLASTY ANTERIOR APPROACH;  Surgeon: Mcarthur Rossetti, MD;  Location: [facility] ORS;  Service: Orthopedics;  Laterality: Left;  . Total hip arthroplasty Right 02/22/2014    Procedure: RIGHT TOTAL HIP ARTHROPLASTY ANTERIOR APPROACH;  Surgeon: Mcarthur Rossetti, MD;  Location: [facility] ORS;  Service: Orthopedics;  Laterality: Right;    No prescriptions prior to admission   No Known Allergies  Social History  Substance Use Topics  . Smoking status: Former Smoker    Types: Cigarettes    Quit date: 01/06/1982  . Smokeless tobacco: Current User    Types: Chew     Comment: QUIT SMOKING 34 YRS AGO  QUIT CHEWING TOBACCO JAN 2012  . Alcohol Use: Yes     Comment: beer occasionally    No family history on file.    Review of Systems  Musculoskeletal: Positive for joint pain.  All other systems reviewed and are negative.    Objective:  Physical Exam  Constitutional: He is oriented to person, place, and time. He appears well-developed and well-nourished.  HENT:  Head: Normocephalic.  Eyes: EOM are normal. Pupils are equal, round, and reactive to light.  Neck: Normal range of motion. Neck supple.  Cardiovascular: Normal rate and regular rhythm.   Respiratory: Effort normal and breath sounds normal.  GI: Soft. Bowel sounds are normal.  Musculoskeletal:       Right knee: He exhibits effusion.  Tenderness found. Medial joint line tenderness noted.  Neurological: He is alert and oriented to person, place, and time.  Skin: Skin is warm and dry.  Psychiatric: He has a normal mood and affect.    Vital signs in last 24 hours:    Labs:  Estimated body mass index is 36.18 kg/(m^2) as calculated from the following:   Height as of 02/22/14: 6\' 1"  (1.854 m).   Weight as of 02/14/14: 124.376 kg (274 lb 3.2 oz).  Imaging Review Plain  radiographs demonstrate evidence of loosening of the tibial components. The bone quality appears to be good for age and reported activity level.   Assessment/Plan:  Failed previous right total knee arthroplasty.   The patient history, physical examination, clinical judgment of the provider and imaging studies are consistent with loosening of the   left knee(s), previous total knee arthroplasty. Revision total knee arthroplasty is deemed medically necessary. The treatment options including medical management, injection therapy, arthroscopy and revision arthroplasty were discussed at length. The risks and benefits of revision total knee arthroplasty were presented and reviewed. The risks due to aseptic loosening, infection, stiffness, patella tracking problems, thromboembolic complications and other imponderables were discussed. The patient acknowledged the explanation, agreed to proceed with the plan and consent was signed. Patient is being admitted for inpatient treatment for surgery, pain control, PT, OT, prophylactic antibiotics, VTE prophylaxis, progressive ambulation and ADL's and discharge planning.The patient is planning to be discharged home with home health services

## 2015-07-12 LAB — BASIC METABOLIC PANEL
Anion gap: 7 (ref 5–15)
BUN: 11 mg/dL (ref 6–20)
CALCIUM: 8.5 mg/dL — AB (ref 8.9–10.3)
CO2: 29 mmol/L (ref 22–32)
CREATININE: 0.66 mg/dL (ref 0.61–1.24)
Chloride: 96 mmol/L — ABNORMAL LOW (ref 101–111)
GFR calc Af Amer: >60 mL/min (ref >60)
Glucose, Bld: 153 mg/dL — ABNORMAL HIGH (ref 65–99)
POTASSIUM: 4.5 mmol/L (ref 3.5–5.1)
SODIUM: 132 mmol/L — AB (ref 135–145)

## 2015-07-12 LAB — GLUCOSE, CAPILLARY
GLUCOSE-CAPILLARY: 143 mg/dL — AB (ref 65–99)
GLUCOSE-CAPILLARY: 130 mg/dL — AB (ref 65–99)
GLUCOSE-CAPILLARY: 123 mg/dL — AB (ref 65–99)
Glucose-Capillary: 155 mg/dL — ABNORMAL HIGH (ref 65–99)

## 2015-07-12 LAB — CBC
HCT: 34.2 % — ABNORMAL LOW (ref 39.0–52.0)
Hemoglobin: 10.9 g/dL — ABNORMAL LOW (ref 13.0–17.0)
MCH: 28.9 pg (ref 26.0–34.0)
MCHC: 31.9 g/dL (ref 30.0–36.0)
MCV: 90.7 fL (ref 78.0–100.0)
PLATELETS: 147 10*3/uL — AB (ref 150–400)
RBC: 3.77 MIL/uL — AB (ref 4.22–5.81)
RDW: 14 % (ref 11.5–15.5)
WBC: 8.6 10*3/uL (ref 4.0–10.5)

## 2015-07-12 NOTE — Progress Notes (Signed)
Physical Therapy Treatment Patient Details Name: Jonathan Grimes MRN: [id number] DOB: 1956/06/30 Today's Date: 07/12/2015    History of Present Illness tibial revision of R tka    PT Comments    Patient is progressing.  Later noted patient up with family in hall. Safety encouraged as ice packs were still on the knee.  Follow Up Recommendations  Home health PT;Supervision/Assistance - 24 hour     Equipment Recommendations  None recommended by PT    Recommendations for Other Services       Precautions / Restrictions Precautions Precautions: Knee Required Braces or Orthoses: Knee Immobilizer - Right Knee Immobilizer - Right: Discontinue once straight leg raise with < 10 degree lag Restrictions Weight Bearing Restrictions: No RLE Weight Bearing: Weight bearing as tolerated    Mobility  Bed Mobility Overal bed mobility: Needs Assistance Bed Mobility: Sit to Supine     Supine to sit: Min assist Sit to supine: Supervision   General bed mobility comments: self assisted R leg onto bed  Transfers Overall transfer level: Needs assistance Equipment used: Rolling walker (2 wheeled) Transfers: Sit to/from Stand Sit to Stand: Min guard         General transfer comment: cues for safety  Ambulation/Gait Ambulation/Gait assistance: Min guard Ambulation Distance (Feet): 5 Feet Assistive device: Rolling walker (2 wheeled) Gait Pattern/deviations: Step-to pattern;Step-through pattern     General Gait Details: cueas for sequence and safety, a bit impulsive.   Stairs            Wheelchair Mobility    Modified Rankin (Stroke Patients Only)       Balance                                    Cognition Arousal/Alertness: Awake/alert Behavior During Therapy: WFL for tasks assessed/performed Overall Cognitive Status: Within Functional Limits for tasks assessed                      Exercises Total Joint Exercises Ankle Circles/Pumps:  AROM;Both;10 reps;Supine Quad Sets: AROM;Right;10 reps;Supine Heel Slides: AAROM;Right;10 reps;Supine Hip ABduction/ADduction: AROM;Right;10 reps;Supine Straight Leg Raises: AAROM;Right;15 reps;Supine    General Comments        Pertinent Vitals/Pain Pain Assessment: 0-10 Pain Score: 4  Pain Location: R knee Pain Descriptors / Indicators: Aching;Tightness Pain Intervention(s): Monitored during session;Premedicated before session;Ice applied    Home Living     Available Help at Discharge: Family       Home Layout: One level Home Equipment: Environmental consultant - 2 wheels;Cane - single point      Prior Function Level of Independence: Independent with assistive device(s)          PT Goals (current goals can now be found in the care plan section) Acute Rehab PT Goals Patient Stated Goal: to walk without pain PT Goal Formulation: With patient/family Time For Goal Achievement: 07/19/15 Potential to Achieve Goals: Good Progress towards PT goals: Progressing toward goals    Frequency  7X/week    PT Plan Current plan remains appropriate    Co-evaluation             End of Session Equipment Utilized During Treatment: Right knee immobilizer Activity Tolerance: Patient tolerated treatment well Patient left: in bed;with call bell/phone within reach;with bed alarm set;with family/visitor present     Time: [id number] PT Time Calculation (min) (ACUTE ONLY): 13 min  Charges:  $Gait Training: 8-22 mins  G Codes:      [name] 07/12/2015, 4:02 PM

## 2015-07-12 NOTE — Care Management Note (Signed)
Case Management Note  Patient Details  Name: Faruk Mcneil MRN: [id number] Date of Birth: 10-Mar-1956  Subjective/Objective:     REVISION RIGHT TOTAL KNEE ARTHROPLASTY    Action/Plan: NCM spoke to pt and offered choice for [facility]. Pt requested [facility] for HH. States he used [facility] in the past. Has RW and 3n1 at home. Wife at home to assist with his care.   Expected Discharge Date:                  Expected Discharge Plan:  [place]  In-House Referral:     Discharge planning Services  CM Consult  Post Acute Care Choice:  Home Health Choice offered to:  Patient   HH Arranged:  PT [place]:  [place]  Status of Service:  Completed, signed off  Medicare Important Message Given:    Date Medicare IM Given:    Medicare IM give by:    Date Additional Medicare IM Given:    Additional Medicare Important Message give by:     If discussed at [place] of Stay Meetings, dates discussed:    Additional Comments:  Erenest Rasher, RN 07/12/2015, 4:25 PM

## 2015-07-12 NOTE — Discharge Instructions (Addendum)
Information on my medicine - XARELTO (Rivaroxaban)  This medication education was reviewed with me or my healthcare representative as part of my discharge preparation.  The pharmacist that spoke with me during my hospital stay was:  Minda Ditto, [facility]  Why was Xarelto prescribed for you? Xarelto was prescribed for you to reduce the risk of blood clots forming after orthopedic surgery. The medical term for these abnormal blood clots is venous thromboembolism (VTE).  What do you need to know about xarelto ? Take your Xarelto ONCE DAILY at the same time every day. You may take it either with or without food.  If you have difficulty swallowing the tablet whole, you may crush it and mix in applesauce just prior to taking your dose.  Take Xarelto exactly as prescribed by your doctor and DO NOT stop taking Xarelto without talking to the doctor who prescribed the medication.  Stopping without other VTE prevention medication to take the place of Xarelto may increase your risk of developing a clot.  After discharge, you should have regular check-up appointments with your healthcare provider that is prescribing your Xarelto.    What do you do if you miss a dose? If you miss a dose, take it as soon as you remember on the same day then continue your regularly scheduled once daily regimen the next day. Do not take two doses of Xarelto on the same day.   Important Safety Information A possible side effect of Xarelto is bleeding. You should call your healthcare provider right away if you experience any of the following: ? Bleeding from an injury or your nose that does not stop. ? Unusual colored urine (red or dark brown) or unusual colored stools (red or black). ? Unusual bruising for unknown reasons. ? A serious fall or if you hit your head (even if there is no bleeding).  Some medicines may interact with Xarelto and might increase your risk of bleeding while on Xarelto. To help avoid this,  consult your healthcare provider or pharmacist prior to using any new prescription or non-prescription medications, including herbals, vitamins, non-steroidal anti-inflammatory drugs (NSAIDs) and supplements.  Aspirin 81mg  daily  This website has more information on Xarelto: [email or web address].[email or web address]   INSTRUCTIONS AFTER JOINT REPLACEMENT   o Remove items at home which could result in a fall. This includes throw rugs or furniture in walking pathways o ICE to the affected joint every three hours while awake for 30 minutes at a time, for at least the first 3-5 days, and then as needed for pain and swelling.  Continue to use ice for pain and swelling. You may notice swelling that will progress down to the foot and ankle.  This is normal after surgery.  Elevate your leg when you are not up walking on it.   o Continue to use the breathing machine you got in the hospital (incentive spirometer) which will help keep your temperature down.  It is common for your temperature to cycle up and down following surgery, especially at night when you are not up moving around and exerting yourself.  The breathing machine keeps your lungs expanded and your temperature down.   DIET:  As you were doing prior to hospitalization, we recommend a well-balanced diet.  DRESSING / WOUND CARE / SHOWERING  Keep the surgical dressing until follow up.  The dressing is water proof, so you can shower without any extra covering.  IF THE DRESSING FALLS OFF or the wound gets wet inside, change the  dressing with sterile gauze.  Please use good hand washing techniques before changing the dressing.  Do not use any lotions or creams on the incision until instructed by your surgeon.    ACTIVITY  o Increase activity slowly as tolerated, but follow the weight bearing instructions below.   o No driving for 6 weeks or until further direction given by your physician.  You cannot drive while taking narcotics.  o No lifting or carrying greater than  10 lbs. until further directed by your surgeon. o Avoid periods of inactivity such as sitting longer than an hour when not asleep. This helps prevent blood clots.  o You may return to work once you are authorized by your doctor.     WEIGHT BEARING   Weight bearing as tolerated with assist device (walker, cane, etc) as directed, use it as long as suggested by your surgeon or therapist, typically at least 4-6 weeks.   EXERCISES  Results after joint replacement surgery are often greatly improved when you follow the exercise, range of motion and muscle strengthening exercises prescribed by your doctor. Safety measures are also important to protect the joint from further injury. Any time any of these exercises cause you to have increased pain or swelling, decrease what you are doing until you are comfortable again and then slowly increase them. If you have problems or questions, call your caregiver or physical therapist for advice.   Rehabilitation is important following a joint replacement. After just a few days of immobilization, the muscles of the leg can become weakened and shrink (atrophy).  These exercises are designed to build up the tone and strength of the thigh and leg muscles and to improve motion. Often times heat used for twenty to thirty minutes before working out will loosen up your tissues and help with improving the range of motion but do not use heat for the first two weeks following surgery (sometimes heat can increase post-operative swelling).   These exercises can be done on a training (exercise) mat, on the floor, on a table or on a bed. Use whatever works the best and is most comfortable for you.    Use music or television while you are exercising so that the exercises are a pleasant break in your day. This will make your life better with the exercises acting as a break in your routine that you can look forward to.   Perform all exercises about fifteen times, three times per day or  as directed.  You should exercise both the operative leg and the other leg as well.  Exercises include:    Quad Sets - Tighten up the muscle on the front of the thigh (Quad) and hold for 5-10 seconds.    Straight Leg Raises - With your knee straight (if you were given a brace, keep it on), lift the leg to 60 degrees, hold for 3 seconds, and slowly lower the leg.  Perform this exercise against resistance later as your leg gets stronger.   Leg Slides: Lying on your back, slowly slide your foot toward your buttocks, bending your knee up off the floor (only go as far as is comfortable). Then slowly slide your foot back down until your leg is flat on the floor again.   Angel Wings: Lying on your back spread your legs to the side as far apart as you can without causing discomfort.   Hamstring Strength:  Lying on your back, push your heel against the floor with your leg  straight by tightening up the muscles of your buttocks.  Repeat, but this time bend your knee to a comfortable angle, and push your heel against the floor.  You may put a pillow under the heel to make it more comfortable if necessary.   A rehabilitation program following joint replacement surgery can speed recovery and prevent re-injury in the future due to weakened muscles. Contact your doctor or a physical therapist for more information on knee rehabilitation.    CONSTIPATION  Constipation is defined medically as fewer than three stools per week and severe constipation as less than one stool per week.  Even if you have a regular bowel pattern at home, your normal regimen is likely to be disrupted due to multiple reasons following surgery.  Combination of anesthesia, postoperative narcotics, change in appetite and fluid intake all can affect your bowels.   YOU MUST use at least one of the following options; they are listed in order of increasing strength to get the job done.  They are all available over the counter, and you may need  to use some, POSSIBLY even all of these options:    Drink plenty of fluids (prune juice may be helpful) and high fiber foods Colace 100 mg by mouth twice a day  Senokot for constipation as directed and as needed Dulcolax (bisacodyl), take with full glass of water  Miralax (polyethylene glycol) once or twice a day as needed.  If you have tried all these things and are unable to have a bowel movement in the first 3-4 days after surgery call either your surgeon or your primary doctor.    If you experience loose stools or diarrhea, hold the medications until you stool forms back up.  If your symptoms do not get better within 1 week or if they get worse, check with your doctor.  If you experience "the worst abdominal pain ever" or develop nausea or vomiting, please contact the office immediately for further recommendations for treatment.   ITCHING:  If you experience itching with your medications, try taking only a single pain pill, or even half a pain pill at a time.  You can also use Benadryl over the counter for itching or also to help with sleep.   TED HOSE STOCKINGS:  Use stockings on both legs until for at least 2 weeks or as directed by physician office. They may be removed at night for sleeping.  MEDICATIONS:  See your medication summary on the After Visit Summary that nursing will review with you.  You may have some home medications which will be placed on hold until you complete the course of blood thinner medication.  It is important for you to complete the blood thinner medication as prescribed.  PRECAUTIONS:  If you experience chest pain or shortness of breath - call 911 immediately for transfer to the hospital emergency department.   If you develop a fever greater that 101 F, purulent drainage from wound, increased redness or drainage from wound, foul odor from the wound/dressing, or calf pain - CONTACT YOUR SURGEON.                                                   FOLLOW-UP  APPOINTMENTS:  If you do not already have a post-op appointment, please call the office for an appointment to be seen by your  Psychologist, sport and exercise.  Guidelines for how soon to be seen are listed in your After Visit Summary, but are typically between 1-4 weeks after surgery.  OTHER INSTRUCTIONS:   Knee Replacement:  Do not place pillow under knee, focus on keeping the knee straight while resting. CPM instructions: 0-90 degrees, 2 hours in the morning, 2 hours in the afternoon, and 2 hours in the evening. Place foam block, curve side up under heel at all times except when in CPM or when walking.  DO NOT modify, tear, cut, or change the foam block in any way.  MAKE SURE YOU:   Understand these instructions.   Get help right away if you are not doing well or get worse.    Thank you for letting us be a part of your medical care team.  It is a privilege we respect greatly.  We hope these instructions will help you stay on track for a fast and full recovery!  tomorrow

## 2015-07-12 NOTE — Progress Notes (Signed)
OT Cancellation Note  Patient Details Name: Jonathan Grimes MRN: [id number] DOB: 10/27/1955   Cancelled Treatment:    Reason Eval/Treat Not Completed: OT screened, no needs identified, will sign off  Darlina Rumpf Hazel, OTR/L [id number]  07/12/2015, 2:59 PM

## 2015-07-12 NOTE — Op Note (Signed)
NAMEBORA, URSINI NO.:  [id number]  MEDICAL RECORD NO.:  [id number]  LOCATION:  [id number]                         FACILITY:  [facility]  PHYSICIAN:  Lind Guest. Ninfa Linden, M.D.DATE OF BIRTH:  June 20, 1956  DATE OF PROCEDURE:  07/11/2015 DATE OF DISCHARGE:                              OPERATIVE REPORT   PREOPERATIVE DIAGNOSIS:  Aseptic loosening, right total knee arthroplasty, tibial component.  POSTOPERATIVE DIAGNOSIS:  Aseptic loosening, right total knee arthroplasty, tibial component.  PROCEDURE:  Revision arthroplasty of right total knee with revision of tibial component only.  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  REVISION COMPONENTS:  [employer] size 6 right; tibial revision tray with a 12 x 160 mm stem and a 15 mm constrained polyethylene insert, 35 mm patellar button.  SURGEON:  Lind Guest. Ninfa Linden, M.D.  ASSISTANT:  Erskine Emery, PA-C.  ANESTHESIA:  Spinal.  TOURNIQUET TIME:  About 85 minutes.  ESTIMATED BLOOD LOSS:  Less than 500 mL.  ANTIBIOTICS:  2 g of IV Ancef.  COMPLICATIONS:  None.  INDICATIONS:  Mr. Holtzclaw is a 59 year old gentleman who I performed bilateral hip replacements in 2013 and 2015.  He had actually had a right total knee arthroplasty done at [facility] some time I believe before 2012.  He feels like it is about 4 or more years ago. This year, he started developing recurrent swelling in his right knee. I drained fluid from the knee and he had consistent findings of synovitis.  He did have plain film findings and a bone scan that showed loosening of the tibial component as well.  Given the recurrent effusions, the pain he is having in his knee, and obvious loosening of tibial component, we recommended a revision arthroplasty of the tibial component at least in other components pending our intraoperative findings.  At this point, he  understands the reasoning behind revision arthroplasty of his knee and he does wish to proceed.  He understands the risk of finding infection, need to do two-stage procedure with placing antibiotic spacer in the knee if we find infection, he also understands risk of acute blood loss anemia, nerve and vessel injury, fracture, and DVT.  He understands our goals are decreased swelling and improved function in his knee and decreased pain as well as improved mobility.  PROCEDURE DESCRIPTION:  After informed consent was obtained, appropriate right knee was marked.  He was brought to the operating room and spinal anesthesia was obtained.  He was then laid in a supine position and a nonsterile tourniquet was placed around his upper right leg and his right leg was prepped and draped from the thigh down the ankle with DuraPrep and sterile drapes.  Time-out was called and he was identified as correct patient, correct right knee.  He had obvious effusion of his knee.  We used an Esmarch to wrap out the leg and tourniquet was inflated to 300 mm of pressure.  We then performed a midline incision directly through his previous incision of his knee and isolated around the knee joint and performed a medial parapatellar arthrotomy finding a very large joint effusion.  We did send off  stacked Gram stain and cultures, which showed white blood cells, but no organisms.  We found significant metallosis and synovitis throughout the knee.  It was gray tissue all around both components.  The tibial component was obviously loose as well as the patella came right out, but the femoral component was well seated.  We did perform an extensive synovectomy of all 3 compartments of the knee and then we were able to remove the tibial component and all the cement from this easily.  I then removed the tibial component without difficulty and removed all the cement debris from around the tibial component.  We then made a fresh  up tibia cut and then drilled for a tibial tray and a tibial stem.  The size 6 tray still fit well so we were able to ream up for placing a 12 x 160 mm tibial component.  We then irrigated the knee with normal saline solution using at least 2 to 3 L of normal saline solution and again assessed the femoral component and found it to be intact.  We also made a freshening cut for the patella and drilled 3 holes for the new size 35 patella button.  We then mixed our cement.  We did place a cement restrictor plug and then we cemented the size 6 tibial revision tray, placed a [employer] with a 12 x 160 mm stem.  Once the cement had hardened, we also placed our real 15 mm constrained polyethylene insert and cemented our patellar button.  We then performed an additional synovectomy around the knee and irrigated the knee again with normal saline solution.  Once the cement had hardened, we let the tourniquet down.  Hemostasis was obtained as much as possible with electrocautery. We did place a medium Hemovac in the arthrotomy due to the synovectomy performed knowing that he is going to ooze and bleed quite a bit.  We closed the arthrotomy with interrupted #1 Vicryl suture followed by 0- Vicryl in the deep tissue, 2-0 Vicryl in the subcutaneous tissue, and interrupted staples on the skin.  Well-padded sterile dressing was applied.  He was taken to the recovery room in stable condition.  All final counts were correct.  There were no complications noted.  Of note, Erskine Emery, PA-C assisted during the entire case.  His assistance was crucial for facilitating all aspects of this case.     Lind Guest. Ninfa Linden, M.D.     CYB/MEDQ  D:  07/11/2015  T:  07/12/2015  Job:  [id number]

## 2015-07-12 NOTE — Evaluation (Signed)
Physical Therapy Evaluation Patient Details Name: Jonathan Grimes MRN: [id number] DOB: [birth year] Today's Date: 07/12/2015   History of Present Illness  tibial revision of R tka  Clinical Impression  Pt admitted with above diagnosis. Pt currently with functional limitations due to the deficits listed below (see PT Problem List).  Pt will benefit from skilled PT to increase their independence and safety with mobility to allow discharge to the venue listed below.  Patient tolerated ambulation well.     Follow Up Recommendations Home health PT;Supervision/Assistance - 24 hour    Equipment Recommendations  None recommended by PT    Recommendations for Other Services       Precautions / Restrictions Precautions Precautions: Knee Required Braces or Orthoses: Knee Immobilizer - Right Knee Immobilizer - Right: Discontinue once straight leg raise with < 10 degree lag Restrictions Weight Bearing Restrictions: No RLE Weight Bearing: Weight bearing as tolerated      Mobility  Bed Mobility Overal bed mobility: Needs Assistance Bed Mobility: Supine to Sit     Supine to sit: Min assist     General bed mobility comments: assist with R leg  Transfers Overall transfer level: Needs assistance Equipment used: Rolling walker (2 wheeled) Transfers: Sit to/from Stand Sit to Stand: Min guard         General transfer comment: cues for safety  Ambulation/Gait Ambulation/Gait assistance: Min assist Ambulation Distance (Feet): 250 Feet Assistive device: Rolling walker (2 wheeled) Gait Pattern/deviations: Step-to pattern;Step-through pattern     General Gait Details: cueas for sequence  Stairs            Wheelchair Mobility    Modified Rankin (Stroke Patients Only)       Balance                                             Pertinent Vitals/Pain Pain Assessment: 0-10 Pain Score: 4  Pain Location: R knee Pain Descriptors / Indicators:  Aching Pain Intervention(s): Monitored during session;Repositioned;Patient requesting pain meds-RN notified;Ice applied    Home Living Family/patient expects to be discharged to:: Private residence Living Arrangements: Spouse/significant other Available Help at Discharge: Family Type of Home: House Home Access: Stairs to enter Entrance Stairs-Rails: Psychiatric nurse of Steps: 3 Home Layout: One level Home Equipment: Environmental consultant - 2 wheels;Cane - single point      Prior Function Level of Independence: Independent with assistive device(s)               Hand Dominance        Extremity/Trunk Assessment   Upper Extremity Assessment: Overall WFL for tasks assessed           Lower Extremity Assessment: RLE deficits/detail RLE Deficits / Details: assist with SLr    Cervical / Trunk Assessment: Normal  Communication   Communication: No difficulties  Cognition Arousal/Alertness: Awake/alert Behavior During Therapy: WFL for tasks assessed/performed Overall Cognitive Status: Within Functional Limits for tasks assessed                      General Comments      Exercises        Assessment/Plan    PT Assessment Patient needs continued PT services  PT Diagnosis Difficulty walking;Acute pain   PT Problem List Decreased strength;Decreased range of motion;Decreased activity tolerance;Decreased mobility;Decreased knowledge of precautions;Decreased safety awareness;Decreased knowledge of use of DME;Pain  PT Treatment Interventions DME instruction;Gait training;Stair training;Functional mobility training;Therapeutic activities;Therapeutic exercise;Patient/family education   PT Goals (Current goals can be found in the Care Plan section) Acute Rehab PT Goals Patient Stated Goal: to walk without pain PT Goal Formulation: With patient/family Time For Goal Achievement: 07/19/15 Potential to Achieve Goals: Good    Frequency 7X/week   Barriers to  discharge        Co-evaluation               End of Session Equipment Utilized During Treatment: Gait belt;Right knee immobilizer Activity Tolerance: Patient tolerated treatment well Patient left: in chair;with call bell/phone within reach;with family/visitor present Nurse Communication: Mobility status         Time: [id number] PT Time Calculation (min) (ACUTE ONLY): 18 min   Charges:   PT Evaluation $Initial PT Evaluation Tier I: 1 Procedure     PT G CodesClaretha Cooper 07/12/2015, 1:53 PM

## 2015-07-12 NOTE — Progress Notes (Signed)
Subjective: 1 Day Post-Op Procedure(s) (LRB): REVISION RIGHT TOTAL KNEE ARTHROPLASTYone component only (Right) Patient reports pain as moderate.  Asymptomatic acute blood loss anemia.  Objective: Vital signs in last 24 hours: Temp:  [97.4 F (36.3 C)-98.3 F (36.8 C)] 97.5 F (36.4 C) (11/12 0634) Pulse Rate:  [58-90] 58 (11/12 0634) Resp:  [12-19] 14 (11/12 0634) BP: (100-128)/(58-72) 104/68 mmHg (11/12 0634) SpO2:  [89 %-100 %] 93 % (11/12 0634) Weight:  [126.735 kg (279 lb 6.4 oz)] 126.735 kg (279 lb 6.4 oz) (11/11 1719)  Intake/Output from previous day: 11/11 0701 - 11/12 0700 In: 3227.5 [P.O.:840; I.V.:2137.5; IV Piggyback:250] Out: 2135 [Urine:1500; Drains:535; Blood:100] Intake/Output this shift:     Recent Labs  07/12/15 0450  HGB 10.9*    Recent Labs  07/12/15 0450  WBC 8.6  RBC 3.77*  HCT 34.2*  PLT 147*    Recent Labs  07/12/15 0450  NA 132*  K 4.5  CL 96*  CO2 29  BUN 11  CREATININE 0.66  GLUCOSE 153*  CALCIUM 8.5*   No results for input(s): LABPT, INR in the last 72 hours.  Sensation intact distally Intact pulses distally Dorsiflexion/Plantar flexion intact Incision: dressing C/D/I Compartment soft  Assessment/Plan: 1 Day Post-Op Procedure(s) (LRB): REVISION RIGHT TOTAL KNEE ARTHROPLASTYone component only (Right) Up with therapy. D/C hemovac  Jesiah Yerby Y 07/12/2015, 11:01 AM

## 2015-07-13 LAB — GLUCOSE, CAPILLARY
GLUCOSE-CAPILLARY: 147 mg/dL — AB (ref 65–99)
GLUCOSE-CAPILLARY: 120 mg/dL — AB (ref 65–99)

## 2015-07-13 MED ORDER — OXYCODONE-ACETAMINOPHEN 5-325 MG PO TABS: 1.0000 | ORAL_TABLET | ORAL | Status: DC | PRN

## 2015-07-13 MED ORDER — METHOCARBAMOL 500 MG PO TABS: 500.0000 mg | ORAL_TABLET | Freq: Four times a day (QID) | ORAL | Status: DC | PRN

## 2015-07-13 MED ORDER — RIVAROXABAN 10 MG PO TABS: 10.0000 mg | ORAL_TABLET | Freq: Every day | ORAL | Status: DC

## 2015-07-13 NOTE — Progress Notes (Signed)
Physical Therapy Treatment Patient Details Name: Jonathan Grimes MRN: [id number] DOB: [birth year] Today's Date: 07/13/2015    History of Present Illness Pt is a 59 year old male s/p tibial revision of R TKA    PT Comments    Pt mobilizing well and practiced safe stair technique with spouse present.  Pt also performed LE exercises.  Pt feels ready for d/c home today.  Pt and spouse had no further questions.  Follow Up Recommendations  Home health PT;Supervision/Assistance - 24 hour     Equipment Recommendations  None recommended by PT    Recommendations for Other Services       Precautions / Restrictions Precautions Precautions: Knee Required Braces or Orthoses: Knee Immobilizer - Right Knee Immobilizer - Right: Discontinue once straight leg raise with < 10 degree lag Restrictions RLE Weight Bearing: Weight bearing as tolerated    Mobility  Bed Mobility               General bed mobility comments: Pt up in room on arrival (walking out of bathroom with spouse present)  Transfers Overall transfer level: Needs assistance Equipment used: Rolling walker (2 wheeled) Transfers: Sit to/from Stand Sit to Stand: Supervision         General transfer comment: verbal cues for R LE forward  Ambulation/Gait Ambulation/Gait assistance: Min guard Ambulation Distance (Feet): 400 Feet Assistive device: Rolling walker (2 wheeled) Gait Pattern/deviations: Step-through pattern;Decreased step length - left;Antalgic     General Gait Details: verbal cues for safety, using RW   Stairs Stairs: Yes Stairs assistance: Min guard Stair Management: Step to pattern;Backwards;With walker Number of Stairs: 3 General stair comments: verbal cues for safe sequence, RW positioning, spouse present and assisted with holding RW, performed twice  Wheelchair Mobility    Modified Rankin (Stroke Patients Only)       Balance                                    Cognition  Arousal/Alertness: Awake/alert Behavior During Therapy: WFL for tasks assessed/performed Overall Cognitive Status: Within Functional Limits for tasks assessed                      Exercises Total Joint Exercises Ankle Circles/Pumps: AROM;Both;10 reps;Supine Quad Sets: AROM;Right;10 reps;Supine Short Arc Quad: AAROM;Right;10 reps Heel Slides: AAROM;Right;10 reps;Supine Hip ABduction/ADduction: Right;10 reps;Supine;AAROM Straight Leg Raises: AAROM;Right;15 reps;Supine    General Comments        Pertinent Vitals/Pain Pain Assessment: 0-10 Pain Score: 3  Pain Location: R knee Pain Descriptors / Indicators: Aching;Tightness Pain Intervention(s): Limited activity within patient's tolerance;Premedicated before session;Repositioned;Ice applied;Monitored during session    Home Living                      Prior Function            PT Goals (current goals can now be found in the care plan section) Progress towards PT goals: Progressing toward goals    Frequency  7X/week    PT Plan Current plan remains appropriate    Co-evaluation             End of Session Equipment Utilized During Treatment: Gait belt Activity Tolerance: Patient tolerated treatment well Patient left: in chair;with call bell/phone within reach;with family/visitor present     Time: [id number] PT Time Calculation (min) (ACUTE ONLY): 24 min  Charges:  $Gait Training: 8-22  mins $Therapeutic Exercise: 8-22 mins                    G Codes:      [name],KATHrine E August 10, 2015, 12:20 PM Carmelia Bake, PT, DPT 08-10-15 Pager: [phone]

## 2015-07-13 NOTE — Progress Notes (Signed)
Pt provided with dc instructions and prescriptions; Escorted to lobby via wheelchair by tech; discharged home with spouse.

## 2015-07-13 NOTE — Discharge Summary (Signed)
Patient ID: Taylen Adcock MRN: [id number] DOB/AGE: 02-10-1956 59 y.o.  Admit date: 07/11/2015 Discharge date: 07/13/2015  Admission Diagnoses:  Principal Problem:   Failed total right knee replacement [facility]) Active Problems:   Status post revision of total replacement of right knee   Discharge Diagnoses:  Same  Past Medical History  Diagnosis Date  . Hypertension   . Elevated cholesterol   . GERD (gastroesophageal reflux disease)   . H/O hiatal hernia   . Arthritis     SEVERE OA LEFT HIP WITH PAIN  . Fatty liver   . Diabetes mellitus without complication ([place])     type 2  . Sleep apnea     pt not officially diagnosed, but pt and family state he has it    Surgeries: Procedure(s): [place] component only on 07/11/2015   Consultants:    Discharged Condition: Improved  Hospital Course: Daven Michalko is an 59 y.o. male who was admitted 07/11/2015 for operative treatment ofFailed total right knee replacement ([place]). Patient has severe unremitting pain that affects sleep, daily activities, and work/hobbies. After pre-op clearance the patient was taken to the operating room on 07/11/2015 and underwent  Procedure(s): REVISION RIGHT TOTAL KNEE ARTHROPLASTYone component only.    Patient was given perioperative antibiotics: Anti-infectives    Start     Dose/Rate Route Frequency Ordered Stop   07/11/15 1800  ceFAZolin (ANCEF) IVPB 2 g/50 mL premix     2 g 100 mL/hr over 30 Minutes Intravenous Every 6 hours 07/11/15 1709 07/12/15 0048   07/11/15 0600  ceFAZolin (ANCEF) 3 g in dextrose 5 % 50 mL IVPB     3 g 160 mL/hr over 30 Minutes Intravenous On call to O.R. 07/10/15 1355 07/11/15 1243       Patient was given sequential compression devices, early ambulation, and chemoprophylaxis to prevent DVT.  Patient benefited maximally from hospital stay and there were no complications.    Recent vital signs: Patient Vitals for the past 24 hrs:  BP  Temp Temp src Pulse Resp SpO2  07/13/15 0659 128/73 mmHg (!) 96.3 F (35.7 C) Oral 86 16 94 %  07/12/15 2119 (!) 121/48 mmHg 98.4 F (36.9 C) Oral 84 16 94 %  07/12/15 1503 104/62 mmHg 98.7 F (37.1 C) Oral 87 18 94 %     Recent laboratory studies:  Recent Labs  07/12/15 0450  WBC 8.6  HGB 10.9*  HCT 34.2*  PLT 147*  NA 132*  K 4.5  CL 96*  CO2 29  BUN 11  CREATININE 0.66  GLUCOSE 153*  CALCIUM 8.5*     Discharge Medications:     Medication List    STOP taking these medications        diclofenac 50 MG EC tablet  Commonly known as:  VOLTAREN     HYDROcodone-acetaminophen 5-325 MG tablet  Commonly known as:  NORCO/VICODIN      TAKE these medications        aspirin EC 81 MG tablet  Take 81 mg by mouth daily.     atorvastatin 10 MG tablet  Commonly known as:  LIPITOR  Take 10 mg by mouth every morning.     losartan 100 MG tablet  Commonly known as:  COZAAR  Take 100 mg by mouth daily.     MEGARED OMEGA-3 KRILL OIL PO  Take 1 capsule by mouth daily.     metFORMIN 500 MG tablet  Commonly known as:  GLUCOPHAGE  Take  500 mg by mouth 2 (two) times daily with a meal.     methocarbamol 500 MG tablet  Commonly known as:  ROBAXIN  Take 1 tablet (500 mg total) by mouth every 6 (six) hours as needed for muscle spasms.     omeprazole 40 MG capsule  Commonly known as:  PRILOSEC  Take 40 mg by mouth daily.     oxyCODONE-acetaminophen 5-325 MG tablet  Commonly known as:  ROXICET  Take 1-2 tablets by mouth every 4 (four) hours as needed for severe pain.     rivaroxaban 10 MG Tabs tablet  Commonly known as:  XARELTO  Take 1 tablet (10 mg total) by mouth daily with breakfast.        Diagnostic Studies: Dg Knee Right Port  07/11/2015  CLINICAL DATA:  Status post right knee replacement EXAM: PORTABLE RIGHT KNEE - 1-2 VIEW COMPARISON:  None. FINDINGS: Right knee prosthesis is now seen. Surgical drain is noted in place. Air is noted in the surgical bed. No  acute bony abnormality is noted. IMPRESSION: Status post right knee prosthesis Electronically Signed   By: Inez Catalina M.D.   On: 07/11/2015 16:18    Disposition: 06-Home-Health Care Svc      Discharge Instructions    Discharge patient    Complete by:  As directed            Follow-up Information    Follow up with [facility].   Why:  Home Health Physical Therapy   Contact information:   [address] [place] [address] [phone]       Follow up with Mcarthur Rossetti, MD. Schedule an appointment as soon as possible for a visit in 2 weeks.   Specialty:  Orthopedic Surgery   Contact information:   [place] [address] [phone]        Signed: Mcarthur Rossetti 07/13/2015, 1:16 PM

## 2015-07-13 NOTE — Progress Notes (Signed)
Patient ID: Jonathan Grimes, male   DOB: 1955/09/10, 59 y.o.   MRN: [id number] Looks good overall.  Mobilizing well.  Can be discharged to home today.

## 2015-07-14 ENCOUNTER — Encounter (HOSPITAL_COMMUNITY): Payer: Self-pay | Admitting: Orthopaedic Surgery

## 2015-07-14 LAB — BODY FLUID CULTURE: Culture: NO GROWTH

## 2015-07-16 LAB — ANAEROBIC CULTURE

## 2015-09-09 ENCOUNTER — Encounter: Payer: Self-pay | Admitting: Internal Medicine

## 2015-09-12 ENCOUNTER — Encounter: Payer: Self-pay | Admitting: Internal Medicine

## 2015-09-12 ENCOUNTER — Ambulatory Visit (INDEPENDENT_AMBULATORY_CARE_PROVIDER_SITE_OTHER): Payer: BLUE CROSS/BLUE SHIELD | Admitting: Internal Medicine

## 2015-09-12 ENCOUNTER — Other Ambulatory Visit: Payer: Self-pay

## 2015-09-12 VITALS — BP 130/80 | HR 80 | Temp 97.6°F | Ht 73.0 in | Wt 275.6 lb

## 2015-09-12 DIAGNOSIS — D509 Iron deficiency anemia, unspecified: Secondary | ICD-10-CM | POA: Diagnosis not present

## 2015-09-12 DIAGNOSIS — Z86010 Personal history of colonic polyps: Secondary | ICD-10-CM | POA: Diagnosis not present

## 2015-09-12 DIAGNOSIS — K219 Gastro-esophageal reflux disease without esophagitis: Secondary | ICD-10-CM | POA: Diagnosis not present

## 2015-09-12 MED ORDER — PEG 3350-KCL-NA BICARB-NACL 420 G PO SOLR: 4000.0000 mL | Freq: Once | ORAL | Status: DC

## 2015-09-12 NOTE — Progress Notes (Signed)
Primary Care Physician:  Purvis Kilts, MD Primary Gastroenterologist:  Dr. Gala Romney  Pre-Procedure History & Physical: HPI:  Jonathan Grimes is a 60 y.o. male here for for further evaluation of iron deficiency anemia. Jonathan Grimes reason underwent a right knee replacement. Transiently anticoagulated. We have seen him previously for colonic polyp; adenoma removed in 2011. History of GERD and dysphagia. He underwent dilation of Schatzki's ring back in 2011 as well.  GERD symptoms doing very well on omeprazole 40 mg daily. Takes diclofenac daily for osteoarthritis.  Denies any GI symptoms at this time including dysphagia, melena hematochezia or abdominal pain. Has gained weight over the past year related to his orthopedic procedures. Recent laboratory evaluation included H. and H. 11.6 and 35.2 with an MCV of 84.  Serum ferritin 40, TIBC 382 iron level 45;  saturation of 12%. Hemoccult negative x3 through Dr. Delanna Ahmadi office.  LFTs normal.  Labs from 08/26/15  Interestingly, vitamin B12 level lower limit of normal at 240  Past Medical History  Diagnosis Date  . Hypertension   . Elevated cholesterol   . GERD (gastroesophageal reflux disease)   . H/O hiatal hernia   . Arthritis     SEVERE OA LEFT HIP WITH PAIN  . Fatty liver   . Diabetes mellitus without complication ([name])     type 2  . Sleep apnea     pt not officially diagnosed, but pt and family state he has it  . Schatzki's ring   . Hiatal hernia   . Tubular adenoma     Past Surgical History  Procedure Laterality Date  . Joint replacement  2012    RIGHT TOTAL KNEE ARTHROPLASTY  . Total hip arthroplasty  07/28/2012    Procedure: TOTAL HIP ARTHROPLASTY ANTERIOR APPROACH;  Surgeon: Mcarthur Rossetti, MD;  Location: [facility];  Service: Orthopedics;  Laterality: Left;  . Total hip arthroplasty Right 02/22/2014    Procedure: RIGHT TOTAL HIP ARTHROPLASTY ANTERIOR APPROACH;  Surgeon: Mcarthur Rossetti, MD;  Location: [facility];   Service: Orthopedics;  Laterality: Right;  . Total knee revision Right 07/11/2015    Procedure: REVISION RIGHT TOTAL KNEE ARTHROPLASTYone component only;  Surgeon: Mcarthur Rossetti, MD;  Location: [facility];  Service: Orthopedics;  Laterality: Right;  . Colonoscopy  01/19/10    Dr.Sohail Capraro- friable anorectal. o/w normal rectum, L sided diverticula, sigmoid polyp bx= tubular adenoma,  . Esophagogastroduodenoscopy  01/19/10    Dr.Regina Coppolino- noncritical schatzki's ring, o/w normal esophagus, small hiatal hernia o/w normal stomach D1, D2    Prior to Admission medications   Medication Sig Start Date End Date Taking? Authorizing Provider  aspirin EC 81 MG tablet Take 81 mg by mouth daily.   Yes Historical Provider, MD  atorvastatin (LIPITOR) 10 MG tablet Take 10 mg by mouth every morning.    Yes Historical Provider, MD  losartan (COZAAR) 100 MG tablet Take 100 mg by mouth daily.   Yes Historical Provider, MD  MEGARED OMEGA-3 KRILL OIL PO Take 1 capsule by mouth daily.   Yes Historical Provider, MD  metFORMIN (GLUCOPHAGE) 500 MG tablet Take 500 mg by mouth 2 (two) times daily with a meal.   Yes Historical Provider, MD  omeprazole (PRILOSEC) 40 MG capsule Take 40 mg by mouth daily.   Yes Historical Provider, MD  methocarbamol (ROBAXIN) 500 MG tablet Take 1 tablet (500 mg total) by mouth every 6 (six) hours as needed for muscle spasms. Patient not taking: Reported on 09/12/2015 07/13/15  Pete Pelt, PA-C  oxyCODONE-acetaminophen (ROXICET) 5-325 MG tablet Take 1-2 tablets by mouth every 4 (four) hours as needed for severe pain. Patient not taking: Reported on 09/12/2015 07/13/15   Pete Pelt, PA-C  rivaroxaban (XARELTO) 10 MG TABS tablet Take 1 tablet (10 mg total) by mouth daily with breakfast. Patient not taking: Reported on 09/12/2015 07/13/15   Pete Pelt, PA-C    Allergies as of 09/12/2015  . (No Known Allergies)    No family history on file.  Social History   Social History  .  Marital Status: Married    Spouse Name: N/A  . Number of Children: N/A  . Years of Education: N/A   Occupational History  . Not on file.   Social History Main Topics  . Smoking status: Former Smoker    Types: Cigarettes    Quit date: 01/06/1982  . Smokeless tobacco: Current User    Types: Chew     Comment: QUIT SMOKING 34 YRS AGO  QUIT CHEWING TOBACCO JAN 2012  . Alcohol Use: Yes     Comment: beer occasionally  . Drug Use: No  . Sexual Activity: Not on file   Other Topics Concern  . Not on file   Social History Narrative    Review of Systems: See HPI, otherwise negative ROS  Physical Exam: BP 130/80 mmHg  Pulse 80  Temp(Src) 97.6 F (36.4 C) (Oral)  Ht 6\' 1"  (1.854 m)  Wt 275 lb 9.6 oz (125.011 kg)  BMI 36.37 kg/m2 General:   Alert,  Well-developed, well-nourished, pleasant and cooperative in NAD Skin:  Intact without significant lesions or rashes. He does have some malar telangiectasias Eyes:  Sclera clear, no icterus.   Conjunctiva pink. Ears:  Normal auditory acuity. Mouth:  No deformity or lesions. Neck:  Supple; no masses or thyromegaly. No significant cervical adenopathy. Lungs:  Clear throughout to auscultation.   No wheezes, crackles, or rhonchi. No acute distress. Heart:  Regular rate and rhythm; no murmurs, clicks, rubs,  or gallops. Abdomen: obese.  Non-distended, normal bowel sounds.  Soft and nontender without appreciable mass or hepatosplenomegaly.  Pulses:  Normal pulses noted. Extremities:  Without clubbing or edema.  Impression:  Pleasant 60 year old gentleman on diclofenac for osteoarthritis presents with a mild anemia wit iron studies suggestive of early iron deficiency.. Hemoccult negative x3. Clinically, no bleeding and no GI symptoms.  GERD symptoms well controlled on omeprazole. History of colonic adenoma; due for surveillance colonoscopy at this time. It is notable he continues on antiplatelet therapy with aspirin and was on anticoagulation  therapy related to his knee replacement about 2 weeks last month. Further GI evaluation warranted at this time.  Recommendations:   Diagnostic colonoscopy in the near future. If colonoscopy is unrevealing, we'll proceed with an EGD as discussed.  The risks, benefits, limitations, imponderables and alternatives regarding both EGD and colonoscopy have been reviewed with the patient. Questions have been answered. All parties agreeable.  Reflux symptoms well controlled on omeprazole; recommend continuing that regimen for now.  Further recommendations once endoscopic evaluation has taken place.      Notice: This dictation was prepared with Dragon dictation along with smaller phrase technology. Any transcriptional errors that result from this process are unintentional and may not be corrected upon review.

## 2015-09-12 NOTE — Patient Instructions (Signed)
Diagnostic colonoscopy and possible EGD for iron deficiency anemia (history of colon polyp)  Split prep  Continue omeprazole 40 mg daily  Further recommendations to follow

## 2015-09-23 ENCOUNTER — Ambulatory Visit: Payer: BLUE CROSS/BLUE SHIELD | Admitting: Gastroenterology

## 2015-10-02 ENCOUNTER — Ambulatory Visit (HOSPITAL_COMMUNITY)
Admission: RE | Admit: 2015-10-02 | Discharge: 2015-10-02 | Disposition: A | Discharge status: Home / Self Care | Payer: BLUE CROSS/BLUE SHIELD | Source: Ambulatory Visit | Attending: Internal Medicine | Admitting: Internal Medicine

## 2015-10-02 ENCOUNTER — Encounter (HOSPITAL_COMMUNITY): Payer: Self-pay | Admitting: *Deleted

## 2015-10-02 ENCOUNTER — Encounter (HOSPITAL_COMMUNITY)
Admission: RE | Disposition: A | Discharge status: Home / Self Care | Payer: Self-pay | Source: Ambulatory Visit | Attending: Internal Medicine

## 2015-10-02 DIAGNOSIS — Z7901 Long term (current) use of anticoagulants: Secondary | ICD-10-CM | POA: Insufficient documentation

## 2015-10-02 DIAGNOSIS — Z96642 Presence of left artificial hip joint: Secondary | ICD-10-CM | POA: Insufficient documentation

## 2015-10-02 DIAGNOSIS — Z7984 Long term (current) use of oral hypoglycemic drugs: Secondary | ICD-10-CM | POA: Insufficient documentation

## 2015-10-02 DIAGNOSIS — D509 Iron deficiency anemia, unspecified: Secondary | ICD-10-CM | POA: Diagnosis not present

## 2015-10-02 DIAGNOSIS — K5730 Diverticulosis of large intestine without perforation or abscess without bleeding: Secondary | ICD-10-CM | POA: Diagnosis not present

## 2015-10-02 DIAGNOSIS — Z96641 Presence of right artificial hip joint: Secondary | ICD-10-CM | POA: Insufficient documentation

## 2015-10-02 DIAGNOSIS — I10 Essential (primary) hypertension: Secondary | ICD-10-CM | POA: Insufficient documentation

## 2015-10-02 DIAGNOSIS — Z87891 Personal history of nicotine dependence: Secondary | ICD-10-CM | POA: Insufficient documentation

## 2015-10-02 DIAGNOSIS — E119 Type 2 diabetes mellitus without complications: Secondary | ICD-10-CM | POA: Insufficient documentation

## 2015-10-02 DIAGNOSIS — Z96651 Presence of right artificial knee joint: Secondary | ICD-10-CM | POA: Insufficient documentation

## 2015-10-02 DIAGNOSIS — Z7982 Long term (current) use of aspirin: Secondary | ICD-10-CM | POA: Diagnosis not present

## 2015-10-02 DIAGNOSIS — K3189 Other diseases of stomach and duodenum: Secondary | ICD-10-CM | POA: Diagnosis not present

## 2015-10-02 DIAGNOSIS — K219 Gastro-esophageal reflux disease without esophagitis: Secondary | ICD-10-CM | POA: Diagnosis not present

## 2015-10-02 DIAGNOSIS — Z86010 Personal history of colonic polyps: Secondary | ICD-10-CM | POA: Diagnosis not present

## 2015-10-02 DIAGNOSIS — M1612 Unilateral primary osteoarthritis, left hip: Secondary | ICD-10-CM | POA: Diagnosis not present

## 2015-10-02 DIAGNOSIS — E7800 Pure hypercholesterolemia, unspecified: Secondary | ICD-10-CM | POA: Insufficient documentation

## 2015-10-02 HISTORY — PX: COLONOSCOPY: SHX5424

## 2015-10-02 HISTORY — PX: BIOPSY: SHX5522

## 2015-10-02 HISTORY — PX: ESOPHAGOGASTRODUODENOSCOPY: SHX5428

## 2015-10-02 LAB — GLUCOSE, CAPILLARY: Glucose-Capillary: 110 mg/dL — ABNORMAL HIGH (ref 65–99)

## 2015-10-02 SURGERY — COLONOSCOPY
Anesthesia: Moderate Sedation

## 2015-10-02 MED ORDER — ONDANSETRON HCL 4 MG/2ML IJ SOLN
INTRAMUSCULAR | Status: AC
  Filled 2015-10-02: qty 2

## 2015-10-02 MED ORDER — LIDOCAINE VISCOUS 2 % MT SOLN
OROMUCOSAL | Status: DC | PRN
  Administered 2015-10-02: 1 via OROMUCOSAL

## 2015-10-02 MED ORDER — LIDOCAINE VISCOUS 2 % MT SOLN
OROMUCOSAL | Status: AC
  Filled 2015-10-02: qty 15

## 2015-10-02 MED ORDER — MIDAZOLAM HCL 5 MG/5ML IJ SOLN
INTRAMUSCULAR | Status: DC | PRN
  Administered 2015-10-02: 3 mg via INTRAVENOUS
  Administered 2015-10-02: 2 mg via INTRAVENOUS
  Administered 2015-10-02 (×2): 1 mg via INTRAVENOUS

## 2015-10-02 MED ORDER — MEPERIDINE HCL 100 MG/ML IJ SOLN
INTRAMUSCULAR | Status: DC | PRN
  Administered 2015-10-02: 50 mg via INTRAVENOUS
  Administered 2015-10-02 (×2): 25 mg via INTRAVENOUS

## 2015-10-02 MED ORDER — MEPERIDINE HCL 100 MG/ML IJ SOLN
INTRAMUSCULAR | Status: AC
  Filled 2015-10-02: qty 2

## 2015-10-02 MED ORDER — ONDANSETRON HCL 4 MG/2ML IJ SOLN
INTRAMUSCULAR | Status: DC | PRN
  Administered 2015-10-02: 4 mg via INTRAVENOUS

## 2015-10-02 MED ORDER — SODIUM CHLORIDE 0.9 % IV SOLN
INTRAVENOUS | Status: DC
  Administered 2015-10-02: 08:00:00 via INTRAVENOUS

## 2015-10-02 MED ORDER — MIDAZOLAM HCL 5 MG/5ML IJ SOLN
INTRAMUSCULAR | Status: AC
  Filled 2015-10-02: qty 10

## 2015-10-02 MED ORDER — STERILE WATER FOR IRRIGATION IR SOLN
Status: DC | PRN
  Administered 2015-10-02: 08:00:00

## 2015-10-02 NOTE — Op Note (Signed)
[facility] [address] [place], [address]   ENDOSCOPY PROCEDURE REPORT  PATIENT: [name], [name]  MR#: [id number] BIRTHDATE: [birth year] , 59  yrs. old GENDER: male ENDOSCOPIST: R.  Garfield Cornea, MD FACP FACG REFERRED BY:  Sharilyn Sites, M.D. PROCEDURE DATE:  10/16/15 PROCEDURE:  EGD w/ biopsy INDICATIONS:  Early iron deficiency anemia; negative colonoscopy; diclofenac use. MEDICATIONS: Versed 7 mg IV and Demerol 100 mg IV in divided doses. Zofran 4 mg IV. ASA CLASS:      Class II  CONSENT: The risks, benefits, limitations, alternatives and imponderables have been discussed.  The potential for biopsy, esophogeal dilation, etc. have also been reviewed.  Questions have been answered.  All parties agreeable.  Please see the history and physical in the medical record for more information.  DESCRIPTION OF PROCEDURE: After the risks benefits and alternatives of the procedure were thoroughly explained, informed consent was obtained.  The [id number]) endoscope was introduced through the mouth and advanced to the second portion of the duodenum , The instrument was slowly withdrawn as the mucosa was fully examined. Estimated blood loss is zero unless otherwise noted in this procedure report.    Normal-appearing tubular esophagus.  Stomach empty.  Scattered antral erosions.  No ulcer or infiltrating process.  Patent pylorus.  Normal-appearing first and second portion of the duodenum.  Retroflexed views revealed no abnormalities.    Biopsies of the abnormal antral mucosa taken for histologic study. The scope was then withdrawn from the patient and the procedure completed.  COMPLICATIONS: There were no immediate complications.  ENDOSCOPIC IMPRESSION: Antral erosions?"status post biopsy  RECOMMENDATIONS: Follow-up on pathology.  REPEAT EXAM:  eSigned:  R. Garfield Cornea, MD Rosalita [facility] 2015-10-16 9:08 AM    CC:  CPT CODES: ICD CODES:  The ICD  and CPT codes recommended by this software are interpretations from the data that the clinical staff has captured with the software.  The verification of the translation of this report to the ICD and CPT codes and modifiers is the sole responsibility of the health care institution and practicing physician where this report was generated.  [employer] will not be held responsible for the validity of the ICD and CPT codes included on this report.  AMA assumes no liability for data contained or not contained herein. CPT is a Designer, television/film set of the [employer].  PATIENT NAME:  Jonathan Grimes, Jonathan Grimes MR#: [id number]

## 2015-10-02 NOTE — Discharge Instructions (Signed)
Colonoscopy Discharge Instructions  Read the instructions outlined below and refer to this sheet in the next few weeks. These discharge instructions provide you with general information on caring for yourself after you leave the hospital. Your doctor may also give you specific instructions. While your treatment has been planned according to the most current medical practices available, unavoidable complications occasionally occur. If you have any problems or questions after discharge, call Dr. Gala Romney at [phone]. ACTIVITY  You may resume your regular activity, but move at a slower pace for the next 24 hours.   Take frequent rest periods for the next 24 hours.   Walking will help get rid of the air and reduce the bloated feeling in your belly (abdomen).   No driving for 24 hours (because of the medicine (anesthesia) used during the test).    Do not sign any important legal documents or operate any machinery for 24 hours (because of the anesthesia used during the test).  NUTRITION  Drink plenty of fluids.   You may resume your normal diet as instructed by your doctor.   Begin with a light meal and progress to your normal diet. Heavy or fried foods are harder to digest and may make you feel sick to your stomach (nauseated).   Avoid alcoholic beverages for 24 hours or as instructed.  MEDICATIONS  You may resume your normal medications unless your doctor tells you otherwise.  WHAT YOU CAN EXPECT TODAY  Some feelings of bloating in the abdomen.   Passage of more gas than usual.   Spotting of blood in your stool or on the toilet paper.  IF YOU HAD POLYPS REMOVED DURING THE COLONOSCOPY:  No aspirin products for 7 days or as instructed.   No alcohol for 7 days or as instructed.   Eat a soft diet for the next 24 hours.  FINDING OUT THE RESULTS OF YOUR TEST Not all test results are available during your visit. If your test results are not back during the visit, make an appointment  with your caregiver to find out the results. Do not assume everything is normal if you have not heard from your caregiver or the medical facility. It is important for you to follow up on all of your test results.  SEEK IMMEDIATE MEDICAL ATTENTION IF:  You have more than a spotting of blood in your stool.   Your belly is swollen (abdominal distention).   You are nauseated or vomiting.   You have a temperature over 101.  You have abdominal pain or discomfort that is severe or gets worse throughout the day. EGD Discharge instructions Please read the instructions outlined below and refer to this sheet in the next few weeks. These discharge instructions provide you with general information on caring for yourself after you leave the hospital. Your doctor may also give you specific instructions. While your treatment has been planned according to the most current medical practices available, unavoidable complications occasionally occur. If you have any problems or questions after discharge, please call your doctor. ACTIVITY You may resume your regular activity but move at a slower pace for the next 24 hours.  Take frequent rest periods for the next 24 hours.  Walking will help expel (get rid of) the air and reduce the bloated feeling in your abdomen.  No driving for 24 hours (because of the anesthesia (medicine) used during the test).  You may shower.  Do not sign any important legal documents or operate any machinery for 24  hours (because of the anesthesia used during the test).  NUTRITION Drink plenty of fluids.  You may resume your normal diet.  Begin with a light meal and progress to your normal diet.  Avoid alcoholic beverages for 24 hours or as instructed by your caregiver.  MEDICATIONS You may resume your normal medications unless your caregiver tells you otherwise.  WHAT YOU CAN EXPECT TODAY You may experience abdominal discomfort such as a feeling of fullness or gas pains.   FOLLOW-UP Your doctor will discuss the results of your test with you.  SEEK IMMEDIATE MEDICAL ATTENTION IF ANY OF THE FOLLOWING OCCUR: Excessive nausea (feeling sick to your stomach) and/or vomiting.  Severe abdominal pain and distention (swelling).  Trouble swallowing.  Temperature over 101 F (37.8 C).  Rectal bleeding or vomiting of blood.    Follow-up on pathology  Further recommendations to follow

## 2015-10-02 NOTE — Op Note (Signed)
[facility] [address] [place], [address]   COLONOSCOPY PROCEDURE REPORT  PATIENT: Jonathan Grimes, Jonathan Grimes  MR#: [id number] BIRTHDATE: 1956/08/17 , 59  yrs. old GENDER: male ENDOSCOPIST: R.  Garfield Cornea, MD FACP [facility] REFERRED [id number] Hilma Favors, M.D. PROCEDURE DATE:  10/06/2015 PROCEDURE:   Colonoscopy, diagnostic INDICATIONS:History of iron deficiency anemia; history of colonic adenoma. MEDICATIONS: Versed 6 mg IV and Demerol 75 mg IV in divided doses. Zofran 4 mg IV. ASA CLASS:       Class II  CONSENT: The risks, benefits, alternatives and imponderables including but not limited to bleeding, perforation as well as the possibility of a missed lesion have been reviewed.  The potential for biopsy, lesion removal, etc. have also been discussed. Questions have been answered.  All parties agreeable.  Please see the history and physical in the medical record for more information.  DESCRIPTION OF PROCEDURE:   After the risks benefits and alternatives of the procedure were thoroughly explained, informed consent was obtained.  The digital rectal exam revealed no abnormalities of the rectum.   The [id number]  endoscope was introduced through the anus and advanced to the cecum, which was identified by both the appendix and ileocecal valve. No adverse events experienced.   The quality of the prep was adequate  The instrument was then slowly withdrawn as the colon was fully examined. Estimated blood loss is zero unless otherwise noted in this procedure report.      COLON FINDINGS: Normal-appearing rectal mucosa.  Scattered sigmoid diverticula; the remainder of the colonic mucosa appeared normal. Retroflexion was performed. .  Withdrawal time=11 minutes 0 seconds.  The scope was withdrawn and the procedure completed. COMPLICATIONS: There were no immediate complications.  ENDOSCOPIC IMPRESSION: Colonic diverticulosis  RECOMMENDATIONS: See EGD  report.  eSigned:  R. Garfield Cornea, MD Rosalita Chessman [facility] 06-Oct-2015 8:56 AM   cc:  CPT CODES: ICD CODES:  The ICD and CPT codes recommended by this software are interpretations from the data that the clinical staff has captured with the software.  The verification of the translation of this report to the ICD and CPT codes and modifiers is the sole responsibility of the health care institution and practicing physician where this report was generated.  [employer] will not be held responsible for the validity of the ICD and CPT codes included on this report.  AMA assumes no liability for data contained or not contained herein. CPT is a Designer, television/film set of the [employer].  PATIENT NAME:  Jonathan Grimes, Jonathan Grimes MR#: [id number]

## 2015-10-02 NOTE — H&P (View-Only) (Signed)
Primary Care Physician:  Purvis Kilts, MD Primary Gastroenterologist:  Dr. Gala Romney  Pre-Procedure History & Physical: HPI:  Jonathan Grimes is a 60 y.o. male here for for further evaluation of iron deficiency anemia. Jonathan Grimes reason underwent a right knee replacement. Transiently anticoagulated. We have seen him previously for colonic polyp; adenoma removed in 2011. History of GERD and dysphagia. He underwent dilation of Schatzki's ring back in 2011 as well.  GERD symptoms doing very well on omeprazole 40 mg daily. Takes diclofenac daily for osteoarthritis.  Denies any GI symptoms at this time including dysphagia, melena hematochezia or abdominal pain. Has gained weight over the past year related to his orthopedic procedures. Recent laboratory evaluation included H. and H. 11.6 and 35.2 with an MCV of 84.  Serum ferritin 40, TIBC 382 iron level 45;  saturation of 12%. Hemoccult negative x3 through Dr. Delanna Ahmadi office.  LFTs normal.  Labs from 08/26/15  Interestingly, vitamin B12 level lower limit of normal at 240  Past Medical History  Diagnosis Date  . Hypertension   . Elevated cholesterol   . GERD (gastroesophageal reflux disease)   . H/O hiatal hernia   . Arthritis     SEVERE OA LEFT HIP WITH PAIN  . Fatty liver   . Diabetes mellitus without complication ([place])     type 2  . Sleep apnea     pt not officially diagnosed, but pt and family state he has it  . Schatzki's ring   . Hiatal hernia   . Tubular adenoma     Past Surgical History  Procedure Laterality Date  . Joint replacement  2012    RIGHT TOTAL KNEE ARTHROPLASTY  . Total hip arthroplasty  07/28/2012    Procedure: TOTAL HIP ARTHROPLASTY ANTERIOR APPROACH;  Surgeon: Mcarthur Rossetti, MD;  Location: [facility];  Service: Orthopedics;  Laterality: Left;  . Total hip arthroplasty Right 02/22/2014    Procedure: RIGHT TOTAL HIP ARTHROPLASTY ANTERIOR APPROACH;  Surgeon: Mcarthur Rossetti, MD;  Location: [facility];   Service: Orthopedics;  Laterality: Right;  . Total knee revision Right 07/11/2015    Procedure: REVISION RIGHT TOTAL KNEE ARTHROPLASTYone component only;  Surgeon: Mcarthur Rossetti, MD;  Location: [facility];  Service: Orthopedics;  Laterality: Right;  . Colonoscopy  01/19/10    Dr.Rourk- friable anorectal. o/w normal rectum, L sided diverticula, sigmoid polyp bx= tubular adenoma,  . Esophagogastroduodenoscopy  01/19/10    Dr.Rourk- noncritical schatzki's ring, o/w normal esophagus, small hiatal hernia o/w normal stomach D1, D2    Prior to Admission medications   Medication Sig Start Date End Date Taking? Authorizing Provider  aspirin EC 81 MG tablet Take 81 mg by mouth daily.   Yes Historical Provider, MD  atorvastatin (LIPITOR) 10 MG tablet Take 10 mg by mouth every morning.    Yes Historical Provider, MD  losartan (COZAAR) 100 MG tablet Take 100 mg by mouth daily.   Yes Historical Provider, MD  MEGARED OMEGA-3 KRILL OIL PO Take 1 capsule by mouth daily.   Yes Historical Provider, MD  metFORMIN (GLUCOPHAGE) 500 MG tablet Take 500 mg by mouth 2 (two) times daily with a meal.   Yes Historical Provider, MD  omeprazole (PRILOSEC) 40 MG capsule Take 40 mg by mouth daily.   Yes Historical Provider, MD  methocarbamol (ROBAXIN) 500 MG tablet Take 1 tablet (500 mg total) by mouth every 6 (six) hours as needed for muscle spasms. Patient not taking: Reported on 09/12/2015 07/13/15  Pete Pelt, PA-C  oxyCODONE-acetaminophen (ROXICET) 5-325 MG tablet Take 1-2 tablets by mouth every 4 (four) hours as needed for severe pain. Patient not taking: Reported on 09/12/2015 07/13/15   Pete Pelt, PA-C  rivaroxaban (XARELTO) 10 MG TABS tablet Take 1 tablet (10 mg total) by mouth daily with breakfast. Patient not taking: Reported on 09/12/2015 07/13/15   Pete Pelt, PA-C    Allergies as of 09/12/2015  . (No Known Allergies)    No family history on file.  Social History   Social History  .  Marital Status: Married    Spouse Name: N/A  . Number of Children: N/A  . Years of Education: N/A   Occupational History  . Not on file.   Social History Main Topics  . Smoking status: Former Smoker    Types: Cigarettes    Quit date: 01/06/1982  . Smokeless tobacco: Current User    Types: Chew     Comment: QUIT SMOKING 34 YRS AGO  QUIT CHEWING TOBACCO JAN 2012  . Alcohol Use: Yes     Comment: beer occasionally  . Drug Use: No  . Sexual Activity: Not on file   Other Topics Concern  . Not on file   Social History Narrative    Review of Systems: See HPI, otherwise negative ROS  Physical Exam: BP 130/80 mmHg  Pulse 80  Temp(Src) 97.6 F (36.4 C) (Oral)  Ht 6\' 1"  (1.854 m)  Wt 275 lb 9.6 oz (125.011 kg)  BMI 36.37 kg/m2 General:   Alert,  Well-developed, well-nourished, pleasant and cooperative in NAD Skin:  Intact without significant lesions or rashes. He does have some malar telangiectasias Eyes:  Sclera clear, no icterus.   Conjunctiva pink. Ears:  Normal auditory acuity. Mouth:  No deformity or lesions. Neck:  Supple; no masses or thyromegaly. No significant cervical adenopathy. Lungs:  Clear throughout to auscultation.   No wheezes, crackles, or rhonchi. No acute distress. Heart:  Regular rate and rhythm; no murmurs, clicks, rubs,  or gallops. Abdomen: obese.  Non-distended, normal bowel sounds.  Soft and nontender without appreciable mass or hepatosplenomegaly.  Pulses:  Normal pulses noted. Extremities:  Without clubbing or edema.  Impression:  Pleasant 60 year old gentleman on diclofenac for osteoarthritis presents with a mild anemia wit iron studies suggestive of early iron deficiency.. Hemoccult negative x3. Clinically, no bleeding and no GI symptoms.  GERD symptoms well controlled on omeprazole. History of colonic adenoma; due for surveillance colonoscopy at this time. It is notable he continues on antiplatelet therapy with aspirin and was on anticoagulation  therapy related to his knee replacement about 2 weeks last month. Further GI evaluation warranted at this time.  Recommendations:   Diagnostic colonoscopy in the near future. If colonoscopy is unrevealing, we'll proceed with an EGD as discussed.  The risks, benefits, limitations, imponderables and alternatives regarding both EGD and colonoscopy have been reviewed with the patient. Questions have been answered. All parties agreeable.  Reflux symptoms well controlled on omeprazole; recommend continuing that regimen for now.  Further recommendations once endoscopic evaluation has taken place.      Notice: This dictation was prepared with Dragon dictation along with smaller phrase technology. Any transcriptional errors that result from this process are unintentional and may not be corrected upon review.

## 2015-10-02 NOTE — Interval H&P Note (Signed)
History and Physical Interval Note:  10/02/2015 8:21 AM  Jonathan Grimes  has presented today for surgery, with the diagnosis of history of colon polyp, iron deficiency anemia  The various methods of treatment have been discussed with the patient and family. After consideration of risks, benefits and other options for treatment, the patient has consented to  Procedure(s) with comments: COLONOSCOPY (N/A) - 0815 ESOPHAGOGASTRODUODENOSCOPY (EGD) (N/A) as a surgical intervention .  The patient's history has been reviewed, patient examined, no change in status, stable for surgery.  I have reviewed the patient's chart and labs.  Questions were answered to the patient's satisfaction.     Jonathan Grimes  No change. Colonoscopy and possible  EGD to follow her plan.The risks, benefits, limitations, imponderables and alternatives regarding both EGD and colonoscopy have been reviewed with the patient. Questions have been answered. All parties agreeable.

## 2015-10-05 ENCOUNTER — Encounter: Payer: Self-pay | Admitting: Internal Medicine

## 2015-10-06 ENCOUNTER — Encounter (HOSPITAL_COMMUNITY): Payer: Self-pay | Admitting: Internal Medicine

## 2015-10-07 ENCOUNTER — Telehealth: Payer: Self-pay

## 2015-10-07 ENCOUNTER — Encounter: Payer: Self-pay | Admitting: Internal Medicine

## 2015-10-07 NOTE — Telephone Encounter (Signed)
Per RMR-  Send letter to patient.  Send copy of letter with path to referring provider and PCP.   Lets offer a f/u appt in the coming weeks in reference to IDA w extender

## 2015-10-07 NOTE — Telephone Encounter (Signed)
Letter mailed to the pt. 

## 2015-10-07 NOTE — Telephone Encounter (Signed)
APPT MADE AND LETTER SENT  °

## 2015-10-21 ENCOUNTER — Ambulatory Visit: Payer: BLUE CROSS/BLUE SHIELD | Admitting: Gastroenterology

## 2015-11-04 ENCOUNTER — Ambulatory Visit (INDEPENDENT_AMBULATORY_CARE_PROVIDER_SITE_OTHER): Payer: BLUE CROSS/BLUE SHIELD | Admitting: Gastroenterology

## 2015-11-04 ENCOUNTER — Encounter: Payer: Self-pay | Admitting: Gastroenterology

## 2015-11-04 VITALS — BP 143/87 | HR 90 | Temp 97.6°F | Ht 73.0 in | Wt 268.0 lb

## 2015-11-04 DIAGNOSIS — D509 Iron deficiency anemia, unspecified: Secondary | ICD-10-CM

## 2015-11-04 DIAGNOSIS — K219 Gastro-esophageal reflux disease without esophagitis: Secondary | ICD-10-CM | POA: Diagnosis not present

## 2015-11-04 DIAGNOSIS — E538 Deficiency of other specified B group vitamins: Secondary | ICD-10-CM | POA: Diagnosis not present

## 2015-11-04 NOTE — Assessment & Plan Note (Addendum)
Well controlled on omeprazole once daily. Continue antireflux measures.

## 2015-11-04 NOTE — Patient Instructions (Signed)
1. Please have your labs done. We will contact you within 5-7 business days with results.

## 2015-11-04 NOTE — Progress Notes (Signed)
      Primary Care Physician: Purvis Kilts, MD  Primary Gastroenterologist:  Garfield Cornea, MD   Chief Complaint  Patient presents with  . Follow-up    HPI: Jonathan Grimes is a 60 y.o. male here for follow-up of IDA. Colonoscopy and EGD done last month with scattered sigmoid colon diverticula, scattered antral erosions (biopsy with gastric mucosa with intestinal metaplasia, no H pylori). Patient underwent right knee replacement back in 07/2015. 500cc blood loss during surgery. Takes diclofenac daily for osteoarthritis. Omeprazole chronically for GERD with good control. Recent laboratory evaluation included H. and H. 11.6 and 35.2 with an MCV of 84. Serum ferritin 40, TIBC 382 iron level 45; saturation of 12%. Hemoccult negative x3 through Dr. Delanna Ahmadi office. LFTs normal. Labs from 08/26/15  Since diagnosed with DM, has cut back on fast food. Intentional weight loss of 7 pounds in past two months.   No abdominal pain, constipation, diarrhea, melena, brbpr. Heartburn well-controlled. No dysphagai.   Current Outpatient Prescriptions  Medication Sig Dispense Refill  . aspirin EC 81 MG tablet Take 81 mg by mouth daily.    Marland Kitchen atorvastatin (LIPITOR) 10 MG tablet Take 10 mg by mouth every morning.     . diclofenac (VOLTAREN) 75 MG EC tablet Take 75 mg by mouth 2 (two) times daily.    Marland Kitchen losartan (COZAAR) 100 MG tablet Take 100 mg by mouth daily.    Marland Kitchen MEGARED OMEGA-3 KRILL OIL PO Take 1 capsule by mouth daily.    . metFORMIN (GLUCOPHAGE) 500 MG tablet Take 500 mg by mouth 2 (two) times daily with a meal.    . omeprazole (PRILOSEC) 40 MG capsule Take 40 mg by mouth daily.     No current facility-administered medications for this visit.    Allergies as of 11/04/2015  . (No Known Allergies)    ROS:  General: Negative for anorexia, weight loss, fever, chills, fatigue, weakness. ENT: Negative for hoarseness, difficulty swallowing , nasal congestion. CV: Negative for chest pain,  angina, palpitations, dyspnea on exertion, peripheral edema.  Respiratory: Negative for dyspnea at rest, dyspnea on exertion, cough, sputum, wheezing.  GI: See history of present illness. GU:  Negative for dysuria, hematuria, urinary incontinence, urinary frequency, nocturnal urination.  Endo: Negative for unusual weight change.    Physical Examination:   BP 143/87 mmHg  Pulse 90  Temp(Src) 97.6 F (36.4 C) (Oral)  Ht 6\' 1"  (1.854 m)  Wt 268 lb (121.564 kg)  BMI 35.37 kg/m2  General: Well-nourished, well-developed in no acute distress.  Eyes: No icterus. Mouth: Oropharyngeal mucosa moist and pink , no lesions erythema or exudate. Lungs: Clear to auscultation bilaterally.  Heart: Regular rate and rhythm, no murmurs rubs or gallops.  Abdomen: Bowel sounds are normal, nontender, nondistended, no hepatosplenomegaly or masses, no abdominal bruits or hernia , no rebound or guarding.   Extremities: No lower extremity edema. No clubbing or deformities. Neuro: Alert and oriented x 4   Skin: Warm and dry, no jaundice.   Psych: Alert and cooperative, normal mood and affect.

## 2015-11-04 NOTE — Assessment & Plan Note (Signed)
Mild anemia with trend toward IDA/low B12. Knee replacement 07/2015. At this time will plan on updating labs. If persistent IDA, consider small bowel capsule endoscopy.

## 2015-11-04 NOTE — Progress Notes (Signed)
CC'ED TO PCP 

## 2015-11-05 ENCOUNTER — Telehealth: Payer: Self-pay

## 2015-11-05 LAB — IRON AND TIBC
IRON SATURATION: 10 % — AB (ref 15–55)
Iron: 42 ug/dL (ref 38–169)
Total Iron Binding Capacity: 438 ug/dL (ref 250–450)
UIBC: 396 ug/dL — ABNORMAL HIGH (ref 111–343)

## 2015-11-05 LAB — CBC WITH DIFFERENTIAL/PLATELET
BASOS: 1 %
Basophils Absolute: 0.1 10*3/uL (ref 0.0–0.2)
EOS (ABSOLUTE): 0.5 10*3/uL — AB (ref 0.0–0.4)
Eos: 7 %
Hematocrit: 34.6 % — ABNORMAL LOW (ref 37.5–51.0)
Hemoglobin: 11.3 g/dL — ABNORMAL LOW (ref 12.6–17.7)
IMMATURE GRANS (ABS): 0 10*3/uL (ref 0.0–0.1)
IMMATURE GRANULOCYTES: 0 %
LYMPHS: 24 %
Lymphocytes Absolute: 1.8 10*3/uL (ref 0.7–3.1)
MCH: 26.2 pg — ABNORMAL LOW (ref 26.6–33.0)
MCHC: 32.7 g/dL (ref 31.5–35.7)
MCV: 80 fL (ref 79–97)
MONOCYTES: 8 %
Monocytes Absolute: 0.6 10*3/uL (ref 0.1–0.9)
NEUTROS PCT: 60 %
Neutrophils Absolute: 4.5 10*3/uL (ref 1.4–7.0)
PLATELETS: 229 10*3/uL (ref 150–379)
RBC: 4.32 x10E6/uL (ref 4.14–5.80)
RDW: 16.3 % — ABNORMAL HIGH (ref 12.3–15.4)
WBC: 7.4 10*3/uL (ref 3.4–10.8)

## 2015-11-05 LAB — FERRITIN: Ferritin: 31 ng/mL (ref 30–400)

## 2015-11-05 LAB — VITAMIN B12: VITAMIN B 12: 216 pg/mL (ref 211–946)

## 2015-11-05 NOTE — Telephone Encounter (Signed)
[employer] labs are back and are in  [employer]

## 2015-11-05 NOTE — Telephone Encounter (Signed)
Noted  

## 2015-11-10 NOTE — Progress Notes (Signed)
Quick Note:  Please let patient know that he has persistent IDA. No improvement in the past 3 months.  His B12 if borderline low.   Please let patient know he needs to follow up with PCP regarding low normal B12 for appropriate work up. Send last two B12 levels to PCP.  He should consider givens capsule study to complete GI work up for [place]. Once capsule study done, start ferrous sulfate 325mg  BID.   Repeat CBC, iron/tibc, ferritin in 3 months. ______

## 2015-11-11 ENCOUNTER — Other Ambulatory Visit: Payer: Self-pay

## 2015-11-11 ENCOUNTER — Other Ambulatory Visit: Payer: Self-pay | Admitting: Gastroenterology

## 2015-11-11 DIAGNOSIS — D509 Iron deficiency anemia, unspecified: Secondary | ICD-10-CM

## 2015-11-21 ENCOUNTER — Ambulatory Visit (HOSPITAL_COMMUNITY)
Admission: RE | Admit: 2015-11-21 | Discharge: 2015-11-21 | Disposition: A | Discharge status: Home / Self Care | Payer: BLUE CROSS/BLUE SHIELD | Source: Ambulatory Visit | Attending: Internal Medicine | Admitting: Internal Medicine

## 2015-11-21 ENCOUNTER — Encounter (HOSPITAL_COMMUNITY): Payer: Self-pay | Admitting: *Deleted

## 2015-11-21 ENCOUNTER — Encounter (HOSPITAL_COMMUNITY)
Admission: RE | Disposition: A | Discharge status: Home / Self Care | Payer: Self-pay | Source: Ambulatory Visit | Attending: Internal Medicine

## 2015-11-21 DIAGNOSIS — K259 Gastric ulcer, unspecified as acute or chronic, without hemorrhage or perforation: Secondary | ICD-10-CM | POA: Insufficient documentation

## 2015-11-21 DIAGNOSIS — D509 Iron deficiency anemia, unspecified: Secondary | ICD-10-CM | POA: Insufficient documentation

## 2015-11-21 DIAGNOSIS — R195 Other fecal abnormalities: Secondary | ICD-10-CM | POA: Diagnosis not present

## 2015-11-21 DIAGNOSIS — D649 Anemia, unspecified: Secondary | ICD-10-CM | POA: Insufficient documentation

## 2015-11-21 HISTORY — PX: GIVENS CAPSULE STUDY: SHX5432

## 2015-11-21 SURGERY — IMAGING PROCEDURE, GI TRACT, INTRALUMINAL, VIA CAPSULE

## 2015-11-24 ENCOUNTER — Encounter (HOSPITAL_COMMUNITY): Payer: Self-pay | Admitting: Internal Medicine

## 2015-11-25 ENCOUNTER — Telehealth: Payer: Self-pay | Admitting: Gastroenterology

## 2015-11-25 DIAGNOSIS — D509 Iron deficiency anemia, unspecified: Secondary | ICD-10-CM | POA: Insufficient documentation

## 2015-11-25 DIAGNOSIS — R195 Other fecal abnormalities: Secondary | ICD-10-CM | POA: Insufficient documentation

## 2015-11-25 DIAGNOSIS — K259 Gastric ulcer, unspecified as acute or chronic, without hemorrhage or perforation: Secondary | ICD-10-CM | POA: Insufficient documentation

## 2015-11-25 NOTE — Telephone Encounter (Signed)
Please let patient know his small bowel capsule endoscopy was unremarkable. See below for recommendations.   Summary & Recommendations: 1. Mild anemia, early IDA postoperatively. Right knee replacement 07/2015 with 500 mL of blood loss. Also on daily diclofenac for osteoarthritis. Nothing seen on small bowel capsule study to explain his anemia. He had scattered erosions as previously noted in the stomach on EGD. Patient is also known to have borderline B12 level. 2. Requested patient to see his PCP regarding borderline low B12 previously. 3. Patient was started on ferrous sulfate 325 mg twice a day after capsule endoscopy. 4. We will recheck a CBC, iron/TIBC, ferritin in June 2017. Patient call sooner if needed.  Laureen Ochs. Bobby Rumpf, PA-C

## 2015-11-25 NOTE — Op Note (Signed)
  Small Bowel Givens Capsule Study Procedure date:  11/21/2015  Referring Provider:  Mahala Menghini, PA-C/Michael Rourk, MD  PCP:  Dr. Hilma Favors, Betsy Coder, MD  Indication for procedure:  60 y/o male with history of mild anemia/early IDA. Colonoscopy and EGD done last month with scattered sigmoid colon diverticula, scattered antral erosions (biopsy with gastric mucosa with intestinal metaplasia, no H pylori). Patient underwent right knee replacement back in 07/2015. 500cc blood loss during surgery. Takes diclofenac daily for osteoarthritis. Omeprazole chronically for GERD with good control.  Patient data:  Wt: 268 lb Ht: 6'1"  Findings:  Scattered erosions noted in the stomach as previously seen at time of EGD. Small bowel appeared unremarkable. Complete small bowel capsule endoscopy.  First Gastric image:  47 seconds First Duodenal image: 22 minutes 2 seconds First Ileo-Cecal Valve image: 2 hours 49 minutes 17 seconds First Cecal image: 2 hours 49 minutes 46 seconds Gastric Passage time: 21 minutes Small Bowel Passage time:  2 hours 27 minutes  Summary & Recommendations: 1. Mild anemia, early IDA postoperatively. Right knee replacement 07/2015 with 500 mL of blood loss. Also on daily diclofenac for osteoarthritis. Nothing seen on small bowel capsule study to explain his anemia. He had scattered erosions as previously noted in the stomach on EGD. Patient is also known to have borderline B12 level. 2. Requested patient to see his PCP regarding borderline low B12 previously. 3. Patient was started on ferrous sulfate 325 mg twice a day after capsule endoscopy. 4. We will recheck a CBC, iron/TIBC, ferritin in June 2017. Patient call sooner if needed.  Laureen Ochs. Bernarda Caffey [facility] [phone] 3/28/20172:36 PM

## 2015-11-27 NOTE — Telephone Encounter (Signed)
Tried to call pt- NA- LMOM 

## 2015-11-27 NOTE — Telephone Encounter (Signed)
Pt is aware. He has not been taking iron. He said he didn't know he was supposed to be taking it. He will get some now. He will also go see his pcp about his B12 levels. Lab orders on file. Pt will call if he needs anything.

## 2015-12-26 DIAGNOSIS — N481 Balanitis: Secondary | ICD-10-CM | POA: Insufficient documentation

## 2015-12-26 DIAGNOSIS — N471 Phimosis: Secondary | ICD-10-CM | POA: Insufficient documentation

## 2016-01-19 ENCOUNTER — Other Ambulatory Visit: Payer: Self-pay

## 2016-01-19 DIAGNOSIS — D509 Iron deficiency anemia, unspecified: Secondary | ICD-10-CM

## 2016-02-09 ENCOUNTER — Other Ambulatory Visit: Payer: Self-pay | Admitting: Gastroenterology

## 2016-02-10 ENCOUNTER — Telehealth: Payer: Self-pay

## 2016-02-10 LAB — CBC WITH DIFFERENTIAL/PLATELET
BASOS: 1 %
Basophils Absolute: 0 10*3/uL (ref 0.0–0.2)
EOS (ABSOLUTE): 0.5 10*3/uL — AB (ref 0.0–0.4)
Eos: 8 %
Hematocrit: 37.5 % (ref 37.5–51.0)
Hemoglobin: 12.7 g/dL (ref 12.6–17.7)
IMMATURE GRANULOCYTES: 0 %
Immature Grans (Abs): 0 10*3/uL (ref 0.0–0.1)
LYMPHS ABS: 1.5 10*3/uL (ref 0.7–3.1)
Lymphs: 24 %
MCH: 28.8 pg (ref 26.6–33.0)
MCHC: 33.9 g/dL (ref 31.5–35.7)
MCV: 85 fL (ref 79–97)
MONOS ABS: 0.6 10*3/uL (ref 0.1–0.9)
Monocytes: 10 %
NEUTROS PCT: 57 %
Neutrophils Absolute: 3.5 10*3/uL (ref 1.4–7.0)
PLATELETS: 173 10*3/uL (ref 150–379)
RBC: 4.41 x10E6/uL (ref 4.14–5.80)
RDW: 19.1 % — AB (ref 12.3–15.4)
WBC: 6.2 10*3/uL (ref 3.4–10.8)

## 2016-02-10 LAB — IRON AND TIBC
IRON: 76 ug/dL (ref 38–169)
Iron Saturation: 18 % (ref 15–55)
Total Iron Binding Capacity: 419 ug/dL (ref 250–450)
UIBC: 343 ug/dL (ref 111–343)

## 2016-02-10 LAB — FERRITIN: Ferritin: 34 ng/mL (ref 30–400)

## 2016-02-10 NOTE — Telephone Encounter (Signed)
Abs from [facility] placed on [facility] desk

## 2016-02-13 NOTE — Progress Notes (Signed)
Quick Note:  Hgb and iron improving.  Continue iron supplement twice daily.  Repeat cbc, ferritin in 4 months. ______

## 2016-02-17 ENCOUNTER — Other Ambulatory Visit: Payer: Self-pay | Admitting: Gastroenterology

## 2016-02-17 DIAGNOSIS — D509 Iron deficiency anemia, unspecified: Secondary | ICD-10-CM

## 2016-05-19 ENCOUNTER — Other Ambulatory Visit: Payer: Self-pay

## 2016-05-19 DIAGNOSIS — D509 Iron deficiency anemia, unspecified: Secondary | ICD-10-CM

## 2016-06-14 LAB — IRON AND TIBC
%SAT: 32 % (ref 15–60)
IRON: 150 ug/dL (ref 50–180)
TIBC: 470 ug/dL — AB (ref 250–425)
UIBC: 320 ug/dL (ref 125–400)

## 2016-06-14 LAB — CBC WITH DIFFERENTIAL/PLATELET
BASOS PCT: 1 %
Basophils Absolute: 60 cells/uL (ref 0–200)
EOS ABS: 240 {cells}/uL (ref 15–500)
Eosinophils Relative: 4 %
HEMATOCRIT: 40.4 % (ref 38.5–50.0)
Hemoglobin: 12.7 g/dL — ABNORMAL LOW (ref 13.2–17.1)
Lymphocytes Relative: 30 %
Lymphs Abs: 1800 cells/uL (ref 850–3900)
MCH: 28.9 pg (ref 27.0–33.0)
MCHC: 31.4 g/dL — ABNORMAL LOW (ref 32.0–36.0)
MCV: 91.8 fL (ref 80.0–100.0)
MONO ABS: 540 {cells}/uL (ref 200–950)
MPV: 9 fL (ref 7.5–12.5)
Monocytes Relative: 9 %
NEUTROS ABS: 3360 {cells}/uL (ref 1500–7800)
Neutrophils Relative %: 56 %
PLATELETS: 219 10*3/uL (ref 140–400)
RBC: 4.4 MIL/uL (ref 4.20–5.80)
RDW: 14.9 % (ref 11.0–15.0)
WBC: 6 10*3/uL (ref 3.8–10.8)

## 2016-06-15 LAB — FERRITIN: FERRITIN: 32 ng/mL (ref 20–380)

## 2016-06-16 ENCOUNTER — Ambulatory Visit (INDEPENDENT_AMBULATORY_CARE_PROVIDER_SITE_OTHER): Payer: BC Managed Care – PPO | Admitting: Orthopaedic Surgery

## 2016-06-16 DIAGNOSIS — M25511 Pain in right shoulder: Secondary | ICD-10-CM

## 2016-06-22 ENCOUNTER — Telehealth (INDEPENDENT_AMBULATORY_CARE_PROVIDER_SITE_OTHER): Payer: Self-pay | Admitting: Radiology

## 2016-06-22 NOTE — Telephone Encounter (Signed)
Patient's wife Helene Kelp called, says that pateint is having severe right shoulder pain, had one injection on 05/18/16, he wants another one.  Pls call him to sched this appt,with CB.  Thanks-

## 2016-07-04 NOTE — Progress Notes (Signed)
Overall labs stable, mild anemia. He needs ifobt.  Ov with rmr only in 6-8 weeks for ida.

## 2016-07-07 ENCOUNTER — Encounter (INDEPENDENT_AMBULATORY_CARE_PROVIDER_SITE_OTHER): Payer: Self-pay | Admitting: Orthopaedic Surgery

## 2016-07-07 ENCOUNTER — Ambulatory Visit (INDEPENDENT_AMBULATORY_CARE_PROVIDER_SITE_OTHER): Payer: BC Managed Care – PPO | Admitting: Physician Assistant

## 2016-07-07 VITALS — Ht 73.0 in | Wt 268.0 lb

## 2016-07-07 DIAGNOSIS — M7541 Impingement syndrome of right shoulder: Secondary | ICD-10-CM | POA: Diagnosis not present

## 2016-07-07 MED ORDER — TRAMADOL HCL 50 MG PO TABS: 50.0000 mg | ORAL_TABLET | Freq: Four times a day (QID) | ORAL | 0 refills | Status: DC | PRN

## 2016-07-07 NOTE — Progress Notes (Signed)
Office Visit Note   Patient: Jonathan Grimes           Date of Birth: [birth year]           MRN: [id number] Visit Date: 07/07/2016              Requested by: Sharilyn Sites, MD [address] [place], [place] [address] PCP: Purvis Kilts, MD   Assessment & Plan: Visit Diagnoses:  1. Impingement syndrome of right shoulder     Plan: MRI right shoulder rule out rotator cuff tear.  Follow-Up Instructions: Return in about 2 weeks (around 07/21/2016) for after MRI. Continue pendulum exercises, wall crawls and Codman exercises.  Orders:  Orders Placed This Encounter  Procedures  . MR Shoulder Right w/ contrast   Meds ordered this encounter  Medications  . traMADol (ULTRAM) 50 MG tablet    Sig: Take 1 tablet (50 mg total) by mouth every 6 (six) hours as needed for moderate pain.    Dispense:  30 tablet    Refill:  0      Procedures: No procedures performed   Clinical Data: No additional findings.   Subjective: Chief Complaint  Patient presents with  . Right Shoulder - Pain    Patient presents today right shoulder pain. He was in the office on 05/18/16 and received a cortisone injection which did provide good relief for a while but now his symptoms have returned. He is having several sleep disturbances due to the pain, and states some nights he will only sleep for two hours. He feels like his range of motion is ok but is just painful. He takes tylenol and oxycodone prn pain. Pain is worse in the evening when he lays on right arm.    Review of Systems   Objective: Vital Signs: Ht 6\' 1"  (1.854 m)   Wt 268 lb (121.6 kg)   BMI 35.36 kg/m   Physical Exam  Constitutional: He is oriented to person, place, and time. He appears well-developed and well-nourished.  Pulmonary/Chest: Effort normal.  Neurological: He is alert and oriented to person, place, and time.    Ortho Exam Bilateral shoulders: 5 out of 5 strength with internal rotation against resistance,  right shoulder 4 out of 5 strength with external rotation against resistance, left shoulder 5 out of 5 strength with external rotation against resistance. Positive impingement test right shoulder negative on the left. Liftoff test negative bilaterally. Cross over test negative bilaterally. Specialty Comments:  No specialty comments available.  Imaging: No results found.   PMFS History: Patient Active Problem List   Diagnosis Date Noted  . Gastric erosion   . IDA (iron deficiency anemia)   . Heme positive stool   . B12 deficiency 11/04/2015  . Iron deficiency anemia   . Mucosal abnormality of stomach   . 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 Status post revision of total replacement of right knee 07/11/2015  . Arthritis of right hip 02/22/2014  . Status post THR (total hip replacement) 02/22/2014  . Degenerative arthritis of hip 07/28/2012  . GERD 01/01/2010  . HEMATOCHEZIA 01/01/2010  . DYSPHAGIA UNSPECIFIED 01/01/2010   Past Medical History:  Diagnosis Date  . Arthritis    SEVERE OA LEFT HIP WITH PAIN  . Diabetes mellitus without complication ([place])    type 2  . Elevated cholesterol   . Fatty liver   . GERD (gastroesophageal reflux disease)   . H/O hiatal hernia   . Hiatal hernia   .  Hypertension   . Schatzki's ring   . Sleep apnea    pt not officially diagnosed, but pt and family state he has it  . Tubular adenoma     No family history on file.  Past Surgical History:  Procedure Laterality Date  . BIOPSY N/A 10/02/2015   Procedure: BIOPSY;  Surgeon: Daneil Dolin, MD;  [facility];  Service: Endoscopy;  Laterality: N/A;  Gastric bx  . COLONOSCOPY  01/19/10   Dr.Rourk- friable anorectal. o/w normal rectum, L sided diverticula, sigmoid polyp bx= tubular adenoma,  . COLONOSCOPY N/A 10/02/2015   [id number] appearing rectal/scattered sigmoid diverticula, next TCS five years for h/o colon adenomas  . ESOPHAGOGASTRODUODENOSCOPY  01/19/10    Dr.Rourk- noncritical schatzki's ring, o/w normal esophagus, small hiatal hernia o/w normal stomach D1, D2  . ESOPHAGOGASTRODUODENOSCOPY N/A 10/02/2015   [id number] erosiona s/p bx (no h.pylri, gastric mucosa with intestinal metaplasia)  . GIVENS CAPSULE STUDY N/A 11/21/2015   Procedure: GIVENS CAPSULE STUDY;  Surgeon: Daneil Dolin, MD;  [facility]: [facility];  Service: Endoscopy;  Laterality: N/A;  0700  . JOINT REPLACEMENT  2012   RIGHT TOTAL KNEE ARTHROPLASTY  . TOTAL HIP ARTHROPLASTY  07/28/2012   Procedure: TOTAL HIP ARTHROPLASTY ANTERIOR APPROACH;  Surgeon: Mcarthur Rossetti, MD;  Location: [facility];  Service: Orthopedics;  Laterality: Left;  . TOTAL HIP ARTHROPLASTY Right 02/22/2014   Procedure: RIGHT TOTAL HIP ARTHROPLASTY ANTERIOR APPROACH;  Surgeon: Mcarthur Rossetti, MD;  Location: [facility];  Service: Orthopedics;  Laterality: Right;  . TOTAL KNEE REVISION Right 07/11/2015   Procedure: REVISION RIGHT TOTAL KNEE ARTHROPLASTYone component only;  Surgeon: Mcarthur Rossetti, MD;  Location: [facility];  Service: Orthopedics;  Laterality: Right;   Social History   Occupational History  . Not on file.   Social History Main Topics  . Smoking status: Former Smoker    Types: Cigarettes    Quit date: 01/06/1982  . Smokeless tobacco: Current User    Types: Chew     Comment: QUIT SMOKING 34 YRS AGO  QUIT CHEWING TOBACCO JAN 2012  . Alcohol use Yes     Comment: beer occasionally  . Drug use: No  . Sexual activity: Not on file

## 2016-07-08 ENCOUNTER — Encounter: Payer: Self-pay | Admitting: Internal Medicine

## 2016-07-08 NOTE — Addendum Note (Signed)
Addended by: Daylene Posey T on: 07/08/2016 02:29 PM   Modules accepted: Orders

## 2016-07-18 ENCOUNTER — Ambulatory Visit
Admission: RE | Admit: 2016-07-18 | Discharge: 2016-07-18 | Disposition: A | Discharge status: Home / Self Care | Payer: BC Managed Care – PPO | Source: Ambulatory Visit | Attending: Physician Assistant | Admitting: Physician Assistant

## 2016-07-18 DIAGNOSIS — M7541 Impingement syndrome of right shoulder: Secondary | ICD-10-CM

## 2016-07-29 ENCOUNTER — Ambulatory Visit (INDEPENDENT_AMBULATORY_CARE_PROVIDER_SITE_OTHER): Payer: BC Managed Care – PPO

## 2016-07-29 DIAGNOSIS — D509 Iron deficiency anemia, unspecified: Secondary | ICD-10-CM

## 2016-07-29 LAB — IFOBT (OCCULT BLOOD): IFOBT: POSITIVE

## 2016-07-30 ENCOUNTER — Encounter: Payer: Self-pay | Admitting: Internal Medicine

## 2016-07-30 ENCOUNTER — Ambulatory Visit (INDEPENDENT_AMBULATORY_CARE_PROVIDER_SITE_OTHER): Payer: BC Managed Care – PPO | Admitting: Internal Medicine

## 2016-07-30 VITALS — BP 152/84 | HR 84 | Temp 97.9°F | Ht 73.0 in | Wt 272.0 lb

## 2016-07-30 DIAGNOSIS — K219 Gastro-esophageal reflux disease without esophagitis: Secondary | ICD-10-CM | POA: Diagnosis not present

## 2016-07-30 DIAGNOSIS — D508 Other iron deficiency anemias: Secondary | ICD-10-CM | POA: Diagnosis not present

## 2016-07-30 NOTE — Progress Notes (Signed)
Primary Care Physician:  Purvis Kilts, MD Primary Gastroenterologist:  Dr. Gala Romney  Pre-Procedure History & Physical: HPI:  Jonathan Grimes is a 60 y.o. male here for B12/IDA. Patient had EGD, colonoscopy and capsule study earlier in the year which failed to demonstrate a significant lesion. Stool occult blood test positive. He hasn't had any melena, hematochezia or hematemesis. In fact, he is devoid of any GI symptoms aside from GERD  -  well controlled on omeprazole 40 mg daily.  He does continue take Voltaren daily for osteoarthritis.  Serum ferritin 32.  Stopped taking iron supplement earlier in the year. Continues on B12 supplement.  Past Medical History:  Diagnosis Date  . Arthritis    SEVERE OA LEFT HIP WITH PAIN  . Diabetes mellitus without complication ([place])    type 2  . Elevated cholesterol   . Fatty liver   . GERD (gastroesophageal reflux disease)   . H/O hiatal hernia   . Hiatal hernia   . Hypertension   . Schatzki's ring   . Sleep apnea    pt not officially diagnosed, but pt and family state he has it  . Tubular adenoma     Past Surgical History:  Procedure Laterality Date  . BIOPSY N/A 10/02/2015   Procedure: BIOPSY;  Surgeon: Daneil Dolin, MD;  [facility];  Service: Endoscopy;  Laterality: N/A;  Gastric bx  . COLONOSCOPY  01/19/10   Dr.Seraphina Mitchner- friable anorectal. o/w normal rectum, L sided diverticula, sigmoid polyp bx= tubular adenoma,  . COLONOSCOPY N/A 10/02/2015   [id number] appearing rectal/scattered sigmoid diverticula, next TCS five years for h/o colon adenomas  . ESOPHAGOGASTRODUODENOSCOPY  01/19/10   Dr.Izaih Kataoka- noncritical schatzki's ring, o/w normal esophagus, small hiatal hernia o/w normal stomach D1, D2  . ESOPHAGOGASTRODUODENOSCOPY N/A 10/02/2015   [id number] erosiona s/p bx (no h.pylri, gastric mucosa with intestinal metaplasia)  . GIVENS CAPSULE STUDY N/A 11/21/2015   Procedure: GIVENS CAPSULE STUDY;  Surgeon: Daneil Dolin, MD;   [facility];  Service: Endoscopy;  Laterality: N/A;  0700  . JOINT REPLACEMENT  2012   RIGHT TOTAL KNEE ARTHROPLASTY  . TOTAL HIP ARTHROPLASTY  07/28/2012   Procedure: TOTAL HIP ARTHROPLASTY ANTERIOR APPROACH;  Surgeon: Mcarthur Rossetti, MD;  [facility];  Service: Orthopedics;  Laterality: Left;  . TOTAL HIP ARTHROPLASTY Right 02/22/2014   Procedure: RIGHT TOTAL HIP ARTHROPLASTY ANTERIOR APPROACH;  Surgeon: Mcarthur Rossetti, MD;  [facility];  Service: Orthopedics;  Laterality: Right;  . TOTAL KNEE REVISION Right 07/11/2015   Procedure: REVISION RIGHT TOTAL KNEE ARTHROPLASTYone component only;  Surgeon: Mcarthur Rossetti, MD;  [facility];  Service: Orthopedics;  Laterality: Right;    Prior to Admission medications   Medication Sig Start Date End Date Taking? Authorizing Provider  aspirin EC 81 MG tablet Take 81 mg by mouth daily.   Yes Historical Provider, MD  atorvastatin (LIPITOR) 10 MG tablet Take 10 mg by mouth every morning.    Yes Historical Provider, MD  diclofenac (VOLTAREN) 75 MG EC tablet Take 75 mg by mouth 2 (two) times daily.   Yes Historical Provider, MD  losartan (COZAAR) 100 MG tablet Take 100 mg by mouth daily.   Yes Historical Provider, MD  MEGARED OMEGA-3 KRILL OIL PO Take 1 capsule by mouth daily.   Yes Historical Provider, MD  metFORMIN (GLUCOPHAGE) 500 MG tablet Take 500 mg by mouth 2 (two) times daily with a meal.   Yes Historical  Provider, MD  omeprazole (PRILOSEC) 40 MG capsule Take 40 mg by mouth daily.   Yes Historical Provider, MD  traMADol (ULTRAM) 50 MG tablet Take 1 tablet (50 mg total) by mouth every 6 (six) hours as needed for moderate pain. Patient not taking: Reported on 07/30/2016 07/07/16   Pete Pelt, PA-C    Allergies as of 07/30/2016  . (No Known Allergies)    No family history on file.  Social History   Social History  . Marital status: Married    Spouse name: N/A  . Number of children: N/A  .  Years of education: N/A   Occupational History  . Not on file.   Social History Main Topics  . Smoking status: Former Smoker    Types: Cigarettes    Quit date: 01/06/1982  . Smokeless tobacco: Current User    Types: Chew     Comment: QUIT SMOKING 34 YRS AGO  QUIT CHEWING TOBACCO JAN 2012  . Alcohol use Yes     Comment: beer occasionally  . Drug use: No  . Sexual activity: Not on file   Other Topics Concern  . Not on file   Social History Narrative  . No narrative on file    Review of Systems: See HPI, otherwise negative ROS  Physical Exam: BP (!) 152/84   Pulse 84   Temp 97.9 F (36.6 C) (Oral)   Ht 6\' 1"  (1.854 m)   Wt 272 lb (123.4 kg)   BMI 35.89 kg/m  General:   Alert,  Well-developed, well-nourished, pleasant and cooperative in NAD Skin:  Intact without significant lesions or rashes. Eyes:  Sclera clear, no icterus.   Conjunctiva pink. Ears:  Normal auditory acuity. Nose:  No deformity, discharge,  or lesions. Mouth:  No deformity or lesions. Neck:  Supple; no masses or thyromegaly. No significant cervical adenopathy. Lungs:  Clear throughout to auscultation.   No wheezes, crackles, or rhonchi. No acute distress. Heart:  Regular rate and rhythm; no murmurs, clicks, rubs,  or gallops. Abdomen: Non-distended, normal bowel sounds.  Soft and nontender without appreciable mass or hepatosplenomegaly.  Pulses:  Normal pulses noted. Extremities:  Without clubbing or edema.  Impression:   Pleasant 60 year old gentleman history of iron deficiency anemia and occult blood positive stool. He's had a thorough GI evaluation. His iron deficiency has rebounded. He is no longer taking iron. He is B12 deficient. I would continue to be concerned about the use of diclofenac. He states he doesn't always need it but takes daily anyway.  GERD-well controlled on omeprazole.  Recommendations:  Continue omeprazole 40 mg daily  Limit use of NSAID's (voltaren, etc) as much as  possible  CBC/ OV in 3 months     Notice: This dictation was prepared with Dragon dictation along with smaller phrase technology. Any transcriptional errors that result from this process are unintentional and may not be corrected upon review.

## 2016-07-30 NOTE — Patient Instructions (Signed)
Continue omeprazole 20 mg daily  Limit use of NSAID's (voltaren, etc) as much as possible  CBC/ OV in 3 months

## 2016-08-04 ENCOUNTER — Ambulatory Visit (INDEPENDENT_AMBULATORY_CARE_PROVIDER_SITE_OTHER): Payer: BC Managed Care – PPO | Admitting: Physician Assistant

## 2016-08-04 ENCOUNTER — Encounter (INDEPENDENT_AMBULATORY_CARE_PROVIDER_SITE_OTHER): Payer: Self-pay | Admitting: Physician Assistant

## 2016-08-04 DIAGNOSIS — M25511 Pain in right shoulder: Secondary | ICD-10-CM

## 2016-08-04 DIAGNOSIS — G8929 Other chronic pain: Secondary | ICD-10-CM | POA: Diagnosis not present

## 2016-08-04 MED ORDER — TRAMADOL HCL 50 MG PO TABS: 50.0000 mg | ORAL_TABLET | Freq: Four times a day (QID) | ORAL | 0 refills | Status: DC | PRN

## 2016-08-04 NOTE — Progress Notes (Signed)
Office Visit Note   Patient: Jonathan Grimes           Date of Birth: [birth year]           MRN: [id number] Visit Date: 08/04/2016              Requested by: Sharilyn Sites, MD [address] [place], [place] [address] PCP: Purvis Kilts, MD   Assessment & Plan: Visit Diagnoses:  1. Chronic right shoulder pain     Plan: This time recommend that he continue shoulder exercises. Did discuss with him both shoulder arthroscopy for extensive debridement SAD,AND DCR he defers at this time. He is currently not having pain to warrant a subacromial injection. Therefore will see him back on when necessary basis.  Follow-Up Instructions: Return if symptoms worsen or fail to improve.   Orders:  No orders of the defined types were placed in this encounter.  Meds ordered this encounter  Medications  . traMADol (ULTRAM) 50 MG tablet    Sig: Take 1 tablet (50 mg total) by mouth every 6 (six) hours as needed for moderate pain.    Dispense:  30 tablet    Refill:  0      Procedures: No procedures performed   Clinical Data: No additional findings.   Subjective: Chief Complaint  Patient presents with  . Right Shoulder - Follow-up  . Follow-up    Patient is here to discuss his MRI results. No concerns at this time. He states currently that the shoulder is doing well and has been performing exercises at home for her shoulder. He does ask for some tramadol for nighttime pain.    Review of Systems   Objective: Vital Signs: There were no vitals taken for this visit.  Physical Exam  Ortho Exam  Specialty Comments:  No specialty comments available.  Imaging: No results found. MRI results reviewed with the patient which showed no rotator cuff tear with rotator cuff tendinopathy is notably of the supraspinatus. So tendinopathy of the biceps long head without tear. Type II acromion and marked degeneration of the before meals joint.. Labrum was severe degeneration.  Glenohumeral joint unremarkable  PMFS History: Patient Active Problem List   Diagnosis Date Noted  . Gastric erosion   . IDA (iron deficiency anemia)   . Heme positive stool   . B12 deficiency 11/04/2015  . Iron deficiency anemia   . Mucosal abnormality of stomach   . 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 Status post revision of total replacement of right knee 07/11/2015  . Arthritis of right hip 02/22/2014  . Status post THR (total hip replacement) 02/22/2014  . Degenerative arthritis of hip 07/28/2012  . GERD 01/01/2010  . HEMATOCHEZIA 01/01/2010  . DYSPHAGIA UNSPECIFIED 01/01/2010   Past Medical History:  Diagnosis Date  . Arthritis    SEVERE OA LEFT HIP WITH PAIN  . Diabetes mellitus without complication ([place])    type 2  . Elevated cholesterol   . Fatty liver   . GERD (gastroesophageal reflux disease)   . H/O hiatal hernia   . Hiatal hernia   . Hypertension   . Schatzki's ring   . Sleep apnea    pt not officially diagnosed, but pt and family state he has it  . Tubular adenoma     No family history on file.  Past Surgical History:  Procedure Laterality Date  . BIOPSY N/A 10/02/2015   Procedure: BIOPSY;  Surgeon: Daneil Dolin, MD;  [facility];  Service: Endoscopy;  Laterality: N/A;  Gastric bx  . COLONOSCOPY  01/19/10   Dr.Rourk- friable anorectal. o/w normal rectum, L sided diverticula, sigmoid polyp bx= tubular adenoma,  . COLONOSCOPY N/A 10/02/2015   [id number] appearing rectal/scattered sigmoid diverticula, next TCS five years for h/o colon adenomas  . ESOPHAGOGASTRODUODENOSCOPY  01/19/10   Dr.Rourk- noncritical schatzki's ring, o/w normal esophagus, small hiatal hernia o/w normal stomach D1, D2  . ESOPHAGOGASTRODUODENOSCOPY N/A 10/02/2015   [id number] erosiona s/p bx (no h.pylri, gastric mucosa with intestinal metaplasia)  . GIVENS CAPSULE STUDY N/A 11/21/2015   Procedure: GIVENS CAPSULE STUDY;  Surgeon: Daneil Dolin, MD;  [facility];  Service: Endoscopy;  Laterality: N/A;  0700  . JOINT REPLACEMENT  2012   RIGHT TOTAL KNEE ARTHROPLASTY  . TOTAL HIP ARTHROPLASTY  07/28/2012   Procedure: TOTAL HIP ARTHROPLASTY ANTERIOR APPROACH;  Surgeon: Mcarthur Rossetti, MD;  [facility];  Service: Orthopedics;  Laterality: Left;  . TOTAL HIP ARTHROPLASTY Right 02/22/2014   Procedure: RIGHT TOTAL HIP ARTHROPLASTY ANTERIOR APPROACH;  Surgeon: Mcarthur Rossetti, MD;  [facility];  Service: Orthopedics;  Laterality: Right;  . TOTAL KNEE REVISION Right 07/11/2015   Procedure: REVISION RIGHT TOTAL KNEE ARTHROPLASTYone component only;  Surgeon: Mcarthur Rossetti, MD;  [facility];  Service: Orthopedics;  Laterality: Right;   Social History   Occupational History  . Not on file.   Social History Main Topics  . Smoking status: Former Smoker    Types: Cigarettes    Quit date: 01/06/1982  . Smokeless tobacco: Current User    Types: Chew     Comment: QUIT SMOKING 34 YRS AGO  QUIT CHEWING TOBACCO JAN 2012  . Alcohol use Yes     Comment: beer occasionally  . Drug use: No  . Sexual activity: Not on file

## 2016-08-08 NOTE — Progress Notes (Signed)
Addressed by rmr at time of ov.

## 2016-08-13 ENCOUNTER — Telehealth (INDEPENDENT_AMBULATORY_CARE_PROVIDER_SITE_OTHER): Payer: Self-pay

## 2016-08-13 NOTE — Telephone Encounter (Signed)
Wife left voice mail stating patient wanted to schedule surgery.  I called back and left voice mail for return call.  [phone]  Need blue sheet if okay to schedule.

## 2016-08-17 NOTE — Telephone Encounter (Signed)
Scheduled with wife for 08-26-16.

## 2016-08-20 ENCOUNTER — Ambulatory Visit: Payer: BLUE CROSS/BLUE SHIELD | Admitting: Internal Medicine

## 2016-08-26 ENCOUNTER — Encounter: Payer: Self-pay | Admitting: Orthopaedic Surgery

## 2016-08-26 DIAGNOSIS — M7541 Impingement syndrome of right shoulder: Secondary | ICD-10-CM | POA: Diagnosis not present

## 2016-08-26 DIAGNOSIS — M75121 Complete rotator cuff tear or rupture of right shoulder, not specified as traumatic: Secondary | ICD-10-CM | POA: Diagnosis not present

## 2016-09-01 ENCOUNTER — Ambulatory Visit (INDEPENDENT_AMBULATORY_CARE_PROVIDER_SITE_OTHER): Payer: BC Managed Care – PPO | Admitting: Orthopaedic Surgery

## 2016-09-01 ENCOUNTER — Encounter (INDEPENDENT_AMBULATORY_CARE_PROVIDER_SITE_OTHER): Payer: Self-pay | Admitting: Orthopaedic Surgery

## 2016-09-01 DIAGNOSIS — Z9889 Other specified postprocedural states: Secondary | ICD-10-CM

## 2016-09-01 NOTE — Progress Notes (Signed)
The patient is now one-week status post a right shoulder arthroscopy with arthroscopically assisted rotator cuff repair. He is doing well. He has been compliant with using his sling. He has no complaints.  On examination of his right shoulder sutures are intact. I removed the sutures without difficulty. His axillary nerve is functioning normally. He does show some integrity the rotator cuff.  At this point he'll start outpatient physical therapy going slow with the shoulder. He understands the recovery process can take 5-6 months. I'll have him participate in physical therapy for at least 6-8 weeks. We'll see him back in a month to see how is doing overall. I want him to wear the sling at least for 3 more weeks. He needs to avoid overhead lifting and reaching behind him with his right arm.

## 2016-09-06 ENCOUNTER — Telehealth (INDEPENDENT_AMBULATORY_CARE_PROVIDER_SITE_OTHER): Payer: Self-pay | Admitting: *Deleted

## 2016-09-06 NOTE — Telephone Encounter (Signed)
Pt wife called stating she is needing PT order and notes to [facility], states they will not accept the order that was given. [facility] fax # [phone].

## 2016-09-06 NOTE — Telephone Encounter (Signed)
Faxed PT order to [phone].

## 2016-09-06 NOTE — Telephone Encounter (Signed)
Laid Rx on your desk

## 2016-09-06 NOTE — Telephone Encounter (Signed)
Please advise 

## 2016-09-06 NOTE — Telephone Encounter (Signed)
See script for therapy

## 2016-09-08 ENCOUNTER — Telehealth (INDEPENDENT_AMBULATORY_CARE_PROVIDER_SITE_OTHER): Payer: Self-pay | Admitting: Radiology

## 2016-09-08 NOTE — Telephone Encounter (Signed)
Amy is calling for verbal orders for OT to go and see patient. He is status post RCR. She states that OT does all of their upper extremity rehab. They should only need it for a few weeks until patient can progress to outpatient therapy.

## 2016-09-08 NOTE — Telephone Encounter (Signed)
No lifting greater than 5 lbs with his surgically repaired shoulder.  Shoulder abduction only with the therapist.

## 2016-09-08 NOTE — Telephone Encounter (Signed)
Order given to PT.

## 2016-09-08 NOTE — Telephone Encounter (Signed)
Any precautions?

## 2016-09-09 ENCOUNTER — Telehealth (INDEPENDENT_AMBULATORY_CARE_PROVIDER_SITE_OTHER): Payer: Self-pay | Admitting: Orthopaedic Surgery

## 2016-09-09 MED ORDER — OXYCODONE-ACETAMINOPHEN 5-325 MG PO TABS: 1.0000 | ORAL_TABLET | Freq: Three times a day (TID) | ORAL | 0 refills | Status: DC | PRN

## 2016-09-09 NOTE — Telephone Encounter (Signed)
Please advise 

## 2016-09-09 NOTE — Telephone Encounter (Signed)
Can come and pick up a script 

## 2016-09-09 NOTE — Telephone Encounter (Signed)
Pt wife called in and stated pt shoulder is hurting and is requesting pain medication now. Stated they didn't use what was prescribed post surgery.   Helene Kelp [phone] home

## 2016-09-10 NOTE — Telephone Encounter (Signed)
LMOM for patient Rx ready at front desk 

## 2016-09-29 ENCOUNTER — Ambulatory Visit (INDEPENDENT_AMBULATORY_CARE_PROVIDER_SITE_OTHER): Payer: BC Managed Care – PPO | Admitting: Orthopaedic Surgery

## 2016-09-29 DIAGNOSIS — Z9889 Other specified postprocedural states: Secondary | ICD-10-CM

## 2016-09-29 MED ORDER — HYDROCODONE-ACETAMINOPHEN 5-325 MG PO TABS: 1.0000 | ORAL_TABLET | Freq: Two times a day (BID) | ORAL | 0 refills | Status: DC | PRN

## 2016-09-29 NOTE — Progress Notes (Signed)
The patient is now 4 weeks status post a right shoulder arthroscopy with a rotator cuff repair. He is going to physical therapy in they state that he is making great progress. His last outpatient therapy visit is tomorrow. He feels that he is doing well. His pain is still there but diminishing.  On examination of his right shoulder he does have good external rotation which is full. He has 4 out of 5 strength. His abduction is almost full but he definitely uses his deltoid still to abduct his shoulder which is to be expected at just over 4 weeks postop.  At this point we'll transition to home exercise program. I want him to be careful with the shoulder. We'll see him back for repeat exam in 4 weeks. I did refill his hydrocodone

## 2016-10-08 ENCOUNTER — Encounter: Payer: Self-pay | Admitting: Internal Medicine

## 2016-10-27 ENCOUNTER — Encounter (INDEPENDENT_AMBULATORY_CARE_PROVIDER_SITE_OTHER): Payer: Self-pay | Admitting: Orthopaedic Surgery

## 2016-10-27 ENCOUNTER — Ambulatory Visit (INDEPENDENT_AMBULATORY_CARE_PROVIDER_SITE_OTHER): Payer: BC Managed Care – PPO | Admitting: Physician Assistant

## 2016-10-27 DIAGNOSIS — Z9889 Other specified postprocedural states: Secondary | ICD-10-CM

## 2016-10-27 MED ORDER — HYDROCODONE-ACETAMINOPHEN 5-325 MG PO TABS: 1.0000 | ORAL_TABLET | Freq: Two times a day (BID) | ORAL | 0 refills | Status: DC | PRN

## 2016-10-27 NOTE — Progress Notes (Signed)
Office Visit Note   Patient: Jonathan Grimes           Date of Birth: 10/18/1955           MRN: [id number] Visit Date: 10/27/2016              Requested by: Sharilyn Sites, MD [address] [place], [place] [address] PCP: Purvis Kilts, MD   Assessment & Plan: Visit Diagnoses: Status post right shoulder rotator cuff repair  Plan: He will increase activities as tolerated. Follow-up with [place] on an as-needed basis.  Follow-Up Instructions: Return if symptoms worsen or fail to improve.   Orders:  No orders of the defined types were placed in this encounter.  Meds ordered this encounter  Medications  . HYDROcodone-acetaminophen (NORCO/VICODIN) 5-325 MG tablet    Sig: Take 1-2 tablets by mouth 2 (two) times daily as needed for moderate pain.    Dispense:  40 tablet    Refill:  0      Procedures: No procedures performed   Clinical Data: No additional findings.   Subjective: Chief Complaint  Patient presents with  . Right Shoulder - Pain    HPI Sterile Migneault returns today just 1 day shy of 9 weeks postop from her right shoulder rotator cuff repair. He states he is overall doing well. Has some pain particularly at night when he is lying down 3 out of 10 pain. But overall is very happy with the results. He has done physical therapy on his own.  Review of Systems   Objective: Vital Signs: There were no vitals taken for this visit.  Physical Exam  Ortho Exam Shoulder he has full forward flexion 180 full abduction. Negative impingement testing. 5 out of 5 strengths with external and internal rotation against resistance. Empty can test is negative bilaterally. Specialty Comments:  No specialty comments available.  Imaging: No results found.   PMFS History: Patient Active Problem List   Diagnosis Date Noted  . Status post right rotator cuff repair 09/01/2016  . Gastric erosion   . IDA (iron deficiency anemia)   . Heme positive stool   . B12  deficiency 11/04/2015  . Iron deficiency anemia   . Mucosal abnormality of stomach   . 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 Status post revision of total replacement of right knee 07/11/2015  . Arthritis of right hip 02/22/2014  . Status post THR (total hip replacement) 02/22/2014  . Degenerative arthritis of hip 07/28/2012  . GERD 01/01/2010  . HEMATOCHEZIA 01/01/2010  . DYSPHAGIA UNSPECIFIED 01/01/2010   Past Medical History:  Diagnosis Date  . Arthritis    SEVERE OA LEFT HIP WITH PAIN  . Diabetes mellitus without complication ([place])    type 2  . Elevated cholesterol   . Fatty liver   . GERD (gastroesophageal reflux disease)   . H/O hiatal hernia   . Hiatal hernia   . Hypertension   . Schatzki's ring   . Sleep apnea    pt not officially diagnosed, but pt and family state he has it  . Tubular adenoma     No family history on file.  Past Surgical History:  Procedure Laterality Date  . BIOPSY N/A 10/02/2015   Procedure: BIOPSY;  Surgeon: Daneil Dolin, MD;  [facility];  Service: Endoscopy;  Laterality: N/A;  Gastric bx  . COLONOSCOPY  01/19/10   Dr.Rourk- friable anorectal. o/w normal rectum, L sided diverticula, sigmoid polyp bx= tubular adenoma,  .  COLONOSCOPY N/A 10/02/2015   [id number] appearing rectal/scattered sigmoid diverticula, next TCS five years for h/o colon adenomas  . ESOPHAGOGASTRODUODENOSCOPY  01/19/10   Dr.Rourk- noncritical schatzki's ring, o/w normal esophagus, small hiatal hernia o/w normal stomach D1, D2  . ESOPHAGOGASTRODUODENOSCOPY N/A 10/02/2015   [id number] erosiona s/p bx (no h.pylri, gastric mucosa with intestinal metaplasia)  . GIVENS CAPSULE STUDY N/A 11/21/2015   Procedure: GIVENS CAPSULE STUDY;  Surgeon: Daneil Dolin, MD;  [facility];  Service: Endoscopy;  Laterality: N/A;  0700  . JOINT REPLACEMENT  2012   RIGHT TOTAL KNEE ARTHROPLASTY  . TOTAL HIP ARTHROPLASTY  07/28/2012   Procedure: TOTAL HIP  ARTHROPLASTY ANTERIOR APPROACH;  Surgeon: Mcarthur Rossetti, MD;  [facility];  Service: Orthopedics;  Laterality: Left;  . TOTAL HIP ARTHROPLASTY Right 02/22/2014   Procedure: RIGHT TOTAL HIP ARTHROPLASTY ANTERIOR APPROACH;  Surgeon: Mcarthur Rossetti, MD;  [facility];  Service: Orthopedics;  Laterality: Right;  . TOTAL KNEE REVISION Right 07/11/2015   Procedure: REVISION RIGHT TOTAL KNEE ARTHROPLASTYone component only;  Surgeon: Mcarthur Rossetti, MD;  [facility];  Service: Orthopedics;  Laterality: Right;   Social History   Occupational History  . Not on file.   Social History Main Topics  . Smoking status: Former Smoker    Types: Cigarettes    Quit date: 01/06/1982  . Smokeless tobacco: Current User    Types: Chew     Comment: QUIT SMOKING 34 YRS AGO  QUIT CHEWING TOBACCO JAN 2012  . Alcohol use Yes     Comment: beer occasionally  . Drug use: No  . Sexual activity: Not on file

## 2017-01-31 DIAGNOSIS — E782 Mixed hyperlipidemia: Secondary | ICD-10-CM | POA: Diagnosis not present

## 2017-01-31 DIAGNOSIS — M150 Primary generalized (osteo)arthritis: Secondary | ICD-10-CM | POA: Diagnosis not present

## 2017-01-31 DIAGNOSIS — I10 Essential (primary) hypertension: Secondary | ICD-10-CM | POA: Diagnosis not present

## 2017-01-31 DIAGNOSIS — Z6835 Body mass index (BMI) 35.0-35.9, adult: Secondary | ICD-10-CM | POA: Diagnosis not present

## 2017-01-31 DIAGNOSIS — B029 Zoster without complications: Secondary | ICD-10-CM | POA: Diagnosis not present

## 2017-01-31 DIAGNOSIS — Z1389 Encounter for screening for other disorder: Secondary | ICD-10-CM | POA: Diagnosis not present

## 2017-03-03 DIAGNOSIS — Z1389 Encounter for screening for other disorder: Secondary | ICD-10-CM | POA: Diagnosis not present

## 2017-03-03 DIAGNOSIS — Z6835 Body mass index (BMI) 35.0-35.9, adult: Secondary | ICD-10-CM | POA: Diagnosis not present

## 2017-03-03 DIAGNOSIS — E785 Hyperlipidemia, unspecified: Secondary | ICD-10-CM | POA: Diagnosis not present

## 2017-03-03 DIAGNOSIS — I10 Essential (primary) hypertension: Secondary | ICD-10-CM | POA: Diagnosis not present

## 2017-03-03 DIAGNOSIS — E1165 Type 2 diabetes mellitus with hyperglycemia: Secondary | ICD-10-CM | POA: Diagnosis not present

## 2017-03-03 DIAGNOSIS — E538 Deficiency of other specified B group vitamins: Secondary | ICD-10-CM | POA: Diagnosis not present

## 2017-03-09 DIAGNOSIS — H5200 Hypermetropia, unspecified eye: Secondary | ICD-10-CM | POA: Diagnosis not present

## 2017-03-09 DIAGNOSIS — Z0100 Encounter for examination of eyes and vision without abnormal findings: Secondary | ICD-10-CM | POA: Diagnosis not present

## 2017-03-09 DIAGNOSIS — H25813 Combined forms of age-related cataract, bilateral: Secondary | ICD-10-CM | POA: Diagnosis not present

## 2017-03-17 DIAGNOSIS — M2550 Pain in unspecified joint: Secondary | ICD-10-CM | POA: Diagnosis not present

## 2017-03-17 DIAGNOSIS — M542 Cervicalgia: Secondary | ICD-10-CM | POA: Diagnosis not present

## 2017-03-17 DIAGNOSIS — M109 Gout, unspecified: Secondary | ICD-10-CM | POA: Diagnosis not present

## 2017-03-17 DIAGNOSIS — M150 Primary generalized (osteo)arthritis: Secondary | ICD-10-CM | POA: Diagnosis not present

## 2017-04-08 DIAGNOSIS — M19072 Primary osteoarthritis, left ankle and foot: Secondary | ICD-10-CM | POA: Diagnosis not present

## 2017-04-08 DIAGNOSIS — M722 Plantar fascial fibromatosis: Secondary | ICD-10-CM | POA: Diagnosis not present

## 2017-04-08 DIAGNOSIS — M19071 Primary osteoarthritis, right ankle and foot: Secondary | ICD-10-CM | POA: Diagnosis not present

## 2017-04-08 DIAGNOSIS — M79672 Pain in left foot: Secondary | ICD-10-CM | POA: Diagnosis not present

## 2017-04-08 DIAGNOSIS — M79671 Pain in right foot: Secondary | ICD-10-CM | POA: Diagnosis not present

## 2017-04-08 DIAGNOSIS — D510 Vitamin B12 deficiency anemia due to intrinsic factor deficiency: Secondary | ICD-10-CM | POA: Diagnosis not present

## 2017-04-08 DIAGNOSIS — G5762 Lesion of plantar nerve, left lower limb: Secondary | ICD-10-CM | POA: Diagnosis not present

## 2017-04-08 DIAGNOSIS — G5761 Lesion of plantar nerve, right lower limb: Secondary | ICD-10-CM | POA: Diagnosis not present

## 2017-04-15 DIAGNOSIS — Z6834 Body mass index (BMI) 34.0-34.9, adult: Secondary | ICD-10-CM | POA: Diagnosis not present

## 2017-04-15 DIAGNOSIS — E785 Hyperlipidemia, unspecified: Secondary | ICD-10-CM | POA: Diagnosis not present

## 2017-04-15 DIAGNOSIS — M1991 Primary osteoarthritis, unspecified site: Secondary | ICD-10-CM | POA: Diagnosis not present

## 2017-04-15 DIAGNOSIS — E538 Deficiency of other specified B group vitamins: Secondary | ICD-10-CM | POA: Diagnosis not present

## 2017-04-15 DIAGNOSIS — K219 Gastro-esophageal reflux disease without esophagitis: Secondary | ICD-10-CM | POA: Diagnosis not present

## 2017-04-15 DIAGNOSIS — E1165 Type 2 diabetes mellitus with hyperglycemia: Secondary | ICD-10-CM | POA: Diagnosis not present

## 2017-04-15 DIAGNOSIS — I10 Essential (primary) hypertension: Secondary | ICD-10-CM | POA: Diagnosis not present

## 2017-04-15 DIAGNOSIS — D6949 Other primary thrombocytopenia: Secondary | ICD-10-CM | POA: Diagnosis not present

## 2017-04-18 DIAGNOSIS — I10 Essential (primary) hypertension: Secondary | ICD-10-CM | POA: Diagnosis not present

## 2017-04-18 DIAGNOSIS — E1165 Type 2 diabetes mellitus with hyperglycemia: Secondary | ICD-10-CM | POA: Diagnosis not present

## 2017-04-18 DIAGNOSIS — E6609 Other obesity due to excess calories: Secondary | ICD-10-CM | POA: Diagnosis not present

## 2017-04-18 DIAGNOSIS — E782 Mixed hyperlipidemia: Secondary | ICD-10-CM | POA: Diagnosis not present

## 2017-04-18 DIAGNOSIS — K219 Gastro-esophageal reflux disease without esophagitis: Secondary | ICD-10-CM | POA: Diagnosis not present

## 2017-04-18 DIAGNOSIS — Z6834 Body mass index (BMI) 34.0-34.9, adult: Secondary | ICD-10-CM | POA: Diagnosis not present

## 2017-04-22 DIAGNOSIS — M722 Plantar fascial fibromatosis: Secondary | ICD-10-CM | POA: Diagnosis not present

## 2017-04-22 DIAGNOSIS — M25572 Pain in left ankle and joints of left foot: Secondary | ICD-10-CM | POA: Diagnosis not present

## 2017-05-11 DIAGNOSIS — E538 Deficiency of other specified B group vitamins: Secondary | ICD-10-CM | POA: Diagnosis not present

## 2017-05-16 DIAGNOSIS — M25572 Pain in left ankle and joints of left foot: Secondary | ICD-10-CM | POA: Diagnosis not present

## 2017-05-16 DIAGNOSIS — M65872 Other synovitis and tenosynovitis, left ankle and foot: Secondary | ICD-10-CM | POA: Diagnosis not present

## 2017-05-30 DIAGNOSIS — G609 Hereditary and idiopathic neuropathy, unspecified: Secondary | ICD-10-CM | POA: Diagnosis not present

## 2017-05-30 DIAGNOSIS — M792 Neuralgia and neuritis, unspecified: Secondary | ICD-10-CM | POA: Diagnosis not present

## 2017-06-02 DIAGNOSIS — Z23 Encounter for immunization: Secondary | ICD-10-CM | POA: Diagnosis not present

## 2017-06-02 DIAGNOSIS — E538 Deficiency of other specified B group vitamins: Secondary | ICD-10-CM | POA: Diagnosis not present

## 2017-06-09 DIAGNOSIS — M792 Neuralgia and neuritis, unspecified: Secondary | ICD-10-CM | POA: Diagnosis not present

## 2017-07-13 DIAGNOSIS — E538 Deficiency of other specified B group vitamins: Secondary | ICD-10-CM | POA: Diagnosis not present

## 2017-07-18 ENCOUNTER — Encounter (INDEPENDENT_AMBULATORY_CARE_PROVIDER_SITE_OTHER): Payer: Self-pay | Admitting: Orthopaedic Surgery

## 2017-07-18 ENCOUNTER — Ambulatory Visit (INDEPENDENT_AMBULATORY_CARE_PROVIDER_SITE_OTHER): Payer: Medicare HMO | Admitting: Orthopaedic Surgery

## 2017-07-18 ENCOUNTER — Ambulatory Visit (INDEPENDENT_AMBULATORY_CARE_PROVIDER_SITE_OTHER): Payer: Medicare HMO

## 2017-07-18 DIAGNOSIS — M25561 Pain in right knee: Secondary | ICD-10-CM | POA: Diagnosis not present

## 2017-07-18 DIAGNOSIS — G8929 Other chronic pain: Secondary | ICD-10-CM

## 2017-07-18 DIAGNOSIS — Z96651 Presence of right artificial knee joint: Secondary | ICD-10-CM | POA: Diagnosis not present

## 2017-07-18 MED ORDER — TRAMADOL HCL 50 MG PO TABS: 50.0000 mg | ORAL_TABLET | Freq: Three times a day (TID) | ORAL | 0 refills | Status: DC | PRN

## 2017-07-18 NOTE — Progress Notes (Signed)
Office Visit Note   Patient: Jonathan Grimes           Date of Birth: [birth year]           MRN: [id number] Visit Date: 07/18/2017              Requested by: Sharilyn Sites, [address], [place] [address] PCP: Sharilyn Sites, MD   Assessment & Plan: Visit Diagnoses:  1. Chronic pain of right knee   2. Status post revision of total replacement of right knee     Plan: I gave him reassurance that thus far the plain films of his right knee revision look okay.  Since this is acute pain from an acute event we will try just a hinged knee brace for support since he feels like the knee is giving way on him and some tramadol for pain since he cannot take anti-inflammatories or Tylenol.  I like see him back in a month for repeat exam but no x-rays are needed.  All questions concerns were answered and addressed.  Follow-Up Instructions: Return in about 4 weeks (around 08/15/2017).   Orders:  Orders Placed This Encounter  Procedures  . XR Knee 1-2 Views Right   Meds ordered this encounter  Medications  . traMADol (ULTRAM) 50 MG tablet    Sig: Take 1-2 tablets (50-100 mg total) 3 (three) times daily as needed by mouth.    Dispense:  60 tablet    Refill:  0      Procedures: No procedures performed   Clinical Data: No additional findings.   Subjective: Chief Complaint  Patient presents with  . Right Knee - Pain  The patient is a very pleasant 61 year old gentleman who is 2 years out from a revision right total knee arthroplasty.  His original knee was replaced in [place].  We performed a revision arthroplasty of his original right total knee replacement due to aseptic loosening.  He has been pain-free for the last 2 years until about 5 weeks ago when he sustained a mechanical fall injuring that knee.  Over the last 2 weeks has been having worsening pain and almost symptoms like the knee is swelling and giving out on him.  He is a diabetic and reports good  control.  HPI  Review of Systems He currently denies any fever, chills, nausea, vomiting, chest pain, shortness of breath, headache  Objective: Vital Signs: There were no vitals taken for this visit.  Physical Exam He is alert and oriented x3 and in no acute distress Ortho Exam Examination of his right knee today shows no effusion at all.  Is not warm.  His range of motion is full in the knee feels like a mostly stable.  There is no redness and his incisions well-healed. Specialty Comments:  No specialty comments available.  Imaging: Xr Knee 1-2 Views Right  Result Date: 07/18/2017 2 views of the right knee show a total knee revision arthroplasty.  As far as the joint level goes there is no complicating features.  The distal aspect of the tibial component is not seen on these films.  There is no effusion.  There is no evidence of fracture or acute findings.  Compared to previous films there is no gross findings in terms of loosening.    PMFS History: Patient Active Problem List   Diagnosis Date Noted  . Status post right rotator cuff repair 09/01/2016  . Gastric erosion   . IDA (iron deficiency anemia)   .  Heme positive stool   . B12 deficiency 11/04/2015  . Iron deficiency anemia   . Mucosal abnormality of stomach   . 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 Status post revision of total replacement of right knee 07/11/2015  . Arthritis of right hip 02/22/2014  . Status post THR (total hip replacement) 02/22/2014  . Degenerative arthritis of hip 07/28/2012  . GERD 01/01/2010  . HEMATOCHEZIA 01/01/2010  . DYSPHAGIA UNSPECIFIED 01/01/2010   Past Medical History:  Diagnosis Date  . Arthritis    SEVERE OA LEFT HIP WITH PAIN  . Diabetes mellitus without complication ([place])    type 2  . Elevated cholesterol   . Fatty liver   . GERD (gastroesophageal reflux disease)   . H/O hiatal hernia   . Hiatal hernia   . Hypertension   . Schatzki's ring   . Sleep  apnea    pt not officially diagnosed, but pt and family state he has it  . Tubular adenoma     No family history on file.  Past Surgical History:  Procedure Laterality Date  . BIOPSY N/A 10/02/2015   Performed by Daneil Dolin, MD at [facility]  . COLONOSCOPY  01/19/10   Dr.Rourk- friable anorectal. o/w normal rectum, L sided diverticula, sigmoid polyp bx= tubular adenoma,  . COLONOSCOPY N/A 10/02/2015   Performed by Daneil Dolin, MD at [facility]  . ESOPHAGOGASTRODUODENOSCOPY  01/19/10   Dr.Rourk- noncritical schatzki's ring, o/w normal esophagus, small hiatal hernia o/w normal stomach D1, D2  . ESOPHAGOGASTRODUODENOSCOPY (EGD) N/A 10/02/2015   Performed by Daneil Dolin, MD at [facility]  . GIVENS CAPSULE STUDY N/A 11/21/2015   Performed by Daneil Dolin, MD at [facility]  . JOINT REPLACEMENT  2012   RIGHT TOTAL KNEE ARTHROPLASTY  . REVISION RIGHT TOTAL KNEE ARTHROPLASTYone component only Right 07/11/2015   Performed by Mcarthur Rossetti, MD at [facility]  . RIGHT TOTAL HIP ARTHROPLASTY ANTERIOR APPROACH Right 02/22/2014   Performed by Mcarthur Rossetti, MD at [facility]  . TOTAL HIP ARTHROPLASTY ANTERIOR APPROACH Left 07/28/2012   Performed by Mcarthur Rossetti, MD at [facility]   Social History   Occupational History  . Not on file  Tobacco Use  . Smoking status: Former Smoker    Types: Cigarettes    Last attempt to quit: 01/06/1982    Years since quitting: 35.5  . Smokeless tobacco: Current User    Types: Chew  . Tobacco comment: QUIT SMOKING [place] 2012  Substance and Sexual Activity  . Alcohol use: Yes    Comment: beer occasionally  . Drug use: No  . Sexual activity: Not on file

## 2017-07-26 DIAGNOSIS — E782 Mixed hyperlipidemia: Secondary | ICD-10-CM | POA: Diagnosis not present

## 2017-07-26 DIAGNOSIS — Z6836 Body mass index (BMI) 36.0-36.9, adult: Secondary | ICD-10-CM | POA: Diagnosis not present

## 2017-07-26 DIAGNOSIS — Z0001 Encounter for general adult medical examination with abnormal findings: Secondary | ICD-10-CM | POA: Diagnosis not present

## 2017-07-26 DIAGNOSIS — M1990 Unspecified osteoarthritis, unspecified site: Secondary | ICD-10-CM | POA: Diagnosis not present

## 2017-07-26 DIAGNOSIS — E1165 Type 2 diabetes mellitus with hyperglycemia: Secondary | ICD-10-CM | POA: Diagnosis not present

## 2017-07-26 DIAGNOSIS — R945 Abnormal results of liver function studies: Secondary | ICD-10-CM | POA: Diagnosis not present

## 2017-07-26 DIAGNOSIS — I10 Essential (primary) hypertension: Secondary | ICD-10-CM | POA: Diagnosis not present

## 2017-07-26 DIAGNOSIS — Z1389 Encounter for screening for other disorder: Secondary | ICD-10-CM | POA: Diagnosis not present

## 2017-08-17 ENCOUNTER — Ambulatory Visit (INDEPENDENT_AMBULATORY_CARE_PROVIDER_SITE_OTHER): Payer: Medicare HMO | Admitting: Orthopaedic Surgery

## 2017-08-31 DIAGNOSIS — E538 Deficiency of other specified B group vitamins: Secondary | ICD-10-CM | POA: Diagnosis not present

## 2017-09-15 DIAGNOSIS — M25572 Pain in left ankle and joints of left foot: Secondary | ICD-10-CM | POA: Diagnosis not present

## 2017-09-15 DIAGNOSIS — M65872 Other synovitis and tenosynovitis, left ankle and foot: Secondary | ICD-10-CM | POA: Diagnosis not present

## 2017-10-03 DIAGNOSIS — D510 Vitamin B12 deficiency anemia due to intrinsic factor deficiency: Secondary | ICD-10-CM | POA: Diagnosis not present

## 2017-10-24 DIAGNOSIS — Z6836 Body mass index (BMI) 36.0-36.9, adult: Secondary | ICD-10-CM | POA: Diagnosis not present

## 2017-10-24 DIAGNOSIS — J0100 Acute maxillary sinusitis, unspecified: Secondary | ICD-10-CM | POA: Diagnosis not present

## 2017-10-24 DIAGNOSIS — E6609 Other obesity due to excess calories: Secondary | ICD-10-CM | POA: Diagnosis not present

## 2017-11-03 DIAGNOSIS — E538 Deficiency of other specified B group vitamins: Secondary | ICD-10-CM | POA: Diagnosis not present

## 2017-12-07 DIAGNOSIS — E538 Deficiency of other specified B group vitamins: Secondary | ICD-10-CM | POA: Diagnosis not present

## 2017-12-27 DIAGNOSIS — E6609 Other obesity due to excess calories: Secondary | ICD-10-CM | POA: Diagnosis not present

## 2017-12-27 DIAGNOSIS — J019 Acute sinusitis, unspecified: Secondary | ICD-10-CM | POA: Diagnosis not present

## 2017-12-27 DIAGNOSIS — Z6836 Body mass index (BMI) 36.0-36.9, adult: Secondary | ICD-10-CM | POA: Diagnosis not present

## 2017-12-27 DIAGNOSIS — Z1389 Encounter for screening for other disorder: Secondary | ICD-10-CM | POA: Diagnosis not present

## 2017-12-27 DIAGNOSIS — E119 Type 2 diabetes mellitus without complications: Secondary | ICD-10-CM | POA: Diagnosis not present

## 2017-12-28 DIAGNOSIS — E6609 Other obesity due to excess calories: Secondary | ICD-10-CM | POA: Diagnosis not present

## 2017-12-28 DIAGNOSIS — E119 Type 2 diabetes mellitus without complications: Secondary | ICD-10-CM | POA: Diagnosis not present

## 2017-12-28 DIAGNOSIS — Z1389 Encounter for screening for other disorder: Secondary | ICD-10-CM | POA: Diagnosis not present

## 2017-12-28 DIAGNOSIS — Z6836 Body mass index (BMI) 36.0-36.9, adult: Secondary | ICD-10-CM | POA: Diagnosis not present

## 2018-01-10 DIAGNOSIS — E538 Deficiency of other specified B group vitamins: Secondary | ICD-10-CM | POA: Diagnosis not present

## 2018-02-13 DIAGNOSIS — E538 Deficiency of other specified B group vitamins: Secondary | ICD-10-CM | POA: Diagnosis not present

## 2018-03-13 DIAGNOSIS — E538 Deficiency of other specified B group vitamins: Secondary | ICD-10-CM | POA: Diagnosis not present

## 2018-04-07 DIAGNOSIS — E6609 Other obesity due to excess calories: Secondary | ICD-10-CM | POA: Diagnosis not present

## 2018-04-07 DIAGNOSIS — E785 Hyperlipidemia, unspecified: Secondary | ICD-10-CM | POA: Diagnosis not present

## 2018-04-07 DIAGNOSIS — Z1389 Encounter for screening for other disorder: Secondary | ICD-10-CM | POA: Diagnosis not present

## 2018-04-07 DIAGNOSIS — I10 Essential (primary) hypertension: Secondary | ICD-10-CM | POA: Diagnosis not present

## 2018-04-07 DIAGNOSIS — D6949 Other primary thrombocytopenia: Secondary | ICD-10-CM | POA: Diagnosis not present

## 2018-04-07 DIAGNOSIS — R945 Abnormal results of liver function studies: Secondary | ICD-10-CM | POA: Diagnosis not present

## 2018-04-07 DIAGNOSIS — Z6836 Body mass index (BMI) 36.0-36.9, adult: Secondary | ICD-10-CM | POA: Diagnosis not present

## 2018-04-07 DIAGNOSIS — E538 Deficiency of other specified B group vitamins: Secondary | ICD-10-CM | POA: Diagnosis not present

## 2018-04-07 DIAGNOSIS — E1165 Type 2 diabetes mellitus with hyperglycemia: Secondary | ICD-10-CM | POA: Diagnosis not present

## 2018-04-10 DIAGNOSIS — E782 Mixed hyperlipidemia: Secondary | ICD-10-CM | POA: Diagnosis not present

## 2018-04-14 DIAGNOSIS — D510 Vitamin B12 deficiency anemia due to intrinsic factor deficiency: Secondary | ICD-10-CM | POA: Diagnosis not present

## 2018-05-16 DIAGNOSIS — E538 Deficiency of other specified B group vitamins: Secondary | ICD-10-CM | POA: Diagnosis not present

## 2018-06-14 DIAGNOSIS — E538 Deficiency of other specified B group vitamins: Secondary | ICD-10-CM | POA: Diagnosis not present

## 2018-07-17 DIAGNOSIS — E538 Deficiency of other specified B group vitamins: Secondary | ICD-10-CM | POA: Diagnosis not present

## 2018-08-03 DIAGNOSIS — Z6836 Body mass index (BMI) 36.0-36.9, adult: Secondary | ICD-10-CM | POA: Diagnosis not present

## 2018-08-03 DIAGNOSIS — J343 Hypertrophy of nasal turbinates: Secondary | ICD-10-CM | POA: Diagnosis not present

## 2018-08-03 DIAGNOSIS — Z0001 Encounter for general adult medical examination with abnormal findings: Secondary | ICD-10-CM | POA: Diagnosis not present

## 2018-08-03 DIAGNOSIS — E119 Type 2 diabetes mellitus without complications: Secondary | ICD-10-CM | POA: Diagnosis not present

## 2018-08-03 DIAGNOSIS — E6609 Other obesity due to excess calories: Secondary | ICD-10-CM | POA: Diagnosis not present

## 2018-08-17 DIAGNOSIS — E538 Deficiency of other specified B group vitamins: Secondary | ICD-10-CM | POA: Diagnosis not present

## 2018-08-21 DIAGNOSIS — L308 Other specified dermatitis: Secondary | ICD-10-CM | POA: Diagnosis not present

## 2018-09-19 DIAGNOSIS — D510 Vitamin B12 deficiency anemia due to intrinsic factor deficiency: Secondary | ICD-10-CM | POA: Diagnosis not present

## 2018-10-19 DIAGNOSIS — E538 Deficiency of other specified B group vitamins: Secondary | ICD-10-CM | POA: Diagnosis not present

## 2018-11-03 IMAGING — MR MR SHOULDER*R* W/O CM
5 series · 37 of 40 positions shown · non-contrast
Comparison: None.

CLINICAL DATA: Right shoulder pain for 8 weeks.  No known injury.

EXAM:
MRI OF THE RIGHT SHOULDER WITHOUT CONTRAST
TECHNIQUE: Multiplanar, multisequence MR imaging of the shoulder was performed.
No intravenous contrast was administered.

[Series 6: T2 fat-sat · axial · right · 3.0mm · 0.44mm/px · z∈[-46,+49]mm · 9 of 27 slices shown (1 of 3)]
[im 1/27]
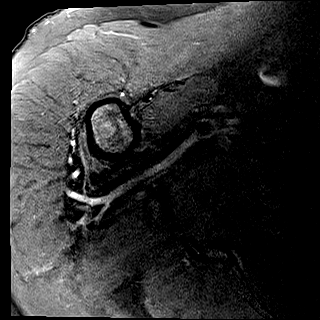
[im 4/27]
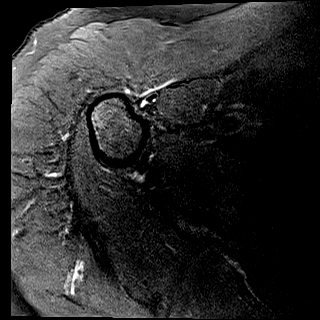
[im 7/27]
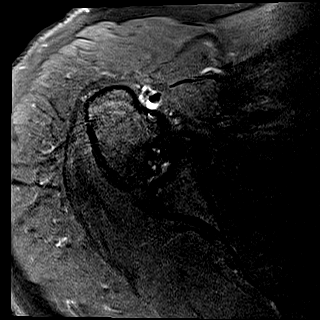
[im 10/27]
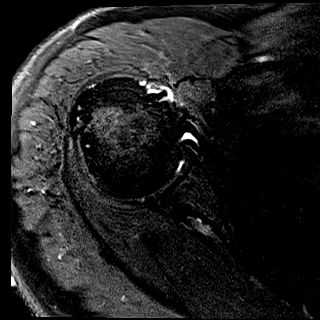
[im 14/27]
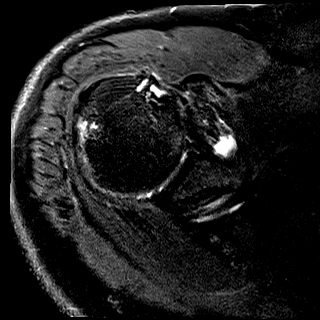
[im 17/27]
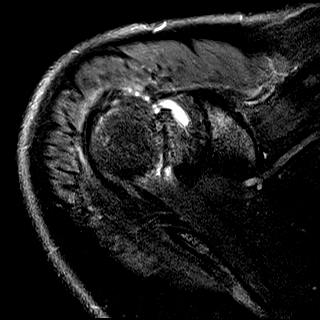
[im 20/27]
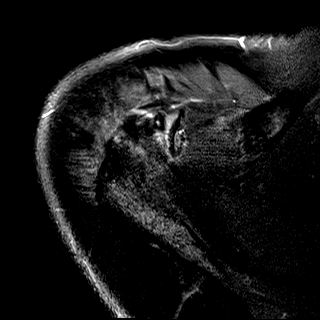
[im 23/27]
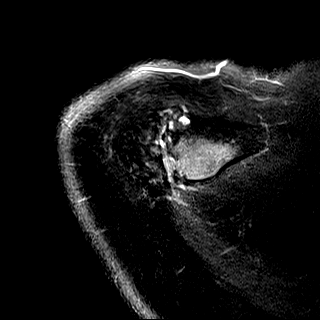
[im 27/27]
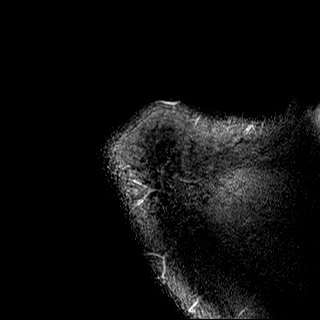

[Series 7: T2 fat-sat · oblique · right · 3.0mm · 0.55mm/px · 7 of 21 slices shown (2 of 3)]
[im 1/21]
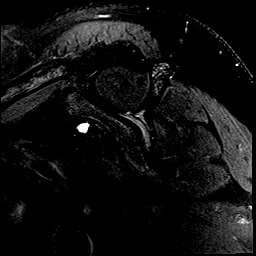
[im 4/21]
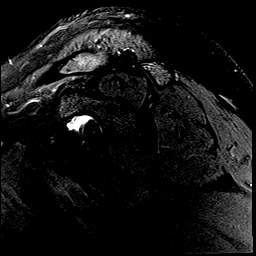
[im 7/21]
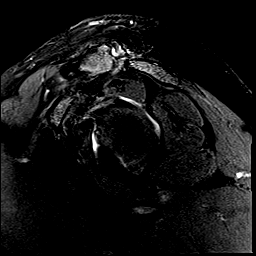
[im 11/21]
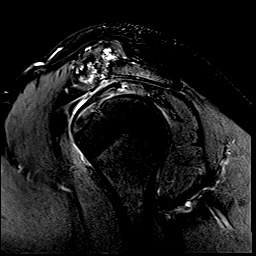
[im 14/21]
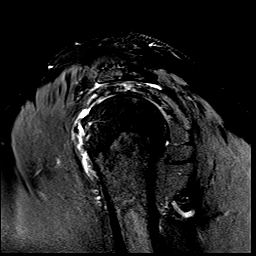
[im 17/21]
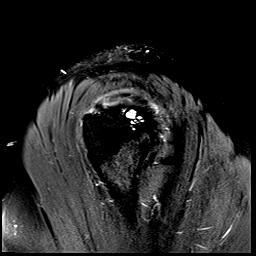
[im 21/21]
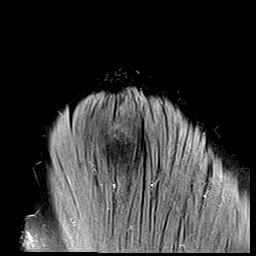

[Series 8: T1 · oblique · right · 3.0mm · 0.44mm/px · 5 of 21 slices shown]
[im 1/21]
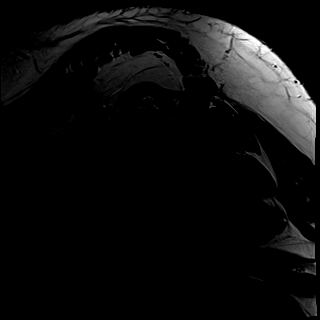
[im 3/21]
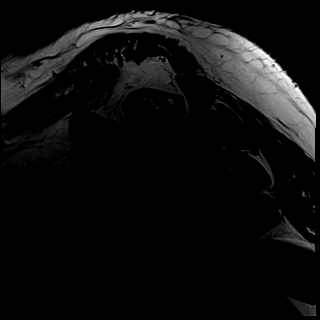
[im 6/21]
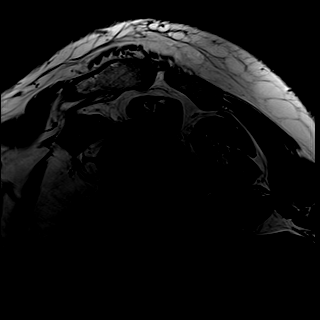
[im 9/21]
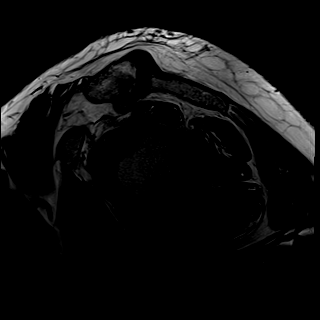
[im 12/21]
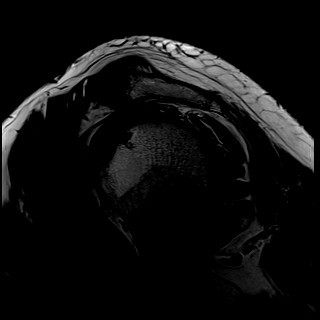

[Series 9: PD · oblique · right · 3.0mm · 0.44mm/px · 8 of 21 slices shown]
[im 1/21]
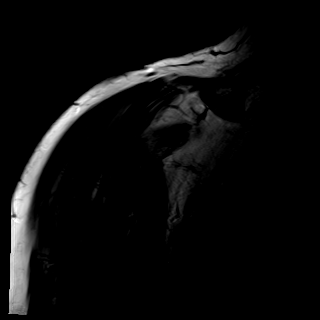
[im 3/21]
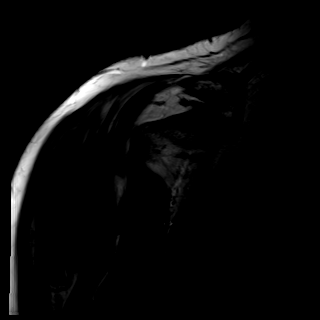
[im 6/21]
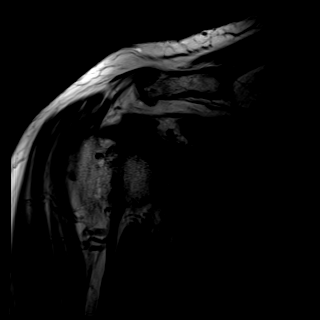
[im 9/21]
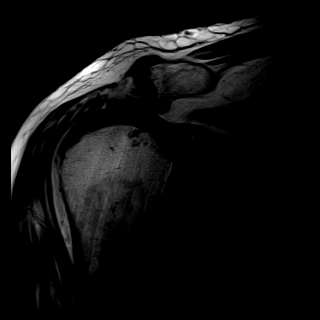
[im 12/21]
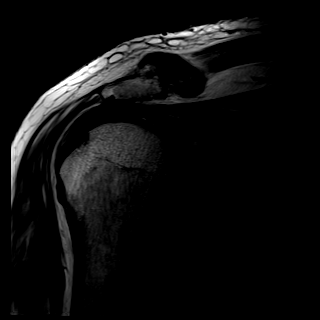
[im 15/21]
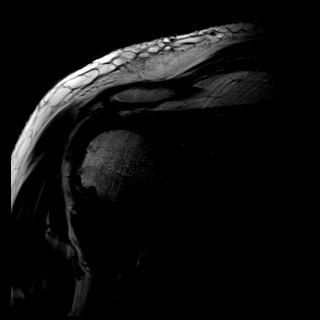
[im 18/21]
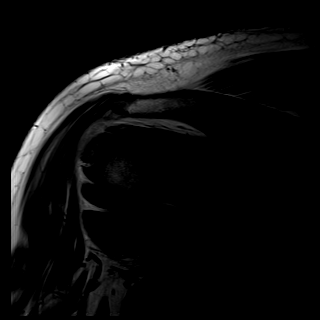
[im 21/21]
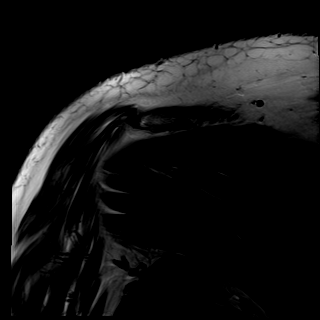

[Series 10: T2 fat-sat · oblique · right · 3.0mm · 0.27mm/px · 8 of 21 slices shown (3 of 3)]
[im 1/21]
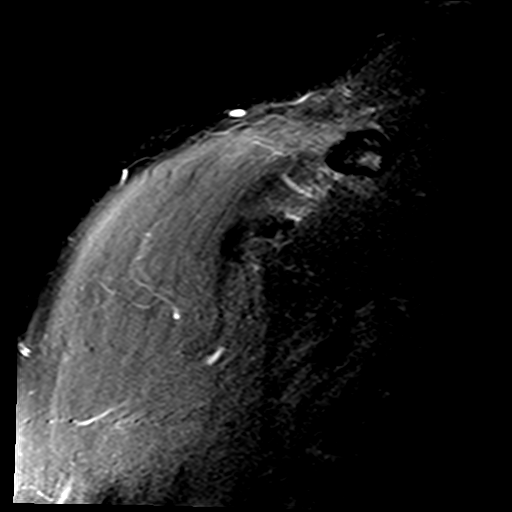
[im 3/21]
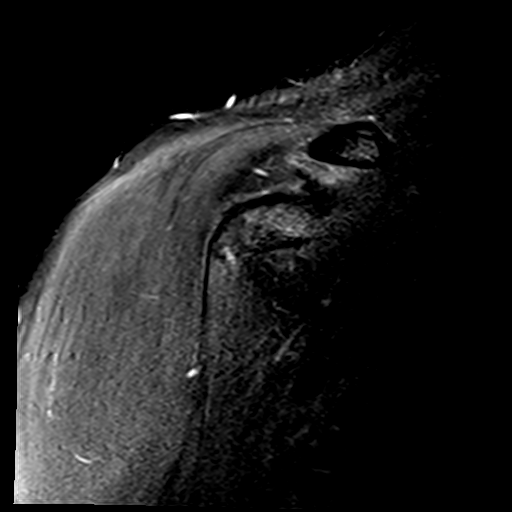
[im 6/21]
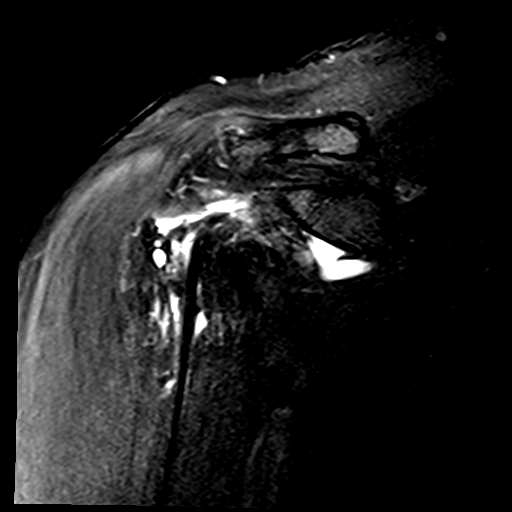
[im 9/21]
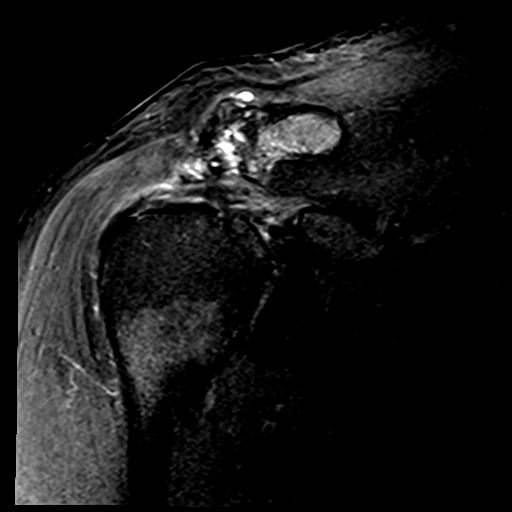
[im 12/21]
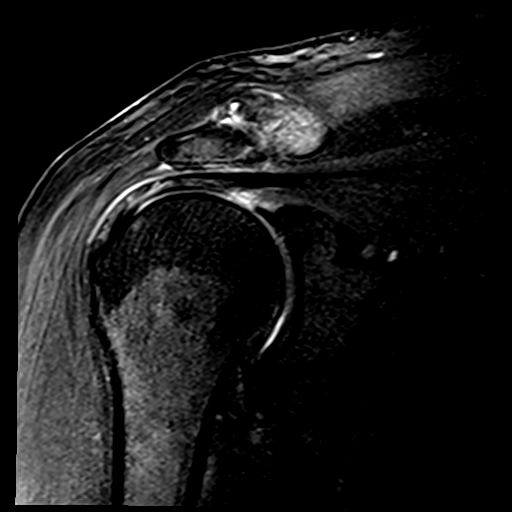
[im 15/21]
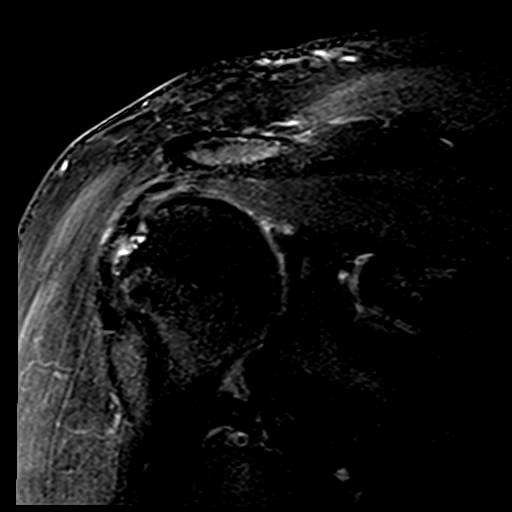
[im 18/21]
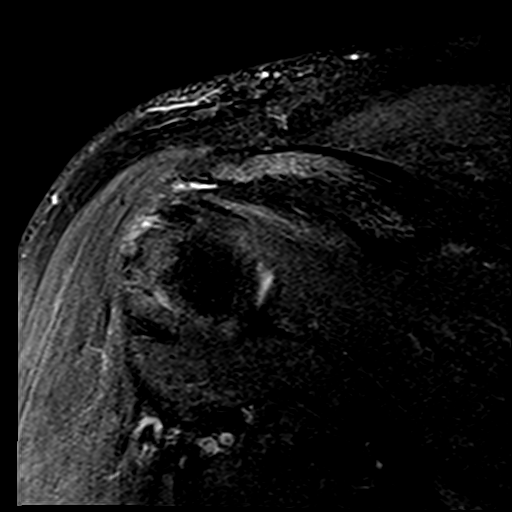
[im 21/21]
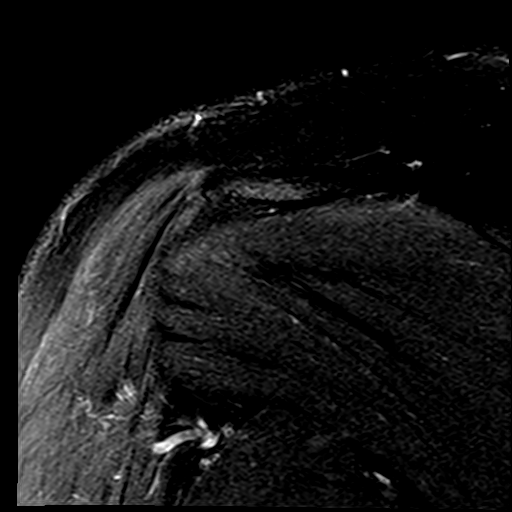

[37 of 40 positions shown; findings below may reference images not displayed]

FINDINGS: Rotator cuff: Rotator cuff tendinopathy is most notable in the
supraspinatus. No tear is identified.

Muscles: No focal atrophy or lesion. All imaged musculature
demonstrates mild atrophy most consistent with disuse.

Biceps long head: Tendinopathy of the intra-articular segment
without tear is identified.

Acromioclavicular Joint: Marked degenerative change is seen. Type 2
acromion. Small volume of subacromial/subdeltoid fluid noted.

Glenohumeral Joint: Unremarkable.

Labrum: The superior labrum is severely degenerated. No focal tear
is identified.

Bones:  No fracture or worrisome lesion.

Other: None.
IMPRESSION: Rotator cuff and long head of biceps tendinopathy without tear.

Marked acromioclavicular osteoarthritis.

Severely degenerated superior labrum.

Small volume of subacromial/subdeltoid fluid compatible with
bursitis.

## 2018-11-14 DIAGNOSIS — N342 Other urethritis: Secondary | ICD-10-CM | POA: Diagnosis not present

## 2018-11-14 DIAGNOSIS — E6609 Other obesity due to excess calories: Secondary | ICD-10-CM | POA: Diagnosis not present

## 2018-11-14 DIAGNOSIS — Z6836 Body mass index (BMI) 36.0-36.9, adult: Secondary | ICD-10-CM | POA: Diagnosis not present

## 2018-11-14 DIAGNOSIS — E538 Deficiency of other specified B group vitamins: Secondary | ICD-10-CM | POA: Diagnosis not present

## 2018-12-05 DIAGNOSIS — M25572 Pain in left ankle and joints of left foot: Secondary | ICD-10-CM | POA: Diagnosis not present

## 2018-12-05 DIAGNOSIS — M65872 Other synovitis and tenosynovitis, left ankle and foot: Secondary | ICD-10-CM | POA: Diagnosis not present

## 2018-12-19 DIAGNOSIS — M25572 Pain in left ankle and joints of left foot: Secondary | ICD-10-CM | POA: Diagnosis not present

## 2018-12-19 DIAGNOSIS — M65872 Other synovitis and tenosynovitis, left ankle and foot: Secondary | ICD-10-CM | POA: Diagnosis not present

## 2018-12-20 DIAGNOSIS — E538 Deficiency of other specified B group vitamins: Secondary | ICD-10-CM | POA: Diagnosis not present

## 2019-01-02 DIAGNOSIS — M25572 Pain in left ankle and joints of left foot: Secondary | ICD-10-CM | POA: Diagnosis not present

## 2019-01-02 DIAGNOSIS — M65872 Other synovitis and tenosynovitis, left ankle and foot: Secondary | ICD-10-CM | POA: Diagnosis not present

## 2019-01-03 DIAGNOSIS — Z0001 Encounter for general adult medical examination with abnormal findings: Secondary | ICD-10-CM | POA: Diagnosis not present

## 2019-01-03 DIAGNOSIS — Z681 Body mass index (BMI) 19 or less, adult: Secondary | ICD-10-CM | POA: Diagnosis not present

## 2019-01-03 DIAGNOSIS — E785 Hyperlipidemia, unspecified: Secondary | ICD-10-CM | POA: Diagnosis not present

## 2019-01-03 DIAGNOSIS — E1165 Type 2 diabetes mellitus with hyperglycemia: Secondary | ICD-10-CM | POA: Diagnosis not present

## 2019-01-03 DIAGNOSIS — I10 Essential (primary) hypertension: Secondary | ICD-10-CM | POA: Diagnosis not present

## 2019-01-03 DIAGNOSIS — Z1389 Encounter for screening for other disorder: Secondary | ICD-10-CM | POA: Diagnosis not present

## 2019-01-12 DIAGNOSIS — H5200 Hypermetropia, unspecified eye: Secondary | ICD-10-CM | POA: Diagnosis not present

## 2019-01-12 DIAGNOSIS — Z0100 Encounter for examination of eyes and vision without abnormal findings: Secondary | ICD-10-CM | POA: Diagnosis not present

## 2019-01-23 DIAGNOSIS — D6949 Other primary thrombocytopenia: Secondary | ICD-10-CM | POA: Diagnosis not present

## 2019-01-24 DIAGNOSIS — M25572 Pain in left ankle and joints of left foot: Secondary | ICD-10-CM | POA: Diagnosis not present

## 2019-02-22 DIAGNOSIS — E6609 Other obesity due to excess calories: Secondary | ICD-10-CM | POA: Diagnosis not present

## 2019-02-22 DIAGNOSIS — D6949 Other primary thrombocytopenia: Secondary | ICD-10-CM | POA: Diagnosis not present

## 2019-02-22 DIAGNOSIS — R945 Abnormal results of liver function studies: Secondary | ICD-10-CM | POA: Diagnosis not present

## 2019-02-22 DIAGNOSIS — M1990 Unspecified osteoarthritis, unspecified site: Secondary | ICD-10-CM | POA: Diagnosis not present

## 2019-02-22 DIAGNOSIS — E785 Hyperlipidemia, unspecified: Secondary | ICD-10-CM | POA: Diagnosis not present

## 2019-02-22 DIAGNOSIS — E119 Type 2 diabetes mellitus without complications: Secondary | ICD-10-CM | POA: Diagnosis not present

## 2019-02-22 DIAGNOSIS — E1165 Type 2 diabetes mellitus with hyperglycemia: Secondary | ICD-10-CM | POA: Diagnosis not present

## 2019-02-22 DIAGNOSIS — I10 Essential (primary) hypertension: Secondary | ICD-10-CM | POA: Diagnosis not present

## 2019-02-22 DIAGNOSIS — E039 Hypothyroidism, unspecified: Secondary | ICD-10-CM | POA: Diagnosis not present

## 2019-02-22 DIAGNOSIS — Z6835 Body mass index (BMI) 35.0-35.9, adult: Secondary | ICD-10-CM | POA: Diagnosis not present

## 2019-02-22 DIAGNOSIS — E538 Deficiency of other specified B group vitamins: Secondary | ICD-10-CM | POA: Diagnosis not present

## 2019-03-07 DIAGNOSIS — E1165 Type 2 diabetes mellitus with hyperglycemia: Secondary | ICD-10-CM | POA: Diagnosis not present

## 2019-03-23 DIAGNOSIS — E538 Deficiency of other specified B group vitamins: Secondary | ICD-10-CM | POA: Diagnosis not present

## 2019-04-23 DIAGNOSIS — E538 Deficiency of other specified B group vitamins: Secondary | ICD-10-CM | POA: Diagnosis not present

## 2019-04-30 ENCOUNTER — Ambulatory Visit (INDEPENDENT_AMBULATORY_CARE_PROVIDER_SITE_OTHER): Payer: Medicare HMO | Admitting: Physician Assistant

## 2019-04-30 ENCOUNTER — Encounter: Payer: Self-pay | Admitting: Physician Assistant

## 2019-04-30 ENCOUNTER — Ambulatory Visit (INDEPENDENT_AMBULATORY_CARE_PROVIDER_SITE_OTHER): Payer: Medicare HMO

## 2019-04-30 VITALS — Ht 73.0 in | Wt 272.0 lb

## 2019-04-30 DIAGNOSIS — M25511 Pain in right shoulder: Secondary | ICD-10-CM | POA: Diagnosis not present

## 2019-04-30 DIAGNOSIS — M542 Cervicalgia: Secondary | ICD-10-CM

## 2019-04-30 DIAGNOSIS — E782 Mixed hyperlipidemia: Secondary | ICD-10-CM | POA: Diagnosis not present

## 2019-04-30 DIAGNOSIS — I10 Essential (primary) hypertension: Secondary | ICD-10-CM | POA: Diagnosis not present

## 2019-04-30 DIAGNOSIS — E119 Type 2 diabetes mellitus without complications: Secondary | ICD-10-CM | POA: Diagnosis not present

## 2019-04-30 MED ORDER — CYCLOBENZAPRINE HCL 5 MG PO TABS: 5.0000 mg | ORAL_TABLET | Freq: Every day | ORAL | 1 refills | Status: DC

## 2019-04-30 NOTE — Progress Notes (Signed)
Office Visit Note   Patient: Jonathan Grimes           Date of Birth: 1956-01-20           MRN: [id number] Visit Date: 04/30/2019              Requested by: Sharilyn Sites, [address],  [place] [address] PCP: Sharilyn Sites, MD   Assessment & Plan: Visit Diagnoses:   1. Neck pain   2. Acute pain of right shoulder     Plan: Offered formal physical therapy defers.  Is diabetic therefore would not place him on a Medrol Dosepak.  Given Flexeril to take mainly at night.  He is given neck exercise sheets which she will perform on his own.  See him about 2 weeks see what type of response he had to this if he continues to have neck pain and radicular symptoms down both arms and will obtain an MRI of his cervical spine rule out HNP as source of radicular symptoms.  Questions were encouraged and answered at length.  Follow-Up Instructions: Return in about 2 weeks (around 05/14/2019).   Orders:  Orders Placed This Encounter  Procedures  . XR Cervical Spine 2 or 3 views  . XR Shoulder Right   Meds ordered this encounter  Medications  . cyclobenzaprine (FLEXERIL) 5 MG tablet    Sig: Take 1 tablet (5 mg total) by mouth at bedtime.    Dispense:  40 tablet    Refill:  1      Procedures: No procedures performed   Clinical Data: No additional findings.   Subjective: Chief Complaint  Patient presents with  . Neck - Pain  . Right Shoulder - Pain    08/26/2016 Right Shoulder RCR    HPI Jonathan Grimes 63 year old male well-known to Dr. Ninfa Linden service comes in today with new complaint of right shoulder pain and numbness tingling down both arms right neck pain.  Is been ongoing for the past 3 weeks.  Is gotten worse.  He has had no known injury.  He has a history of right shoulder rotator cuff repair 08/26/2016. No decrease in range of motion of either shoulder. Review of Systems Please see HPI otherwise negative territory.  Objective: Vital Signs: Ht 6\' 1"  (1.854  m)   Wt 272 lb (123.4 kg)   BMI 35.89 kg/m   Physical Exam Constitutional:      Appearance: He is not ill-appearing or diaphoretic.  Cardiovascular:     Pulses: Normal pulses.  Pulmonary:     Effort: Pulmonary effort is normal.  Neurological:     Mental Status: He is alert and oriented to person, place, and time.     Ortho Exam Bilateral shoulders 5-5 strength with external and internal rotation against resistance empty can test is negative bilaterally.  Impingement testing negative bilaterally.  Cervical spine he has discomfort with rotation to the right.  Positive Spurling's.  Tenderness medial border of the right scapula.  Nontender over the cervical spinal column.  Deep tendon reflexes are 2+ and equal and symmetric at the biceps triceps and brachial radialis.  Sensation grossly intact bilateral hands.  Negative Tinel's at the wrist bilaterally. Specialty Comments:  No specialty comments available.  Imaging: Xr Cervical Spine 2 Or 3 Views  Result Date: 04/30/2019 Cervical spine 3 views: Loss of lordotic curvature.  Degenerative changes throughout.  Grade 1 spondylolisthesis C4 on C5.  No acute fractures.  Xr Shoulder Right  Result Date: 04/30/2019  Right shoulder 3 views: Shoulders well located.  Moderate to severe degenerative changes AC joint.  Subacromial space well maintained.  No acute fractures.    PMFS History: Patient Active Problem List   Diagnosis Date Noted  . Status post right rotator cuff repair 09/01/2016  . Balanitis 12/26/2015  . Acquired phimosis of penis 12/26/2015  . Gastric erosion   . IDA (iron deficiency anemia)   . Heme positive stool   . B12 deficiency 11/04/2015  . Iron deficiency anemia   . Mucosal abnormality of stomach   . 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 Status post revision of total replacement of right knee 07/11/2015  . Arthritis of right hip 02/22/2014  . Status post THR (total hip replacement) 02/22/2014  .  Degenerative arthritis of hip 07/28/2012  . AVN (avascular necrosis of bone) ([place]) 06/13/2012  . Skin lesion of right lower limb 09/28/2011  . Follow-up examination after orthopedic surgery 06/14/2011  . GERD 01/01/2010  . HEMATOCHEZIA 01/01/2010  . DYSPHAGIA UNSPECIFIED 01/01/2010   Past Medical History:  Diagnosis Date  . Arthritis    SEVERE OA LEFT HIP WITH PAIN  . Diabetes mellitus without complication ([place])    type 2  . Elevated cholesterol   . Fatty liver   . GERD (gastroesophageal reflux disease)   . H/O hiatal hernia   . Hiatal hernia   . Hypertension   . Schatzki's ring   . Sleep apnea    pt not officially diagnosed, but pt and family state he has it  . Tubular adenoma     No family history on file.  Past Surgical History:  Procedure Laterality Date  . BIOPSY N/A 10/02/2015   Procedure: BIOPSY;  Surgeon: Daneil Dolin, MD;  [facility];  Service: Endoscopy;  Laterality: N/A;  Gastric bx  . COLONOSCOPY  01/19/10   Dr.Rourk- friable anorectal. o/w normal rectum, L sided diverticula, sigmoid polyp bx= tubular adenoma,  . COLONOSCOPY N/A 10/02/2015   [id number] appearing rectal/scattered sigmoid diverticula, next TCS five years for h/o colon adenomas  . ESOPHAGOGASTRODUODENOSCOPY  01/19/10   Dr.Rourk- noncritical schatzki's ring, o/w normal esophagus, small hiatal hernia o/w normal stomach D1, D2  . ESOPHAGOGASTRODUODENOSCOPY N/A 10/02/2015   [id number] erosiona s/p bx (no h.pylri, gastric mucosa with intestinal metaplasia)  . GIVENS CAPSULE STUDY N/A 11/21/2015   Procedure: GIVENS CAPSULE STUDY;  Surgeon: Daneil Dolin, MD;  [facility];  Service: Endoscopy;  Laterality: N/A;  0700  . JOINT REPLACEMENT  2012   RIGHT TOTAL KNEE ARTHROPLASTY  . TOTAL HIP ARTHROPLASTY  07/28/2012   Procedure: TOTAL HIP ARTHROPLASTY ANTERIOR APPROACH;  Surgeon: Mcarthur Rossetti, MD;  Location: [facility];  Service: Orthopedics;  Laterality: Left;  . TOTAL HIP  ARTHROPLASTY Right 02/22/2014   Procedure: RIGHT TOTAL HIP ARTHROPLASTY ANTERIOR APPROACH;  Surgeon: Mcarthur Rossetti, MD;  Location: [facility];  Service: Orthopedics;  Laterality: Right;  . TOTAL KNEE REVISION Right 07/11/2015   Procedure: REVISION RIGHT TOTAL KNEE ARTHROPLASTYone component only;  Surgeon: Mcarthur Rossetti, MD;  Location: [facility];  Service: Orthopedics;  Laterality: Right;   Social History   Occupational History  . Not on file  Tobacco Use  . Smoking status: Former Smoker    Types: Cigarettes    Quit date: 01/06/1982    Years since quitting: 37.3  . Smokeless tobacco: Current User    Types: Chew  . Tobacco comment: QUIT SMOKING [place]  CHEWING TOBACCO JAN 2012  Substance and Sexual Activity  . Alcohol use: Yes    Comment: beer occasionally  . Drug use: No  . Sexual activity: Not on file

## 2019-05-14 ENCOUNTER — Other Ambulatory Visit: Payer: Self-pay

## 2019-05-14 ENCOUNTER — Ambulatory Visit (INDEPENDENT_AMBULATORY_CARE_PROVIDER_SITE_OTHER): Payer: Medicare HMO | Admitting: Orthopaedic Surgery

## 2019-05-14 DIAGNOSIS — M542 Cervicalgia: Secondary | ICD-10-CM

## 2019-05-14 DIAGNOSIS — M25511 Pain in right shoulder: Secondary | ICD-10-CM

## 2019-05-14 MED ORDER — GABAPENTIN 300 MG PO CAPS: 300.0000 mg | ORAL_CAPSULE | Freq: Every day | ORAL | 0 refills | Status: DC

## 2019-05-14 MED ORDER — HYDROCODONE-ACETAMINOPHEN 5-325 MG PO TABS: 1.0000 | ORAL_TABLET | Freq: Four times a day (QID) | ORAL | 0 refills | Status: DC | PRN

## 2019-05-14 MED ORDER — HYDROCODONE-ACETAMINOPHEN 5-325 MG PO TABS: 1.0000 | ORAL_TABLET | Freq: Two times a day (BID) | ORAL | 0 refills | Status: DC | PRN

## 2019-05-14 NOTE — Progress Notes (Signed)
Patient comes in today with continued right-sided neck pain with a burning sensation that goes into his shoulder and pain and weakness as well as numbness and tingling going down his right dominant arm.  We have tried conservative treatment but that has not worked.  He is a diabetic as well.  We worked on neck exercises and anti-inflammatories.  It is waking up at night and is getting worse.  On examination he does have positive Spurling sign to the right side.  Shoulder function is normal he does have pain going down and to his right wrist and hand.  There is slightly weak triceps on the right comparing the right and left but he is got good grip strength bilaterally.  There is subjective numbness in the back of his hand on the right side.  At this point we will send him for an MRI of his cervical spine to assess for nerve compression or herniated disc.  We will try just a short course of Neurontin 300 mg to take at night as well as some hydrocodone to take sparingly.  He is already taken a muscle relaxant and anti-inflammatories that have not helped.

## 2019-05-22 DIAGNOSIS — E538 Deficiency of other specified B group vitamins: Secondary | ICD-10-CM | POA: Diagnosis not present

## 2019-05-30 DIAGNOSIS — E119 Type 2 diabetes mellitus without complications: Secondary | ICD-10-CM | POA: Diagnosis not present

## 2019-05-30 DIAGNOSIS — E785 Hyperlipidemia, unspecified: Secondary | ICD-10-CM | POA: Diagnosis not present

## 2019-05-30 DIAGNOSIS — I10 Essential (primary) hypertension: Secondary | ICD-10-CM | POA: Diagnosis not present

## 2019-06-25 DIAGNOSIS — E538 Deficiency of other specified B group vitamins: Secondary | ICD-10-CM | POA: Diagnosis not present

## 2019-06-30 DIAGNOSIS — E1140 Type 2 diabetes mellitus with diabetic neuropathy, unspecified: Secondary | ICD-10-CM | POA: Diagnosis not present

## 2019-06-30 DIAGNOSIS — M1990 Unspecified osteoarthritis, unspecified site: Secondary | ICD-10-CM | POA: Diagnosis not present

## 2019-06-30 DIAGNOSIS — I10 Essential (primary) hypertension: Secondary | ICD-10-CM | POA: Diagnosis not present

## 2019-06-30 DIAGNOSIS — E1165 Type 2 diabetes mellitus with hyperglycemia: Secondary | ICD-10-CM | POA: Diagnosis not present

## 2019-07-23 DIAGNOSIS — E538 Deficiency of other specified B group vitamins: Secondary | ICD-10-CM | POA: Diagnosis not present

## 2019-08-27 DIAGNOSIS — E538 Deficiency of other specified B group vitamins: Secondary | ICD-10-CM | POA: Diagnosis not present

## 2019-09-27 DIAGNOSIS — E538 Deficiency of other specified B group vitamins: Secondary | ICD-10-CM | POA: Diagnosis not present

## 2019-10-26 DIAGNOSIS — E538 Deficiency of other specified B group vitamins: Secondary | ICD-10-CM | POA: Diagnosis not present

## 2019-11-05 DIAGNOSIS — Z23 Encounter for immunization: Secondary | ICD-10-CM | POA: Diagnosis not present

## 2019-11-26 DIAGNOSIS — E538 Deficiency of other specified B group vitamins: Secondary | ICD-10-CM | POA: Diagnosis not present

## 2019-12-26 ENCOUNTER — Other Ambulatory Visit: Payer: Self-pay

## 2019-12-26 ENCOUNTER — Encounter: Payer: Self-pay | Admitting: Orthopaedic Surgery

## 2019-12-26 ENCOUNTER — Ambulatory Visit: Payer: Medicare HMO | Admitting: Orthopaedic Surgery

## 2019-12-26 DIAGNOSIS — M7022 Olecranon bursitis, left elbow: Secondary | ICD-10-CM

## 2019-12-26 MED ORDER — HYDROCODONE-ACETAMINOPHEN 5-325 MG PO TABS: 1.0000 | ORAL_TABLET | Freq: Four times a day (QID) | ORAL | 0 refills | Status: DC | PRN

## 2019-12-26 NOTE — Progress Notes (Signed)
Office Visit Note   Patient: Jonathan Grimes           Date of Birth: 07-16-1956           MRN: [id number] Visit Date: 12/26/2019              Requested by: Sharilyn Sites, [address] [place],  [place] [address] PCP: Sharilyn Sites, MD   Assessment & Plan: Visit Diagnoses:  1. Olecranon bursitis, left elbow     Plan: I was able to aspirate fluid from his elbow.  It was consistent with inflammatory fluid.  It was scanned and showed no evidence of infection at all and this completely decompressed the area.  I did not place a steroid in the soft tissue.  I did place a compressive dressing.  He understands this has a high recurrence rate so we will see him back next week to reaspirated if I need to and at that point may place a steroid in this.  All question concerns were answered and addressed.  We will refer him to Dr. Amil Amen from a rheumatologic standpoint due to arthritis in multiple joints.  Follow-Up Instructions: Return in about 1 week (around 01/02/2020).   Orders:  No orders of the defined types were placed in this encounter.  No orders of the defined types were placed in this encounter.     Procedures: No procedures performed   Clinical Data: No additional findings.   Subjective: Chief Complaint  Patient presents with  . Left Elbow - Edema  The patient comes in today with left elbow swelling for about a month now.  He has had no known injury to this area.  He has had no redness either.  We have replaced both his hips and the knee in the past.  He does have significant issues with arthritis and a lot of his joints.  He has seen a rheumatologist in the past.  He actually would like a referral to Dr. Amil Amen from a rheumatologic perspective.  He has not injured his left elbow but does report swelling.  He denies any fever, chills, nausea, vomiting.  He has had no other acute changes in his medical status.  He is a diabetic.  HPI  Review of Systems He denies any  headache, chest pain, shortness of breath, fever, chills, nausea, vomiting  Objective: Vital Signs: There were no vitals taken for this visit.  Physical Exam He is alert and orient x3 and in no acute distress Ortho Exam Examination of his left elbow does show a large soft tissue collection over the olecranon consistent with olecranon bursitis.  There is no redness or evidence of infection.  His elbow is ligamentously stable with full range of motion. Specialty Comments:  No specialty comments available.  Imaging: No results found.   PMFS History: Patient Active Problem List   Diagnosis Date Noted  . Status post right rotator cuff repair 09/01/2016  . Balanitis 12/26/2015  . Acquired phimosis of penis 12/26/2015  . Gastric erosion   . IDA (iron deficiency anemia)   . Heme positive stool   . B12 deficiency 11/04/2015  . Iron deficiency anemia   . Mucosal abnormality of stomach   . 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 Status post revision of total replacement of right knee 07/11/2015  . Arthritis of right hip 02/22/2014  . Status post THR (total hip replacement) 02/22/2014  . Degenerative arthritis of hip 07/28/2012  . AVN (avascular necrosis of  bone) ([place]) 06/13/2012  . Skin lesion of right lower limb 09/28/2011  . Follow-up examination after orthopedic surgery 06/14/2011  . GERD 01/01/2010  . HEMATOCHEZIA 01/01/2010  . DYSPHAGIA UNSPECIFIED 01/01/2010   Past Medical History:  Diagnosis Date  . Arthritis    SEVERE OA LEFT HIP WITH PAIN  . Diabetes mellitus without complication ([place])    type 2  . Elevated cholesterol   . Fatty liver   . GERD (gastroesophageal reflux disease)   . H/O hiatal hernia   . Hiatal hernia   . Hypertension   . Schatzki's ring   . Sleep apnea    pt not officially diagnosed, but pt and family state he has it  . Tubular adenoma     History reviewed. No pertinent family history.  Past Surgical History:  Procedure  Laterality Date  . BIOPSY N/A 10/02/2015   Procedure: BIOPSY;  Surgeon: Daneil Dolin, MD;  [facility];  Service: Endoscopy;  Laterality: N/A;  Gastric bx  . COLONOSCOPY  01/19/10   Dr.Rourk- friable anorectal. o/w normal rectum, L sided diverticula, sigmoid polyp bx= tubular adenoma,  . COLONOSCOPY N/A 10/02/2015   [id number] appearing rectal/scattered sigmoid diverticula, next TCS five years for h/o colon adenomas  . ESOPHAGOGASTRODUODENOSCOPY  01/19/10   Dr.Rourk- noncritical schatzki's ring, o/w normal esophagus, small hiatal hernia o/w normal stomach D1, D2  . ESOPHAGOGASTRODUODENOSCOPY N/A 10/02/2015   [id number] erosiona s/p bx (no h.pylri, gastric mucosa with intestinal metaplasia)  . GIVENS CAPSULE STUDY N/A 11/21/2015   Procedure: GIVENS CAPSULE STUDY;  Surgeon: Daneil Dolin, MD;  [facility];  Service: Endoscopy;  Laterality: N/A;  0700  . JOINT REPLACEMENT  2012   RIGHT TOTAL KNEE ARTHROPLASTY  . TOTAL HIP ARTHROPLASTY  07/28/2012   Procedure: TOTAL HIP ARTHROPLASTY ANTERIOR APPROACH;  Surgeon: Mcarthur Rossetti, MD;  [facility];  Service: Orthopedics;  Laterality: Left;  . TOTAL HIP ARTHROPLASTY Right 02/22/2014   Procedure: RIGHT TOTAL HIP ARTHROPLASTY ANTERIOR APPROACH;  Surgeon: Mcarthur Rossetti, MD;  [facility];  Service: Orthopedics;  Laterality: Right;  . TOTAL KNEE REVISION Right 07/11/2015   Procedure: REVISION RIGHT TOTAL KNEE ARTHROPLASTYone component only;  Surgeon: Mcarthur Rossetti, MD;  [facility];  Service: Orthopedics;  Laterality: Right;   Social History   Occupational History  . Not on file  Tobacco Use  . Smoking status: Former Smoker    Types: Cigarettes    Quit date: 01/06/1982    Years since quitting: 37.9  . Smokeless tobacco: Current User    Types: Chew  . Tobacco comment: QUIT SMOKING [place] 2012  Substance and Sexual Activity  . Alcohol use: Yes    Comment: beer  occasionally  . Drug use: No  . Sexual activity: Not on file

## 2019-12-31 DIAGNOSIS — I10 Essential (primary) hypertension: Secondary | ICD-10-CM | POA: Diagnosis not present

## 2019-12-31 DIAGNOSIS — E785 Hyperlipidemia, unspecified: Secondary | ICD-10-CM | POA: Diagnosis not present

## 2019-12-31 DIAGNOSIS — Z6837 Body mass index (BMI) 37.0-37.9, adult: Secondary | ICD-10-CM | POA: Diagnosis not present

## 2019-12-31 DIAGNOSIS — Z0000 Encounter for general adult medical examination without abnormal findings: Secondary | ICD-10-CM | POA: Diagnosis not present

## 2019-12-31 DIAGNOSIS — E119 Type 2 diabetes mellitus without complications: Secondary | ICD-10-CM | POA: Diagnosis not present

## 2019-12-31 DIAGNOSIS — E538 Deficiency of other specified B group vitamins: Secondary | ICD-10-CM | POA: Diagnosis not present

## 2019-12-31 DIAGNOSIS — E7849 Other hyperlipidemia: Secondary | ICD-10-CM | POA: Diagnosis not present

## 2019-12-31 DIAGNOSIS — Z1389 Encounter for screening for other disorder: Secondary | ICD-10-CM | POA: Diagnosis not present

## 2020-01-02 ENCOUNTER — Ambulatory Visit: Payer: Medicare HMO | Admitting: Orthopaedic Surgery

## 2020-01-02 ENCOUNTER — Other Ambulatory Visit: Payer: Self-pay

## 2020-01-02 ENCOUNTER — Encounter: Payer: Self-pay | Admitting: Orthopaedic Surgery

## 2020-01-02 DIAGNOSIS — M7022 Olecranon bursitis, left elbow: Secondary | ICD-10-CM

## 2020-01-02 MED ORDER — METHYLPREDNISOLONE ACETATE 40 MG/ML IJ SUSP
40.0000 mg | INTRAMUSCULAR | Status: AC | PRN
  Administered 2020-01-02: 09:00:00 40 mg via INTRA_ARTICULAR

## 2020-01-02 MED ORDER — LIDOCAINE HCL 1 % IJ SOLN
1.0000 mL | INTRAMUSCULAR | Status: AC | PRN
  Administered 2020-01-02: 1 mL

## 2020-01-02 NOTE — Progress Notes (Signed)
   Procedure Note  Patient: Jonathan Grimes             Date of Birth: 1956-04-27           MRN: [id number]             Visit Date: 01/02/2020 HPI: Mr. Dommer returns today 1 week follow-up left elbow olecranon bursitis.  He states that the swelling came back after 1 day.  He has had no fevers chills.  His hemoglobin A1c is 5 [email or web address].  Physical exam: Left elbow positive for swelling and fluid accumulation consistent with olecranon bursitis.  No drainage and no signs of infection.  He has full range of motion of left elbow without pain.  Procedures: Visit Diagnoses:  1. Olecranon bursitis, left elbow     Medium Joint Inj: L olecranon bursa on 01/02/2020 8:59 AM Medications: 1 mL lidocaine 1 %; 40 mg methylPREDNISolone acetate 40 MG/ML Aspirate: 11 mL yellow Consent was given by the parent. Immediately prior to procedure a time out was called to verify the correct patient, procedure, equipment, support staff and site/side marked as required. Patient was prepped and draped in the usual sterile fashion.    Plan: He will follow up with [place] in 2 weeks to check the elbow again.  However if the fluid accumulation becomes quite large he can come back sooner.  Ace bandage was applied after aspiration of the elbow today and leave this on until this evening remove it.  Questions were encouraged and answered.

## 2020-01-07 ENCOUNTER — Telehealth: Payer: Self-pay | Admitting: *Deleted

## 2020-01-07 NOTE — Telephone Encounter (Signed)
Did we do labs?

## 2020-01-07 NOTE — Telephone Encounter (Signed)
Sounds like the most simple route to go would be for the patient to obtain the records from his previous rheumatologist and take them to Dr. Melissa Noon office. Otherwise, he would need to come in to sign a release, which we fax and wait for records (likely a delay), and then fax to [facility].

## 2020-01-07 NOTE — Telephone Encounter (Signed)
They need labs at [facility] office before can get him scheduled.

## 2020-01-07 NOTE — Telephone Encounter (Signed)
Jonathan Grimes said he just wanted to go to a different rheumatologist  So, we haven't done any recent labs on him

## 2020-01-07 NOTE — Telephone Encounter (Signed)
The patient was requesting this referral because they have been to a rheumatologist for many years now but I think that her rheumatologist is retiring her they wanted someone different and requested seeing Dr. Amil Amen.  This is due to her previous history of rheumatologic disease and multiple joint complaints.

## 2020-01-07 NOTE — Telephone Encounter (Signed)
Received call from [place] stating they are needing lab results(if he had any labs) and synovial fluid ananysis (note states this was abnormal). Once received they will be able to schedule pt appt.   I looked in labs and did not see where any labs were drawn or the synovial fluid.   If you have any record please fax to [phone] so pt can be called and scheduled.

## 2020-01-07 NOTE — Telephone Encounter (Signed)
We were trying to send him to Dr. Amil Amen and Dr. Amil Amen wants previous rheumatology labs and notes from other rheum doc. What do we do to get those records and send them to [facility]? Or would it be easier for him to get the records to [facility]? I need you opinion/help?

## 2020-01-07 NOTE — Telephone Encounter (Signed)
Patient states they actually faxed all records to Dr. Valora Piccolo office already, I told her to do so again

## 2020-01-08 ENCOUNTER — Telehealth: Payer: Self-pay | Admitting: Radiology

## 2020-01-08 NOTE — Telephone Encounter (Signed)
Spoke with patients wife, she stated she talked with Jonathan Grimes from Jonathan Grimes office stating they still needed lab reports from fluid recently taken off Jonathan Grimes's elbow. Advised that fluid was only aspirated we did not send fluid off to be tested. I informed Jonathan Grimes would call Jonathan Grimes and find out what they are needing.  Spoke with Jonathan Grimes, Jonathan Grimes wanted the lab results from the fluid that was drained from elbow due to his interpretation from faxed office visit notes.  I advised Jonathan Grimes that we do not have anything in lab results recent from that last 3 yrs. We only aspirated patients elbow, fluid was never sent off to be tested. She understood and would inform Jonathan Grimes & patient.

## 2020-01-16 ENCOUNTER — Encounter: Payer: Self-pay | Admitting: Orthopaedic Surgery

## 2020-01-16 ENCOUNTER — Ambulatory Visit: Payer: Medicare HMO | Admitting: Orthopaedic Surgery

## 2020-01-16 ENCOUNTER — Other Ambulatory Visit: Payer: Self-pay

## 2020-01-16 DIAGNOSIS — M7022 Olecranon bursitis, left elbow: Secondary | ICD-10-CM

## 2020-01-16 NOTE — Progress Notes (Signed)
HPI: [name] returns today follow-up of his left elbow olecranon bursitis.  He states overall that the fluid is gone.  He says soreness in the elbow at this point time.  No fever chills.  He has no complaints of decreased range of motion of the elbow.  Review of systems: Please see HPI otherwise negative or noncontributory.  Physical exam: Left elbow full extension full flexion.  No tenderness over the medial lateral epicondyle region.  Slight tenderness over the olecranon region.  There is no abnormal warmth erythema or signs of recurrent olecranon bursitis.  Impression: Left elbow olecranon bursitis  Plan: We will have him apply Voltaren gel up to 4 times daily over the elbow.  He is reminded to keep pressure off the elbow.  Follow-up with [place] as needed.  Questions encouraged and answered by Dr. Ninfa Linden and myself.

## 2020-01-29 DIAGNOSIS — E538 Deficiency of other specified B group vitamins: Secondary | ICD-10-CM | POA: Diagnosis not present

## 2020-02-04 ENCOUNTER — Other Ambulatory Visit: Payer: Self-pay

## 2020-02-04 ENCOUNTER — Encounter: Payer: Self-pay | Admitting: Physician Assistant

## 2020-02-04 ENCOUNTER — Ambulatory Visit: Payer: Medicare HMO | Admitting: Physician Assistant

## 2020-02-04 VITALS — Ht 73.0 in | Wt 272.0 lb

## 2020-02-04 DIAGNOSIS — S46311A Strain of muscle, fascia and tendon of triceps, right arm, initial encounter: Secondary | ICD-10-CM

## 2020-02-04 NOTE — Progress Notes (Signed)
Office Visit Note   Patient: Jonathan Grimes           Date of Birth: 11-Jul-1956           MRN: [id number] Visit Date: 02/04/2020              Requested by: Sharilyn Sites, [address],  [place] [address] PCP: Sharilyn Sites, MD   Assessment & Plan: Visit Diagnoses:  1. Strain of right triceps, initial encounter     Plan: Relative rest and time. This should resolve with time however if it persists beyond 2 months he will follow-up with [place].  Questions encouraged and answered.  Follow-Up Instructions: Return if symptoms worsen or fail to improve.   Orders:  No orders of the defined types were placed in this encounter.  No orders of the defined types were placed in this encounter.     Procedures: No procedures performed   Clinical Data: No additional findings.   Subjective: Chief Complaint  Patient presents with  . Left Elbow - Follow-up    HPI Jonathan Grimes comes in today due to left elbow pain.  He noticed bruise came up a couple days ago after pulling up to get in his truck.  Said no new injury otherwise.  He has been treated recently for olecranon bursitis. Review of Systems See HPI otherwise negative  Objective: Vital Signs: Ht 6\' 1"  (1.854 m)   Wt 272 lb (123.4 kg)   BMI 35.89 kg/m   Physical Exam Neurological:     Mental Status: He is alert and oriented to person, place, and time.  Psychiatric:        Mood and Affect: Mood normal.     Ortho Exam Left elbow with full range of motion actively.  He has tenderness over the triceps muscle belly and down in to the insertion.  No palpable defect.  He has 5 out of 5 strength with extension of the elbow against resistance.  Ecchymosis about the elbow medially and over the olecranon area.  Biceps nontender distal biceps intact.  No recurrence of the left olecranon bursitis.  Specialty Comments:  No specialty comments available.  Imaging: No results found.   PMFS History: Patient Active  Problem List   Diagnosis Date Noted  . Status post right rotator cuff repair 09/01/2016  . Balanitis 12/26/2015  . Acquired phimosis of penis 12/26/2015  . Gastric erosion   . IDA (iron deficiency anemia)   . Heme positive stool   . B12 deficiency 11/04/2015  . Iron deficiency anemia   . Mucosal abnormality of stomach   . 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 Status post revision of total replacement of right knee 07/11/2015  . Arthritis of right hip 02/22/2014  . Status post THR (total hip replacement) 02/22/2014  . Degenerative arthritis of hip 07/28/2012  . AVN (avascular necrosis of bone) ([place]) 06/13/2012  . Skin lesion of right lower limb 09/28/2011  . Follow-up examination after orthopedic surgery 06/14/2011  . GERD 01/01/2010  . HEMATOCHEZIA 01/01/2010  . DYSPHAGIA UNSPECIFIED 01/01/2010   Past Medical History:  Diagnosis Date  . Arthritis    SEVERE OA LEFT HIP WITH PAIN  . Diabetes mellitus without complication ([facility])    type 2  . Elevated cholesterol   . Fatty liver   . GERD (gastroesophageal reflux disease)   . H/O hiatal hernia   . Hiatal hernia   . Hypertension   . Schatzki's ring   .  Sleep apnea    pt not officially diagnosed, but pt and family state he has it  . Tubular adenoma     History reviewed. No pertinent family history.  Past Surgical History:  Procedure Laterality Date  . BIOPSY N/A 10/02/2015   Procedure: BIOPSY;  Surgeon: Daneil Dolin, MD;  [facility];  Service: Endoscopy;  Laterality: N/A;  Gastric bx  . COLONOSCOPY  01/19/10   Dr.Rourk- friable anorectal. o/w normal rectum, L sided diverticula, sigmoid polyp bx= tubular adenoma,  . COLONOSCOPY N/A 10/02/2015   XKP:VVZSMO appearing rectal/scattered sigmoid diverticula, next TCS five years for h/o colon adenomas  . ESOPHAGOGASTRODUODENOSCOPY  01/19/10   Dr.Rourk- noncritical schatzki's ring, o/w normal esophagus, small hiatal hernia o/w normal stomach D1, D2  .  ESOPHAGOGASTRODUODENOSCOPY N/A 10/02/2015   LMB:EMLJQG erosiona s/p bx (no h.pylri, gastric mucosa with intestinal metaplasia)  . GIVENS CAPSULE STUDY N/A 11/21/2015   Procedure: GIVENS CAPSULE STUDY;  Surgeon: Daneil Dolin, MD;  [facility];  Service: Endoscopy;  Laterality: N/A;  0700  . JOINT REPLACEMENT  2012   RIGHT TOTAL KNEE ARTHROPLASTY  . TOTAL HIP ARTHROPLASTY  07/28/2012   Procedure: TOTAL HIP ARTHROPLASTY ANTERIOR APPROACH;  Surgeon: Mcarthur Rossetti, MD;  Location: [facility];  Service: Orthopedics;  Laterality: Left;  . TOTAL HIP ARTHROPLASTY Right 02/22/2014   Procedure: RIGHT TOTAL HIP ARTHROPLASTY ANTERIOR APPROACH;  Surgeon: Mcarthur Rossetti, MD;  [facility];  Service: Orthopedics;  Laterality: Right;  . TOTAL KNEE REVISION Right 07/11/2015   Procedure: REVISION RIGHT TOTAL KNEE ARTHROPLASTYone component only;  Surgeon: Mcarthur Rossetti, MD;  [facility];  Service: Orthopedics;  Laterality: Right;   Social History   Occupational History  . Not on file  Tobacco Use  . Smoking status: Former Smoker    Types: Cigarettes    Quit date: 01/06/1982    Years since quitting: 38.1  . Smokeless tobacco: Current User    Types: Chew  . Tobacco comment: QUIT SMOKING [place] 2012  Substance and Sexual Activity  . Alcohol use: Yes    Comment: beer occasionally  . Drug use: No  . Sexual activity: Not on file

## 2020-02-26 DIAGNOSIS — E538 Deficiency of other specified B group vitamins: Secondary | ICD-10-CM | POA: Diagnosis not present

## 2020-03-18 DIAGNOSIS — M2550 Pain in unspecified joint: Secondary | ICD-10-CM | POA: Diagnosis not present

## 2020-03-18 DIAGNOSIS — Z6836 Body mass index (BMI) 36.0-36.9, adult: Secondary | ICD-10-CM | POA: Diagnosis not present

## 2020-03-18 DIAGNOSIS — E669 Obesity, unspecified: Secondary | ICD-10-CM | POA: Diagnosis not present

## 2020-03-18 DIAGNOSIS — R768 Other specified abnormal immunological findings in serum: Secondary | ICD-10-CM | POA: Diagnosis not present

## 2020-03-18 DIAGNOSIS — R5383 Other fatigue: Secondary | ICD-10-CM | POA: Diagnosis not present

## 2020-03-18 DIAGNOSIS — M1A09X0 Idiopathic chronic gout, multiple sites, without tophus (tophi): Secondary | ICD-10-CM | POA: Diagnosis not present

## 2020-03-24 DIAGNOSIS — Z6836 Body mass index (BMI) 36.0-36.9, adult: Secondary | ICD-10-CM | POA: Diagnosis not present

## 2020-03-24 DIAGNOSIS — E669 Obesity, unspecified: Secondary | ICD-10-CM | POA: Diagnosis not present

## 2020-03-24 DIAGNOSIS — M2550 Pain in unspecified joint: Secondary | ICD-10-CM | POA: Diagnosis not present

## 2020-03-24 DIAGNOSIS — R7989 Other specified abnormal findings of blood chemistry: Secondary | ICD-10-CM | POA: Diagnosis not present

## 2020-03-24 DIAGNOSIS — R21 Rash and other nonspecific skin eruption: Secondary | ICD-10-CM | POA: Diagnosis not present

## 2020-03-24 DIAGNOSIS — R768 Other specified abnormal immunological findings in serum: Secondary | ICD-10-CM | POA: Diagnosis not present

## 2020-03-24 DIAGNOSIS — M1A09X0 Idiopathic chronic gout, multiple sites, without tophus (tophi): Secondary | ICD-10-CM | POA: Diagnosis not present

## 2020-03-26 DIAGNOSIS — E538 Deficiency of other specified B group vitamins: Secondary | ICD-10-CM | POA: Diagnosis not present

## 2020-04-30 DIAGNOSIS — E538 Deficiency of other specified B group vitamins: Secondary | ICD-10-CM | POA: Diagnosis not present

## 2020-05-08 DIAGNOSIS — L400 Psoriasis vulgaris: Secondary | ICD-10-CM | POA: Diagnosis not present

## 2020-05-26 DIAGNOSIS — E538 Deficiency of other specified B group vitamins: Secondary | ICD-10-CM | POA: Diagnosis not present

## 2020-06-09 DIAGNOSIS — R7989 Other specified abnormal findings of blood chemistry: Secondary | ICD-10-CM | POA: Diagnosis not present

## 2020-06-09 DIAGNOSIS — M2550 Pain in unspecified joint: Secondary | ICD-10-CM | POA: Diagnosis not present

## 2020-06-09 DIAGNOSIS — E669 Obesity, unspecified: Secondary | ICD-10-CM | POA: Diagnosis not present

## 2020-06-09 DIAGNOSIS — R21 Rash and other nonspecific skin eruption: Secondary | ICD-10-CM | POA: Diagnosis not present

## 2020-06-09 DIAGNOSIS — R768 Other specified abnormal immunological findings in serum: Secondary | ICD-10-CM | POA: Diagnosis not present

## 2020-06-09 DIAGNOSIS — Z6835 Body mass index (BMI) 35.0-35.9, adult: Secondary | ICD-10-CM | POA: Diagnosis not present

## 2020-06-09 DIAGNOSIS — M1A09X0 Idiopathic chronic gout, multiple sites, without tophus (tophi): Secondary | ICD-10-CM | POA: Diagnosis not present

## 2020-06-24 DIAGNOSIS — Z23 Encounter for immunization: Secondary | ICD-10-CM | POA: Diagnosis not present

## 2020-06-24 DIAGNOSIS — E538 Deficiency of other specified B group vitamins: Secondary | ICD-10-CM | POA: Diagnosis not present

## 2020-07-16 DIAGNOSIS — M7752 Other enthesopathy of left foot: Secondary | ICD-10-CM | POA: Diagnosis not present

## 2020-07-16 DIAGNOSIS — M21611 Bunion of right foot: Secondary | ICD-10-CM | POA: Diagnosis not present

## 2020-07-16 DIAGNOSIS — M21612 Bunion of left foot: Secondary | ICD-10-CM | POA: Diagnosis not present

## 2020-07-16 DIAGNOSIS — M7751 Other enthesopathy of right foot: Secondary | ICD-10-CM | POA: Diagnosis not present

## 2020-07-16 DIAGNOSIS — M25572 Pain in left ankle and joints of left foot: Secondary | ICD-10-CM | POA: Diagnosis not present

## 2020-07-28 DIAGNOSIS — E538 Deficiency of other specified B group vitamins: Secondary | ICD-10-CM | POA: Diagnosis not present

## 2020-07-31 DIAGNOSIS — M25572 Pain in left ankle and joints of left foot: Secondary | ICD-10-CM | POA: Diagnosis not present

## 2020-07-31 DIAGNOSIS — M7752 Other enthesopathy of left foot: Secondary | ICD-10-CM | POA: Diagnosis not present

## 2020-08-26 DIAGNOSIS — E538 Deficiency of other specified B group vitamins: Secondary | ICD-10-CM | POA: Diagnosis not present

## 2020-09-09 DIAGNOSIS — R7989 Other specified abnormal findings of blood chemistry: Secondary | ICD-10-CM | POA: Diagnosis not present

## 2020-09-09 DIAGNOSIS — M2550 Pain in unspecified joint: Secondary | ICD-10-CM | POA: Diagnosis not present

## 2020-09-09 DIAGNOSIS — R21 Rash and other nonspecific skin eruption: Secondary | ICD-10-CM | POA: Diagnosis not present

## 2020-09-09 DIAGNOSIS — M1A09X0 Idiopathic chronic gout, multiple sites, without tophus (tophi): Secondary | ICD-10-CM | POA: Diagnosis not present

## 2020-09-09 DIAGNOSIS — R768 Other specified abnormal immunological findings in serum: Secondary | ICD-10-CM | POA: Diagnosis not present

## 2020-09-09 DIAGNOSIS — E669 Obesity, unspecified: Secondary | ICD-10-CM | POA: Diagnosis not present

## 2020-09-09 DIAGNOSIS — Z6835 Body mass index (BMI) 35.0-35.9, adult: Secondary | ICD-10-CM | POA: Diagnosis not present

## 2020-09-25 DIAGNOSIS — E538 Deficiency of other specified B group vitamins: Secondary | ICD-10-CM | POA: Diagnosis not present

## 2020-10-01 ENCOUNTER — Encounter: Payer: Self-pay | Admitting: Internal Medicine

## 2020-10-24 DIAGNOSIS — E538 Deficiency of other specified B group vitamins: Secondary | ICD-10-CM | POA: Diagnosis not present

## 2020-11-26 DIAGNOSIS — E538 Deficiency of other specified B group vitamins: Secondary | ICD-10-CM | POA: Diagnosis not present

## 2020-12-09 DIAGNOSIS — R768 Other specified abnormal immunological findings in serum: Secondary | ICD-10-CM | POA: Diagnosis not present

## 2020-12-09 DIAGNOSIS — M1A09X0 Idiopathic chronic gout, multiple sites, without tophus (tophi): Secondary | ICD-10-CM | POA: Diagnosis not present

## 2020-12-09 DIAGNOSIS — R21 Rash and other nonspecific skin eruption: Secondary | ICD-10-CM | POA: Diagnosis not present

## 2020-12-09 DIAGNOSIS — Z6835 Body mass index (BMI) 35.0-35.9, adult: Secondary | ICD-10-CM | POA: Diagnosis not present

## 2020-12-09 DIAGNOSIS — E669 Obesity, unspecified: Secondary | ICD-10-CM | POA: Diagnosis not present

## 2020-12-09 DIAGNOSIS — R7989 Other specified abnormal findings of blood chemistry: Secondary | ICD-10-CM | POA: Diagnosis not present

## 2020-12-09 DIAGNOSIS — M2550 Pain in unspecified joint: Secondary | ICD-10-CM | POA: Diagnosis not present

## 2020-12-09 DIAGNOSIS — E119 Type 2 diabetes mellitus without complications: Secondary | ICD-10-CM | POA: Diagnosis not present

## 2020-12-24 DIAGNOSIS — E538 Deficiency of other specified B group vitamins: Secondary | ICD-10-CM | POA: Diagnosis not present

## 2021-01-27 DIAGNOSIS — E538 Deficiency of other specified B group vitamins: Secondary | ICD-10-CM | POA: Diagnosis not present

## 2021-02-23 DIAGNOSIS — E538 Deficiency of other specified B group vitamins: Secondary | ICD-10-CM | POA: Diagnosis not present

## 2021-03-09 ENCOUNTER — Ambulatory Visit (INDEPENDENT_AMBULATORY_CARE_PROVIDER_SITE_OTHER): Payer: Medicare HMO

## 2021-03-09 ENCOUNTER — Ambulatory Visit: Payer: Medicare HMO | Admitting: Physician Assistant

## 2021-03-09 ENCOUNTER — Encounter: Payer: Self-pay | Admitting: Physician Assistant

## 2021-03-09 DIAGNOSIS — M25512 Pain in left shoulder: Secondary | ICD-10-CM

## 2021-03-09 MED ORDER — METHYLPREDNISOLONE ACETATE 40 MG/ML IJ SUSP
40.0000 mg | INTRAMUSCULAR | Status: AC | PRN
  Administered 2021-03-09: 40 mg via INTRA_ARTICULAR

## 2021-03-09 MED ORDER — LIDOCAINE HCL 1 % IJ SOLN
3.0000 mL | INTRAMUSCULAR | Status: AC | PRN
  Administered 2021-03-09: 3 mL
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
## 2021-03-16 DIAGNOSIS — U071 COVID-19: Secondary | ICD-10-CM | POA: Diagnosis not present

## 2021-03-23 ENCOUNTER — Ambulatory Visit: Payer: Medicare HMO | Admitting: Orthopaedic Surgery

## 2021-03-31 DIAGNOSIS — E538 Deficiency of other specified B group vitamins: Secondary | ICD-10-CM | POA: Diagnosis not present

## 2021-04-28 DIAGNOSIS — E538 Deficiency of other specified B group vitamins: Secondary | ICD-10-CM | POA: Diagnosis not present

## 2021-05-28 DIAGNOSIS — E538 Deficiency of other specified B group vitamins: Secondary | ICD-10-CM | POA: Diagnosis not present

## 2021-05-28 DIAGNOSIS — Z23 Encounter for immunization: Secondary | ICD-10-CM | POA: Diagnosis not present

## 2021-06-10 DIAGNOSIS — R7989 Other specified abnormal findings of blood chemistry: Secondary | ICD-10-CM | POA: Diagnosis not present

## 2021-06-10 DIAGNOSIS — Z111 Encounter for screening for respiratory tuberculosis: Secondary | ICD-10-CM | POA: Diagnosis not present

## 2021-06-10 DIAGNOSIS — R768 Other specified abnormal immunological findings in serum: Secondary | ICD-10-CM | POA: Diagnosis not present

## 2021-06-10 DIAGNOSIS — Z6836 Body mass index (BMI) 36.0-36.9, adult: Secondary | ICD-10-CM | POA: Diagnosis not present

## 2021-06-10 DIAGNOSIS — M1A09X0 Idiopathic chronic gout, multiple sites, without tophus (tophi): Secondary | ICD-10-CM | POA: Diagnosis not present

## 2021-06-10 DIAGNOSIS — R21 Rash and other nonspecific skin eruption: Secondary | ICD-10-CM | POA: Diagnosis not present

## 2021-06-10 DIAGNOSIS — E669 Obesity, unspecified: Secondary | ICD-10-CM | POA: Diagnosis not present

## 2021-06-10 DIAGNOSIS — M150 Primary generalized (osteo)arthritis: Secondary | ICD-10-CM | POA: Diagnosis not present

## 2021-06-10 DIAGNOSIS — L4059 Other psoriatic arthropathy: Secondary | ICD-10-CM | POA: Diagnosis not present

## 2021-06-30 DIAGNOSIS — E538 Deficiency of other specified B group vitamins: Secondary | ICD-10-CM | POA: Diagnosis not present

## 2021-07-13 DIAGNOSIS — E119 Type 2 diabetes mellitus without complications: Secondary | ICD-10-CM | POA: Diagnosis not present

## 2021-07-13 DIAGNOSIS — D6949 Other primary thrombocytopenia: Secondary | ICD-10-CM | POA: Diagnosis not present

## 2021-07-13 DIAGNOSIS — E6609 Other obesity due to excess calories: Secondary | ICD-10-CM | POA: Diagnosis not present

## 2021-07-13 DIAGNOSIS — Z1331 Encounter for screening for depression: Secondary | ICD-10-CM | POA: Diagnosis not present

## 2021-07-13 DIAGNOSIS — E1140 Type 2 diabetes mellitus with diabetic neuropathy, unspecified: Secondary | ICD-10-CM | POA: Diagnosis not present

## 2021-07-13 DIAGNOSIS — E039 Hypothyroidism, unspecified: Secondary | ICD-10-CM | POA: Diagnosis not present

## 2021-07-13 DIAGNOSIS — I10 Essential (primary) hypertension: Secondary | ICD-10-CM | POA: Diagnosis not present

## 2021-07-13 DIAGNOSIS — M1991 Primary osteoarthritis, unspecified site: Secondary | ICD-10-CM | POA: Diagnosis not present

## 2021-07-13 DIAGNOSIS — E785 Hyperlipidemia, unspecified: Secondary | ICD-10-CM | POA: Diagnosis not present

## 2021-07-13 DIAGNOSIS — Z0000 Encounter for general adult medical examination without abnormal findings: Secondary | ICD-10-CM | POA: Diagnosis not present

## 2021-08-27 DIAGNOSIS — E538 Deficiency of other specified B group vitamins: Secondary | ICD-10-CM | POA: Diagnosis not present

## 2021-09-17 DIAGNOSIS — M1A09X0 Idiopathic chronic gout, multiple sites, without tophus (tophi): Secondary | ICD-10-CM | POA: Diagnosis not present

## 2021-09-17 DIAGNOSIS — E669 Obesity, unspecified: Secondary | ICD-10-CM | POA: Diagnosis not present

## 2021-09-17 DIAGNOSIS — L409 Psoriasis, unspecified: Secondary | ICD-10-CM | POA: Diagnosis not present

## 2021-09-17 DIAGNOSIS — R768 Other specified abnormal immunological findings in serum: Secondary | ICD-10-CM | POA: Diagnosis not present

## 2021-09-17 DIAGNOSIS — Z6835 Body mass index (BMI) 35.0-35.9, adult: Secondary | ICD-10-CM | POA: Diagnosis not present

## 2021-09-17 DIAGNOSIS — M150 Primary generalized (osteo)arthritis: Secondary | ICD-10-CM | POA: Diagnosis not present

## 2021-09-17 DIAGNOSIS — R7989 Other specified abnormal findings of blood chemistry: Secondary | ICD-10-CM | POA: Diagnosis not present

## 2021-09-17 DIAGNOSIS — L4059 Other psoriatic arthropathy: Secondary | ICD-10-CM | POA: Diagnosis not present

## 2021-09-23 DIAGNOSIS — E538 Deficiency of other specified B group vitamins: Secondary | ICD-10-CM | POA: Diagnosis not present

## 2021-09-23 DIAGNOSIS — D649 Anemia, unspecified: Secondary | ICD-10-CM | POA: Diagnosis not present

## 2021-09-23 DIAGNOSIS — M1A00X0 Idiopathic chronic gout, unspecified site, without tophus (tophi): Secondary | ICD-10-CM | POA: Diagnosis not present

## 2021-09-23 DIAGNOSIS — E669 Obesity, unspecified: Secondary | ICD-10-CM | POA: Diagnosis not present

## 2021-09-23 DIAGNOSIS — Z6835 Body mass index (BMI) 35.0-35.9, adult: Secondary | ICD-10-CM | POA: Diagnosis not present

## 2021-09-28 ENCOUNTER — Encounter: Payer: Self-pay | Admitting: Internal Medicine

## 2021-10-27 DIAGNOSIS — E538 Deficiency of other specified B group vitamins: Secondary | ICD-10-CM | POA: Diagnosis not present

## 2021-11-19 ENCOUNTER — Other Ambulatory Visit: Payer: Self-pay

## 2021-11-19 ENCOUNTER — Ambulatory Visit (INDEPENDENT_AMBULATORY_CARE_PROVIDER_SITE_OTHER): Payer: Self-pay | Admitting: *Deleted

## 2021-11-19 VITALS — Ht 73.0 in | Wt 270.0 lb

## 2021-11-19 DIAGNOSIS — Z86010 Personal history of colonic polyps: Secondary | ICD-10-CM

## 2021-11-19 NOTE — Progress Notes (Addendum)
Gastroenterology Pre-Procedure Review ? ?Request Date: 11/19/2021 ?Requesting Physician: Dr. Hilma Favors, Last TCS done 10/02/2015 by Dr. Gala Romney, colonic diverticulosis, 5 year recall, hx colonic adenoma ? ?PATIENT REVIEW QUESTIONS: The patient responded to the following health history questions as indicated:   ? ?1. Diabetes Melitis: yes, type II  ?2. Joint replacements in the past 12 months: no ?3. Major health problems in the past 3 months: no ?4. Has an artificial valve or MVP: no ?5. Has a defibrillator: no ?6. Has been advised in past to take antibiotics in advance of a procedure like teeth cleaning: no ?7. Family history of colon cancer: no  ?8. Alcohol Use: yes, 2 drinks a week ?9. Illicit drug Use: no ?10. History of sleep apnea: no  ?11. History of coronary artery or other vascular stents placed within the last 12 months: no ?12. History of any prior anesthesia complications: no ?13. Body mass index is 35.62 kg/m?. ?   ?MEDICATIONS & ALLERGIES:    ?Patient reports the following regarding taking any blood thinners:   ?Plavix? no ?Aspirin? Yes, 81 mg ?Coumadin? no ?Brilinta? no ?Xarelto? no ?Eliquis? no ?Pradaxa? no ?Savaysa? no ?Effient? no ? ?Patient confirms/reports the following medications:  ?Current Outpatient Medications  ?Medication Sig Dispense Refill  ? amLODipine (NORVASC) 2.5 MG tablet daily at 6 (six) AM.    ? aspirin EC 81 MG tablet Take 81 mg by mouth daily.    ? atorvastatin (LIPITOR) 10 MG tablet Take 10 mg by mouth every morning.     ? cyclobenzaprine (FLEXERIL) 5 MG tablet Take 1 tablet (5 mg total) by mouth at bedtime. 40 tablet 1  ? diclofenac (VOLTAREN) 75 MG EC tablet Take 75 mg by mouth 2 (two) times daily.    ? ENBREL SURECLICK 50 MG/ML injection once a week.    ? ferrous sulfate 325 (65 FE) MG tablet Take 325 mg by mouth 2 (two) times daily.    ? gabapentin (NEURONTIN) 300 MG capsule Take 1 capsule (300 mg total) by mouth at bedtime. 60 capsule 0  ? levothyroxine (SYNTHROID) 25 MCG tablet  daily at 6 (six) AM.    ? losartan (COZAAR) 100 MG tablet Take 100 mg by mouth daily.    ? MEGARED OMEGA-3 KRILL OIL PO Take 1 capsule by mouth daily.    ? metFORMIN (GLUCOPHAGE) 500 MG tablet Take 1,000 mg by mouth 2 (two) times daily with a meal.    ? omeprazole (PRILOSEC) 40 MG capsule Take 40 mg by mouth daily.    ? ?No current facility-administered medications for this visit.  ? ? ?Patient confirms/reports the following allergies:  ?No Known Allergies ? ?No orders of the defined types were placed in this encounter. ? ? ?AUTHORIZATION INFORMATION ?Primary Insurance: [place],  [place] #: [id number],  Group #: [id number] ?Pre-Cert / Josem Kaufmann required: Yes, approved online 01/04/2022-04/04/2022 ?Pre-Cert / Auth #: [id number] ? ?SCHEDULE INFORMATION: ?Procedure has been scheduled as follows:  ?Date: 01/04/2022, Time: 7:30 ?Location: [facility] with Dr. Gala Romney ? ?This Gastroenterology Pre-Precedure Review Form is being routed to the following provider(s): Roseanne Kaufman, NP ?  ?

## 2021-11-24 DIAGNOSIS — E538 Deficiency of other specified B group vitamins: Secondary | ICD-10-CM | POA: Diagnosis not present

## 2021-11-26 NOTE — Progress Notes (Signed)
ASA 2. No metformin day of procedure.  

## 2021-12-01 ENCOUNTER — Encounter: Payer: Self-pay | Admitting: *Deleted

## 2021-12-01 MED ORDER — PEG 3350-KCL-NA BICARB-NACL 420 G PO SOLR: 4000.0000 mL | Freq: Once | ORAL | 0 refills | Status: AC

## 2021-12-01 NOTE — Addendum Note (Signed)
Addended by: Metro Kung on: 12/01/2021 11:32 AM ? ? Modules accepted: Orders ? ?

## 2021-12-01 NOTE — Progress Notes (Addendum)
Spoke to pt.  Scheduled procedure for 01/04/2022 at 7:30, arrival 6:30 at [facility]  Reviewed prep instructions with pt by phone.  Pt made aware to hold Iron for 10 days prior to procedure (per discussion with Dr. Gala Romney).  Pt made aware that I sent RX for prep kit to his pharmacy.  Pt made aware to pick up OTC required items.  Pt is aware that I am mailing out prep instructions.  Confirmed mailing address. Pt also advised of diabetes medication adjustments.  ?

## 2021-12-02 ENCOUNTER — Other Ambulatory Visit: Payer: Self-pay | Admitting: *Deleted

## 2021-12-28 DIAGNOSIS — E538 Deficiency of other specified B group vitamins: Secondary | ICD-10-CM | POA: Diagnosis not present

## 2022-01-04 ENCOUNTER — Ambulatory Visit (HOSPITAL_COMMUNITY)
Admission: RE | Admit: 2022-01-04 | Discharge: 2022-01-04 | Disposition: A | Discharge status: Home / Self Care | Payer: Medicare HMO | Attending: Internal Medicine | Admitting: Internal Medicine

## 2022-01-04 ENCOUNTER — Encounter (HOSPITAL_COMMUNITY): Payer: Self-pay | Admitting: Internal Medicine

## 2022-01-04 ENCOUNTER — Other Ambulatory Visit: Payer: Self-pay

## 2022-01-04 ENCOUNTER — Ambulatory Visit (HOSPITAL_BASED_OUTPATIENT_CLINIC_OR_DEPARTMENT_OTHER): Payer: Medicare HMO | Admitting: Anesthesiology

## 2022-01-04 ENCOUNTER — Ambulatory Visit (HOSPITAL_COMMUNITY): Payer: Medicare HMO | Admitting: Anesthesiology

## 2022-01-04 ENCOUNTER — Encounter (HOSPITAL_COMMUNITY)
Admission: RE | Disposition: A | Discharge status: Home / Self Care | Payer: Self-pay | Source: Home / Self Care | Attending: Internal Medicine

## 2022-01-04 DIAGNOSIS — Q438 Other specified congenital malformations of intestine: Secondary | ICD-10-CM | POA: Diagnosis not present

## 2022-01-04 DIAGNOSIS — G4730 Sleep apnea, unspecified: Secondary | ICD-10-CM | POA: Insufficient documentation

## 2022-01-04 DIAGNOSIS — K5730 Diverticulosis of large intestine without perforation or abscess without bleeding: Secondary | ICD-10-CM

## 2022-01-04 DIAGNOSIS — D759 Disease of blood and blood-forming organs, unspecified: Secondary | ICD-10-CM | POA: Insufficient documentation

## 2022-01-04 DIAGNOSIS — Z86010 Personal history of colonic polyps: Secondary | ICD-10-CM

## 2022-01-04 DIAGNOSIS — G709 Myoneural disorder, unspecified: Secondary | ICD-10-CM | POA: Insufficient documentation

## 2022-01-04 DIAGNOSIS — Z8711 Personal history of peptic ulcer disease: Secondary | ICD-10-CM | POA: Insufficient documentation

## 2022-01-04 DIAGNOSIS — E119 Type 2 diabetes mellitus without complications: Secondary | ICD-10-CM | POA: Diagnosis not present

## 2022-01-04 DIAGNOSIS — I10 Essential (primary) hypertension: Secondary | ICD-10-CM | POA: Diagnosis not present

## 2022-01-04 DIAGNOSIS — D649 Anemia, unspecified: Secondary | ICD-10-CM | POA: Diagnosis not present

## 2022-01-04 DIAGNOSIS — Z87891 Personal history of nicotine dependence: Secondary | ICD-10-CM | POA: Diagnosis not present

## 2022-01-04 DIAGNOSIS — Z1211 Encounter for screening for malignant neoplasm of colon: Secondary | ICD-10-CM | POA: Diagnosis not present

## 2022-01-04 DIAGNOSIS — K449 Diaphragmatic hernia without obstruction or gangrene: Secondary | ICD-10-CM | POA: Diagnosis not present

## 2022-01-04 DIAGNOSIS — K219 Gastro-esophageal reflux disease without esophagitis: Secondary | ICD-10-CM | POA: Insufficient documentation

## 2022-01-04 HISTORY — PX: COLONOSCOPY WITH PROPOFOL: SHX5780

## 2022-01-04 LAB — GLUCOSE, CAPILLARY: Glucose-Capillary: 123 mg/dL — ABNORMAL HIGH (ref 70–99)

## 2022-01-04 SURGERY — COLONOSCOPY WITH PROPOFOL
Anesthesia: General

## 2022-01-04 MED ORDER — SIMETHICONE 40 MG/0.6ML PO SUSP
ORAL | Status: DC | PRN
  Administered 2022-01-04: 60 mL

## 2022-01-04 MED ORDER — PROPOFOL 500 MG/50ML IV EMUL
INTRAVENOUS | Status: DC | PRN
  Administered 2022-01-04: 150 ug/kg/min via INTRAVENOUS

## 2022-01-04 MED ORDER — PROPOFOL 1000 MG/100ML IV EMUL
INTRAVENOUS | Status: AC
  Filled 2022-01-04: qty 200

## 2022-01-04 MED ORDER — LACTATED RINGERS IV SOLN: INTRAVENOUS | Status: DC | PRN

## 2022-01-04 MED ORDER — PROPOFOL 10 MG/ML IV BOLUS
INTRAVENOUS | Status: DC | PRN
  Administered 2022-01-04: 50 mg via INTRAVENOUS
  Administered 2022-01-04: 75 mg via INTRAVENOUS
  Administered 2022-01-04: 25 mg via INTRAVENOUS

## 2022-01-04 MED ORDER — LIDOCAINE HCL 1 % IJ SOLN
INTRAMUSCULAR | Status: DC | PRN
  Administered 2022-01-04: 50 mg via INTRADERMAL

## 2022-01-04 MED ORDER — LIDOCAINE HCL (PF) 2 % IJ SOLN
INTRAMUSCULAR | Status: AC
  Filled 2022-01-04: qty 5

## 2022-01-04 NOTE — Op Note (Signed)
[facility] ?Patient Name: Jonathan Grimes ?Procedure Date: 01/04/2022 7:21 AM ?MRN: [id number] ?Date of Birth: [birth year] ?Attending MD: Norvel Richards , MD ?CSN: [id number] ?Age: 66 ?Admit Type: Outpatient ?Procedure:                Colonoscopy ?Indications:              High risk colon cancer surveillance: Personal  ?                          history of colonic polyps ?Providers:                Norvel Richards, MD, Crystal Page, Eugene Garnet  ?                          Shanon Brow, Technician ?Referring MD:              ?Medicines:                Propofol per Anesthesia ?Complications:            No immediate complications. ?Estimated Blood Loss:     Estimated blood loss: none. ?Procedure:                Pre-Anesthesia Assessment: ?                          - Prior to the procedure, a History and Physical  ?                          was performed, and patient medications and  ?                          allergies were reviewed. The patient's tolerance of  ?                          previous anesthesia was also reviewed. The risks  ?                          and benefits of the procedure and the sedation  ?                          options and risks were discussed with the patient.  ?                          All questions were answered, and informed consent  ?                          was obtained. Prior Anticoagulants: The patient has  ?                          taken no previous anticoagulant or antiplatelet  ?                          agents. ASA Grade Assessment: II - A patient with  ?  mild systemic disease. After reviewing the risks  ?                          and benefits, the patient was deemed in  ?                          satisfactory condition to undergo the procedure. ?                          After obtaining informed consent, the colonoscope  ?                          was passed under direct vision. Throughout the  ?                          procedure, the patient's blood  pressure, pulse, and  ?                          oxygen saturations were monitored continuously. The  ?                          [phone]) scope was introduced through the  ?                          anus and advanced to the the cecum, identified by  ?                          appendiceal orifice and ileocecal valve. The  ?                          colonoscopy was performed without difficulty. The  ?                          patient tolerated the procedure well. The quality  ?                          of the bowel preparation was adequate. The  ?                          ileocecal valve, appendiceal orifice, and rectum  ?                          were photographed. The entire colon was well  ?                          visualized. ?Scope In: 7:35:15 AM ?Scope Out: 7:50:46 AM ?Scope Withdrawal Time: 0 hours 8 minutes 8 seconds  ?Total Procedure Duration: 0 hours 15 minutes 31 seconds  ?Findings: ?     The perianal and digital rectal examinations were normal. Elongated  ?     redundant colon. Required external abdominal pressure to reach the cecum. ?     Scattered medium-mouthed diverticula were found in the sigmoid colon. ?     The exam was otherwise without abnormality on direct and retroflexion  ?  views. ?Impression:               - Diverticulosis in the sigmoid colon. Redundant  ?                          colon. ?                          - The examination was otherwise normal on direct  ?                          and retroflexion views. ?                          - No specimens collected. ?Moderate Sedation: ?     Moderate (conscious) sedation was personally administered by an  ?     anesthesia professional. The following parameters were monitored: oxygen  ?     saturation, heart rate, blood pressure, respiratory rate, EKG, adequacy  ?     of pulmonary ventilation, and response to care. ?Recommendation:           - Patient has a contact number available for  ?                          emergencies. The  signs and symptoms of potential  ?                          delayed complications were discussed with the  ?                          patient. Return to normal activities tomorrow.  ?                          Written discharge instructions were provided to the  ?                          patient. ?                          - Resume previous diet. ?                          - Continue present medications. ?                          - Repeat colonoscopy age 63 ?Procedure Code(s):        --- Professional --- ?                          [phone], Colonoscopy, flexible; diagnostic, including  ?                          collection of specimen(s) by brushing or washing,  ?                          when performed (separate procedure) ?Diagnosis Code(s):        --- Professional --- ?  Z86.010, Personal history of colonic polyps ?                          K57.30, Diverticulosis of large intestine without  ?                          perforation or abscess without bleeding ?CPT copyright 2019 American Medical Association. All rights reserved. ?The codes documented in this report are preliminary and upon coder review may  ?be revised to meet current compliance requirements. ?Cristopher Estimable. Cherry Turlington, MD ?Norvel Richards, MD ?01/04/2022 7:59:14 AM ?This report has been signed electronically. ?Number of Addenda: 0 ?

## 2022-01-04 NOTE — Anesthesia Postprocedure Evaluation (Signed)
Anesthesia Post Note ? ?Patient: Jonathan Grimes ? ?Procedure(s) Performed: COLONOSCOPY WITH PROPOFOL ? ?Patient location during evaluation: Endoscopy ?Anesthesia Type: General ?Level of consciousness: awake and alert ?Pain management: pain level controlled ?Vital Signs Assessment: post-procedure vital signs reviewed and stable ?Respiratory status: spontaneous breathing ?Cardiovascular status: blood pressure returned to baseline and stable ?Postop Assessment: no apparent nausea or vomiting ?Anesthetic complications: no ? ? ?No notable events documented. ? ? ?Last Vitals:  ?Vitals:  ? 01/04/22 0650 01/04/22 0757  ?BP: (!) 161/87 117/62  ?Pulse: 73 77  ?Resp: 18 (!) 22  ?Temp: 36.4 ?C 36.4 ?C  ?SpO2: 94% 93%  ?  ?Last Pain:  ?Vitals:  ? 01/04/22 0757  ?TempSrc: Oral  ?PainSc: 0-No pain  ? ? ?  ?  ?  ?  ?  ?  ? ?[name] ? ? ? ? ?

## 2022-01-04 NOTE — H&P (Signed)
?$'@LOGO'f$ @ ? ? ?Primary Care Physician:  Sharilyn Sites, MD ?Primary Gastroenterologist:  Dr. Gala Romney ? ?Pre-Procedure History & Physical: ?HPI:  Jonathan Grimes is a 66 y.o. male here for surveillance colonoscopy.  Distant history colonic adenoma negative colonoscopy 5 years ago.  No lower GI tract symptoms at this time. ? ?Past Medical History:  ?Diagnosis Date  ? Arthritis   ? SEVERE OA LEFT HIP WITH PAIN  ? Diabetes mellitus without complication ([place])   ? type 2  ? Elevated cholesterol   ? Fatty liver   ? GERD (gastroesophageal reflux disease)   ? H/O hiatal hernia   ? Hiatal hernia   ? Hypertension   ? Schatzki's ring   ? Sleep apnea   ? pt not officially diagnosed, but pt and family state he has it  ? Tubular adenoma   ? ? ?Past Surgical History:  ?Procedure Laterality Date  ? BIOPSY N/A 10/02/2015  ? Procedure: BIOPSY;  Surgeon: Daneil Dolin, MD;  Location: [facility];  Service: Endoscopy;  Laterality: N/A;  Gastric bx  ? COLONOSCOPY  01/19/10  ? Dr.Ashir Kunz- friable anorectal. o/w normal rectum, L sided diverticula, sigmoid polyp bx= tubular adenoma,  ? COLONOSCOPY N/A 10/02/2015  ? YDX:AJOINO appearing rectal/scattered sigmoid diverticula, next TCS five years for h/o colon adenomas  ? ESOPHAGOGASTRODUODENOSCOPY  01/19/10  ? Dr.Kael Keetch- noncritical schatzki's ring, o/w normal esophagus, small hiatal hernia o/w normal stomach D1, D2  ? ESOPHAGOGASTRODUODENOSCOPY N/A 10/02/2015  ? MVE:HMCNOB erosiona s/p bx (no h.pylri, gastric mucosa with intestinal metaplasia)  ? GIVENS CAPSULE STUDY N/A 11/21/2015  ? Procedure: GIVENS CAPSULE STUDY;  Surgeon: Daneil Dolin, MD;  Location: [facility];  Service: Endoscopy;  Laterality: N/A;  0700  ? JOINT REPLACEMENT  2012  ? RIGHT TOTAL KNEE ARTHROPLASTY  ? TOTAL HIP ARTHROPLASTY  07/28/2012  ? Procedure: TOTAL HIP ARTHROPLASTY ANTERIOR APPROACH;  Surgeon: Mcarthur Rossetti, MD;  Location: [facility];  Service: Orthopedics;  Laterality: Left;  ? TOTAL HIP ARTHROPLASTY Right 02/22/2014   ? Procedure: RIGHT TOTAL HIP ARTHROPLASTY ANTERIOR APPROACH;  Surgeon: Mcarthur Rossetti, MD;  Location: [facility];  Service: Orthopedics;  Laterality: Right;  ? TOTAL KNEE REVISION Right 07/11/2015  ? Procedure: REVISION RIGHT TOTAL KNEE ARTHROPLASTYone component only;  Surgeon: Mcarthur Rossetti, MD;  Location: [facility];  Service: Orthopedics;  Laterality: Right;  ? ? ?Prior to Admission medications   ?Medication Sig Start Date End Date Taking? Authorizing Provider  ?amLODipine (NORVASC) 2.5 MG tablet daily at 6 (six) AM. 01/18/19  Yes [provider]  ?aspirin EC 81 MG tablet Take 81 mg by mouth daily.   Yes [provider]  ?atorvastatin (LIPITOR) 10 MG tablet Take 10 mg by mouth every morning.    Yes [provider]  ?cyclobenzaprine (FLEXERIL) 5 MG tablet Take 1 tablet (5 mg total) by mouth at bedtime. 04/30/19  Yes Pete Pelt, PA-C  ?diclofenac (VOLTAREN) 75 MG EC tablet Take 75 mg by mouth 2 (two) times daily.   Yes [provider]  ?gabapentin (NEURONTIN) 300 MG capsule Take 1 capsule (300 mg total) by mouth at bedtime. 05/14/19  Yes Mcarthur Rossetti, MD  ?levothyroxine (SYNTHROID) 25 MCG tablet daily at 6 (six) AM. 02/06/19  Yes [provider]  ?losartan (COZAAR) 100 MG tablet Take 100 mg by mouth daily.   Yes [provider]  ?MEGARED OMEGA-3 KRILL OIL PO Take 1 capsule by mouth daily.   Yes [provider]  ?  metFORMIN (GLUCOPHAGE) 500 MG tablet Take 1,000 mg by mouth 2 (two) times daily with a meal.   Yes [provider]  ?omeprazole (PRILOSEC) 40 MG capsule Take 40 mg by mouth daily.   Yes [provider]  ?ENBREL SURECLICK 50 MG/ML injection once a week. 06/16/21   [provider]  ?ferrous sulfate 325 (65 FE) MG tablet Take 325 mg by mouth 2 (two) times daily.    [provider]  ? ? ?Allergies as of 12/01/2021  ? (No Known Allergies)  ? ? ?History reviewed. No pertinent family  history. ? ?Social History  ? ?Socioeconomic History  ? Marital status: Married  ?  Spouse name: Not on file  ? Number of children: Not on file  ? Years of education: Not on file  ? Highest education level: Not on file  ?Occupational History  ? Not on file  ?Tobacco Use  ? Smoking status: Former  ?  Types: Cigarettes  ?  Quit date: 01/06/1982  ?  Years since quitting: 40.0  ? Smokeless tobacco: Current  ?  Types: Chew  ? Tobacco comments:  ?  QUIT SMOKING [place] 2012  ?Substance and Sexual Activity  ? Alcohol use: Yes  ?  Comment: beer occasionally  ? Drug use: No  ? Sexual activity: Not on file  ?Other Topics Concern  ? Not on file  ?Social History Narrative  ? Not on file  ? ?Social Determinants of Health  ? ?Financial Resource Strain: Not on file  ?Food Insecurity: Not on file  ?Transportation Needs: Not on file  ?Physical Activity: Not on file  ?Stress: Not on file  ?Social Connections: Not on file  ?Intimate Partner Violence: Not on file  ? ? ?Review of Systems: ?See HPI, otherwise negative ROS ? ?Physical Exam: ?BP (!) 161/87   Pulse 73   Temp 97.6 ?F (36.4 ?C) (Oral)   Resp 18   SpO2 94%  ?General:   Alert,  Well-developed, well-nourished, pleasant and cooperative in NAD ?Neck:  Supple; no masses or thyromegaly. No significant cervical adenopathy. ?Lungs:  Clear throughout to auscultation.   No wheezes, crackles, or rhonchi. No acute distress. ?Heart:  Regular rate and rhythm; no murmurs, clicks, rubs,  or gallops. ?Abdomen: Non-distended, normal bowel sounds.  Soft and nontender without appreciable mass or hepatosplenomegaly.  ?Pulses:  Normal pulses noted. ?Extremities:  Without clubbing or edema. ? ?Impression/Plan: 66 year old gentleman history colonic adenoma; here for surveillance colonoscopy. ?The risks, benefits, limitations, alternatives and imponderables have been reviewed with the patient. Questions have been answered. All parties are agreeable.   ? ? ? ? ?Notice:  This dictation was prepared with Dragon dictation along with smaller phrase technology. Any transcriptional errors that result from this process are unintentional and may not be corrected upon review.  ?

## 2022-01-04 NOTE — Discharge Instructions (Addendum)
?  Colonoscopy ?Discharge Instructions ? ?Read the instructions outlined below and refer to this sheet in the next few weeks. These discharge instructions provide you with general information on caring for yourself after you leave the hospital. Your doctor may also give you specific instructions. While your treatment has been planned according to the most current medical practices available, unavoidable complications occasionally occur. If you have any problems or questions after discharge, call Dr. Gala Romney at [phone]. ?ACTIVITY ?You may resume your regular activity, but move at a slower pace for the next 24 hours.  ?Take frequent rest periods for the next 24 hours.  ?Walking will help get rid of the air and reduce the bloated feeling in your belly (abdomen).  ?No driving for 24 hours (because of the medicine (anesthesia) used during the test).   ?Do not sign any important legal documents or operate any machinery for 24 hours (because of the anesthesia used during the test).  ?NUTRITION ?Drink plenty of fluids.  ?You may resume your normal diet as instructed by your doctor.  ?Begin with a light meal and progress to your normal diet. Heavy or fried foods are harder to digest and may make you feel sick to your stomach (nauseated).  ?Avoid alcoholic beverages for 24 hours or as instructed.  ?MEDICATIONS ?You may resume your normal medications unless your doctor tells you otherwise.  ?WHAT YOU CAN EXPECT TODAY ?Some feelings of bloating in the abdomen.  ?Passage of more gas than usual.  ?Spotting of blood in your stool or on the toilet paper.  ?IF YOU HAD POLYPS REMOVED DURING THE COLONOSCOPY: ?No aspirin products for 7 days or as instructed.  ?No alcohol for 7 days or as instructed.  ?Eat a soft diet for the next 24 hours.  ?FINDING OUT THE RESULTS OF YOUR TEST ?Not all test results are available during your visit. If your test results are not back during the visit, make an appointment with your caregiver to find out the  results. Do not assume everything is normal if you have not heard from your caregiver or the medical facility. It is important for you to follow up on all of your test results.  ?SEEK IMMEDIATE MEDICAL ATTENTION IF: ?You have more than a spotting of blood in your stool.  ?Your belly is swollen (abdominal distention).  ?You are nauseated or vomiting.  ?You have a temperature over 101.  ?You have abdominal pain or discomfort that is severe or gets worse throughout the day.   ? ? ?No polyps found today ? ?Diverticulosis information provided ? ?I recommend 1 more colonoscopy at age 71 ? ?At patient request, I called Gedalia Mcmillon [phone] -no answer.  Left a message. ?

## 2022-01-04 NOTE — Anesthesia Preprocedure Evaluation (Signed)
Anesthesia Evaluation  ?Patient identified by MRN, date of birth, ID band ?Patient awake ? ? ? ?Reviewed: ?Allergy & Precautions, H&P , NPO status , Patient's Chart, lab work & pertinent test results, reviewed documented beta blocker date and time  ? ?Airway ?Mallampati: II ? ?TM Distance: >3 FB ?Neck ROM: full ? ? ? Dental ?no notable dental hx. ? ?  ?Pulmonary ?sleep apnea , former smoker,  ?  ?Pulmonary exam normal ?breath sounds clear to auscultation ? ? ? ? ? ? Cardiovascular ?Exercise Tolerance: Good ?hypertension, negative cardio ROS ? ? ?Rhythm:regular Rate:Normal ? ? ?  ?Neuro/Psych ? Neuromuscular disease negative psych ROS  ? GI/Hepatic ?Neg liver ROS, hiatal hernia, PUD, GERD  Medicated,  ?Endo/Other  ?negative endocrine ROSdiabetes ? Renal/GU ?negative Renal ROS  ?negative genitourinary ?  ?Musculoskeletal ? ? Abdominal ?  ?Peds ? Hematology ? ?(+) Blood dyscrasia, anemia ,   ?Anesthesia Other Findings ? ? Reproductive/Obstetrics ?negative OB ROS ? ?  ? ? ? ? ? ? ? ? ? ? ? ? ? ?  ?  ? ? ? ? ? ? ? ? ?Anesthesia Physical ?Anesthesia Plan ? ?ASA: 2 ? ?Anesthesia Plan: General  ? ?Post-op Pain Management:   ? ?Induction:  ? ?PONV Risk Score and Plan: Propofol infusion ? ?Airway Management Planned:  ? ?Additional Equipment:  ? ?Intra-op Plan:  ? ?Post-operative Plan:  ? ?Informed Consent: I have reviewed the patients History and Physical, chart, labs and discussed the procedure including the risks, benefits and alternatives for the proposed anesthesia with the patient or authorized representative who has indicated his/her understanding and acceptance.  ? ? ? ?Dental Advisory Given ? ?Plan Discussed with: CRNA ? ?Anesthesia Plan Comments:   ? ? ? ? ? ? ?Anesthesia Quick Evaluation ? ?

## 2022-01-04 NOTE — Transfer of Care (Signed)
Immediate Anesthesia Transfer of Care Note ? ?Patient: Jonathan Grimes ? ?Procedure(s) Performed: COLONOSCOPY WITH PROPOFOL ? ?Patient Location: Endoscopy Unit ? ?Anesthesia Type:General ? ?Level of Consciousness: awake ? ?Airway & Oxygen Therapy: Patient Spontanous Breathing ? ?Post-op Assessment: Report given to RN ? ?Post vital signs: Reviewed ? ?Last Vitals:  ?Vitals Value Taken Time  ?BP    ?Temp    ?Pulse    ?Resp    ?SpO2    ? ? ?Last Pain:  ?Vitals:  ? 01/04/22 0730  ?TempSrc:   ?PainSc: 0-No pain  ?   ? ?Patients Stated Pain Goal: 5 (01/04/22 0650) ? ?Complications: No notable events documented. ?

## 2022-01-11 ENCOUNTER — Encounter (HOSPITAL_COMMUNITY): Payer: Self-pay | Admitting: Internal Medicine

## 2022-01-27 DIAGNOSIS — E538 Deficiency of other specified B group vitamins: Secondary | ICD-10-CM | POA: Diagnosis not present

## 2022-02-25 DIAGNOSIS — E538 Deficiency of other specified B group vitamins: Secondary | ICD-10-CM | POA: Diagnosis not present

## 2022-03-11 DIAGNOSIS — E039 Hypothyroidism, unspecified: Secondary | ICD-10-CM | POA: Diagnosis not present

## 2022-03-11 DIAGNOSIS — I10 Essential (primary) hypertension: Secondary | ICD-10-CM | POA: Diagnosis not present

## 2022-03-11 DIAGNOSIS — E1140 Type 2 diabetes mellitus with diabetic neuropathy, unspecified: Secondary | ICD-10-CM | POA: Diagnosis not present

## 2022-03-11 DIAGNOSIS — Z6835 Body mass index (BMI) 35.0-35.9, adult: Secondary | ICD-10-CM | POA: Diagnosis not present

## 2022-03-11 DIAGNOSIS — D6949 Other primary thrombocytopenia: Secondary | ICD-10-CM | POA: Diagnosis not present

## 2022-03-11 DIAGNOSIS — E785 Hyperlipidemia, unspecified: Secondary | ICD-10-CM | POA: Diagnosis not present

## 2022-03-11 DIAGNOSIS — M1A00X0 Idiopathic chronic gout, unspecified site, without tophus (tophi): Secondary | ICD-10-CM | POA: Diagnosis not present

## 2022-03-11 DIAGNOSIS — E119 Type 2 diabetes mellitus without complications: Secondary | ICD-10-CM | POA: Diagnosis not present

## 2022-03-11 DIAGNOSIS — D649 Anemia, unspecified: Secondary | ICD-10-CM | POA: Diagnosis not present

## 2022-03-11 DIAGNOSIS — E6609 Other obesity due to excess calories: Secondary | ICD-10-CM | POA: Diagnosis not present

## 2022-03-18 DIAGNOSIS — R7989 Other specified abnormal findings of blood chemistry: Secondary | ICD-10-CM | POA: Diagnosis not present

## 2022-03-18 DIAGNOSIS — L4059 Other psoriatic arthropathy: Secondary | ICD-10-CM | POA: Diagnosis not present

## 2022-03-18 DIAGNOSIS — M1A09X0 Idiopathic chronic gout, multiple sites, without tophus (tophi): Secondary | ICD-10-CM | POA: Diagnosis not present

## 2022-03-18 DIAGNOSIS — R768 Other specified abnormal immunological findings in serum: Secondary | ICD-10-CM | POA: Diagnosis not present

## 2022-03-18 DIAGNOSIS — E669 Obesity, unspecified: Secondary | ICD-10-CM | POA: Diagnosis not present

## 2022-03-18 DIAGNOSIS — Z6835 Body mass index (BMI) 35.0-35.9, adult: Secondary | ICD-10-CM | POA: Diagnosis not present

## 2022-03-18 DIAGNOSIS — L409 Psoriasis, unspecified: Secondary | ICD-10-CM | POA: Diagnosis not present

## 2022-03-18 DIAGNOSIS — M1991 Primary osteoarthritis, unspecified site: Secondary | ICD-10-CM | POA: Diagnosis not present

## 2022-03-29 DIAGNOSIS — E538 Deficiency of other specified B group vitamins: Secondary | ICD-10-CM | POA: Diagnosis not present

## 2022-03-31 ENCOUNTER — Ambulatory Visit: Payer: Medicare HMO | Admitting: Physician Assistant

## 2022-04-30 DIAGNOSIS — E538 Deficiency of other specified B group vitamins: Secondary | ICD-10-CM | POA: Diagnosis not present

## 2022-05-28 DIAGNOSIS — E538 Deficiency of other specified B group vitamins: Secondary | ICD-10-CM | POA: Diagnosis not present

## 2022-07-01 DIAGNOSIS — Z23 Encounter for immunization: Secondary | ICD-10-CM | POA: Diagnosis not present

## 2022-07-01 DIAGNOSIS — E538 Deficiency of other specified B group vitamins: Secondary | ICD-10-CM | POA: Diagnosis not present

## 2022-07-30 DIAGNOSIS — E538 Deficiency of other specified B group vitamins: Secondary | ICD-10-CM | POA: Diagnosis not present

## 2022-09-01 DIAGNOSIS — Z23 Encounter for immunization: Secondary | ICD-10-CM | POA: Diagnosis not present

## 2022-09-01 DIAGNOSIS — E538 Deficiency of other specified B group vitamins: Secondary | ICD-10-CM | POA: Diagnosis not present

## 2022-09-15 DIAGNOSIS — M1991 Primary osteoarthritis, unspecified site: Secondary | ICD-10-CM | POA: Diagnosis not present

## 2022-09-15 DIAGNOSIS — R768 Other specified abnormal immunological findings in serum: Secondary | ICD-10-CM | POA: Diagnosis not present

## 2022-09-15 DIAGNOSIS — E669 Obesity, unspecified: Secondary | ICD-10-CM | POA: Diagnosis not present

## 2022-09-15 DIAGNOSIS — L409 Psoriasis, unspecified: Secondary | ICD-10-CM | POA: Diagnosis not present

## 2022-09-15 DIAGNOSIS — M1A09X0 Idiopathic chronic gout, multiple sites, without tophus (tophi): Secondary | ICD-10-CM | POA: Diagnosis not present

## 2022-09-15 DIAGNOSIS — R7989 Other specified abnormal findings of blood chemistry: Secondary | ICD-10-CM | POA: Diagnosis not present

## 2022-09-15 DIAGNOSIS — L4059 Other psoriatic arthropathy: Secondary | ICD-10-CM | POA: Diagnosis not present

## 2022-09-15 DIAGNOSIS — Z6835 Body mass index (BMI) 35.0-35.9, adult: Secondary | ICD-10-CM | POA: Diagnosis not present

## 2022-09-27 DIAGNOSIS — Z1331 Encounter for screening for depression: Secondary | ICD-10-CM | POA: Diagnosis not present

## 2022-09-27 DIAGNOSIS — D649 Anemia, unspecified: Secondary | ICD-10-CM | POA: Diagnosis not present

## 2022-09-27 DIAGNOSIS — Z6834 Body mass index (BMI) 34.0-34.9, adult: Secondary | ICD-10-CM | POA: Diagnosis not present

## 2022-09-27 DIAGNOSIS — D6949 Other primary thrombocytopenia: Secondary | ICD-10-CM | POA: Diagnosis not present

## 2022-09-27 DIAGNOSIS — Z125 Encounter for screening for malignant neoplasm of prostate: Secondary | ICD-10-CM | POA: Diagnosis not present

## 2022-09-27 DIAGNOSIS — E039 Hypothyroidism, unspecified: Secondary | ICD-10-CM | POA: Diagnosis not present

## 2022-09-27 DIAGNOSIS — E6609 Other obesity due to excess calories: Secondary | ICD-10-CM | POA: Diagnosis not present

## 2022-09-27 DIAGNOSIS — Z0001 Encounter for general adult medical examination with abnormal findings: Secondary | ICD-10-CM | POA: Diagnosis not present

## 2022-09-27 DIAGNOSIS — E119 Type 2 diabetes mellitus without complications: Secondary | ICD-10-CM | POA: Diagnosis not present

## 2022-09-27 DIAGNOSIS — D518 Other vitamin B12 deficiency anemias: Secondary | ICD-10-CM | POA: Diagnosis not present

## 2022-10-27 DIAGNOSIS — D518 Other vitamin B12 deficiency anemias: Secondary | ICD-10-CM | POA: Diagnosis not present

## 2022-11-04 ENCOUNTER — Encounter: Payer: Self-pay | Admitting: Radiology

## 2022-11-30 DIAGNOSIS — D518 Other vitamin B12 deficiency anemias: Secondary | ICD-10-CM | POA: Diagnosis not present

## 2022-12-28 DIAGNOSIS — D518 Other vitamin B12 deficiency anemias: Secondary | ICD-10-CM | POA: Diagnosis not present

## 2023-01-28 DIAGNOSIS — D518 Other vitamin B12 deficiency anemias: Secondary | ICD-10-CM | POA: Diagnosis not present

## 2023-03-16 DIAGNOSIS — Z6833 Body mass index (BMI) 33.0-33.9, adult: Secondary | ICD-10-CM | POA: Diagnosis not present

## 2023-03-16 DIAGNOSIS — M1991 Primary osteoarthritis, unspecified site: Secondary | ICD-10-CM | POA: Diagnosis not present

## 2023-03-16 DIAGNOSIS — Z111 Encounter for screening for respiratory tuberculosis: Secondary | ICD-10-CM | POA: Diagnosis not present

## 2023-03-16 DIAGNOSIS — L409 Psoriasis, unspecified: Secondary | ICD-10-CM | POA: Diagnosis not present

## 2023-03-16 DIAGNOSIS — E669 Obesity, unspecified: Secondary | ICD-10-CM | POA: Diagnosis not present

## 2023-03-16 DIAGNOSIS — R7989 Other specified abnormal findings of blood chemistry: Secondary | ICD-10-CM | POA: Diagnosis not present

## 2023-03-16 DIAGNOSIS — M1A09X0 Idiopathic chronic gout, multiple sites, without tophus (tophi): Secondary | ICD-10-CM | POA: Diagnosis not present

## 2023-03-16 DIAGNOSIS — L4059 Other psoriatic arthropathy: Secondary | ICD-10-CM | POA: Diagnosis not present

## 2023-03-16 DIAGNOSIS — R768 Other specified abnormal immunological findings in serum: Secondary | ICD-10-CM | POA: Diagnosis not present

## 2023-04-01 DIAGNOSIS — D518 Other vitamin B12 deficiency anemias: Secondary | ICD-10-CM | POA: Diagnosis not present

## 2023-05-03 DIAGNOSIS — D518 Other vitamin B12 deficiency anemias: Secondary | ICD-10-CM | POA: Diagnosis not present

## 2023-05-30 DIAGNOSIS — E785 Hyperlipidemia, unspecified: Secondary | ICD-10-CM | POA: Diagnosis not present

## 2023-05-30 DIAGNOSIS — E1140 Type 2 diabetes mellitus with diabetic neuropathy, unspecified: Secondary | ICD-10-CM | POA: Diagnosis not present

## 2023-05-30 DIAGNOSIS — D518 Other vitamin B12 deficiency anemias: Secondary | ICD-10-CM | POA: Diagnosis not present

## 2023-05-30 DIAGNOSIS — Z23 Encounter for immunization: Secondary | ICD-10-CM | POA: Diagnosis not present

## 2023-05-30 DIAGNOSIS — I10 Essential (primary) hypertension: Secondary | ICD-10-CM | POA: Diagnosis not present

## 2023-05-30 DIAGNOSIS — M1A00X0 Idiopathic chronic gout, unspecified site, without tophus (tophi): Secondary | ICD-10-CM | POA: Diagnosis not present

## 2023-06-15 DIAGNOSIS — H5203 Hypermetropia, bilateral: Secondary | ICD-10-CM | POA: Diagnosis not present

## 2023-06-30 DIAGNOSIS — D518 Other vitamin B12 deficiency anemias: Secondary | ICD-10-CM | POA: Diagnosis not present

## 2023-08-01 DIAGNOSIS — D518 Other vitamin B12 deficiency anemias: Secondary | ICD-10-CM | POA: Diagnosis not present

## 2023-09-07 DIAGNOSIS — D518 Other vitamin B12 deficiency anemias: Secondary | ICD-10-CM | POA: Diagnosis not present

## 2023-09-16 DIAGNOSIS — Z6834 Body mass index (BMI) 34.0-34.9, adult: Secondary | ICD-10-CM | POA: Diagnosis not present

## 2023-09-16 DIAGNOSIS — E669 Obesity, unspecified: Secondary | ICD-10-CM | POA: Diagnosis not present

## 2023-09-16 DIAGNOSIS — R768 Other specified abnormal immunological findings in serum: Secondary | ICD-10-CM | POA: Diagnosis not present

## 2023-09-16 DIAGNOSIS — M1991 Primary osteoarthritis, unspecified site: Secondary | ICD-10-CM | POA: Diagnosis not present

## 2023-09-16 DIAGNOSIS — L4059 Other psoriatic arthropathy: Secondary | ICD-10-CM | POA: Diagnosis not present

## 2023-09-16 DIAGNOSIS — M1A09X0 Idiopathic chronic gout, multiple sites, without tophus (tophi): Secondary | ICD-10-CM | POA: Diagnosis not present

## 2023-09-16 DIAGNOSIS — L409 Psoriasis, unspecified: Secondary | ICD-10-CM | POA: Diagnosis not present

## 2023-09-16 DIAGNOSIS — R7989 Other specified abnormal findings of blood chemistry: Secondary | ICD-10-CM | POA: Diagnosis not present

## 2023-10-04 DIAGNOSIS — I10 Essential (primary) hypertension: Secondary | ICD-10-CM | POA: Diagnosis not present

## 2023-10-04 DIAGNOSIS — R251 Tremor, unspecified: Secondary | ICD-10-CM | POA: Diagnosis not present

## 2023-10-04 DIAGNOSIS — D6949 Other primary thrombocytopenia: Secondary | ICD-10-CM | POA: Diagnosis not present

## 2023-10-04 DIAGNOSIS — M1A00X0 Idiopathic chronic gout, unspecified site, without tophus (tophi): Secondary | ICD-10-CM | POA: Diagnosis not present

## 2023-10-04 DIAGNOSIS — D518 Other vitamin B12 deficiency anemias: Secondary | ICD-10-CM | POA: Diagnosis not present

## 2023-10-04 DIAGNOSIS — E785 Hyperlipidemia, unspecified: Secondary | ICD-10-CM | POA: Diagnosis not present

## 2023-10-04 DIAGNOSIS — M1991 Primary osteoarthritis, unspecified site: Secondary | ICD-10-CM | POA: Diagnosis not present

## 2023-10-04 DIAGNOSIS — Z0001 Encounter for general adult medical examination with abnormal findings: Secondary | ICD-10-CM | POA: Diagnosis not present

## 2023-10-04 DIAGNOSIS — E1140 Type 2 diabetes mellitus with diabetic neuropathy, unspecified: Secondary | ICD-10-CM | POA: Diagnosis not present

## 2023-10-06 DIAGNOSIS — D518 Other vitamin B12 deficiency anemias: Secondary | ICD-10-CM | POA: Diagnosis not present

## 2023-10-06 DIAGNOSIS — E1140 Type 2 diabetes mellitus with diabetic neuropathy, unspecified: Secondary | ICD-10-CM | POA: Diagnosis not present

## 2023-10-06 DIAGNOSIS — M1A00X0 Idiopathic chronic gout, unspecified site, without tophus (tophi): Secondary | ICD-10-CM | POA: Diagnosis not present

## 2023-10-06 DIAGNOSIS — Z0001 Encounter for general adult medical examination with abnormal findings: Secondary | ICD-10-CM | POA: Diagnosis not present

## 2023-10-06 DIAGNOSIS — K529 Noninfective gastroenteritis and colitis, unspecified: Secondary | ICD-10-CM | POA: Diagnosis not present

## 2023-10-06 DIAGNOSIS — E559 Vitamin D deficiency, unspecified: Secondary | ICD-10-CM | POA: Diagnosis not present

## 2023-10-06 DIAGNOSIS — Z125 Encounter for screening for malignant neoplasm of prostate: Secondary | ICD-10-CM | POA: Diagnosis not present

## 2023-10-06 DIAGNOSIS — D649 Anemia, unspecified: Secondary | ICD-10-CM | POA: Diagnosis not present

## 2023-10-26 DIAGNOSIS — Z1212 Encounter for screening for malignant neoplasm of rectum: Secondary | ICD-10-CM | POA: Diagnosis not present

## 2023-10-26 DIAGNOSIS — Z1211 Encounter for screening for malignant neoplasm of colon: Secondary | ICD-10-CM | POA: Diagnosis not present

## 2023-10-30 LAB — COLOGUARD: COLOGUARD: POSITIVE — AB

## 2023-11-03 ENCOUNTER — Encounter: Payer: Self-pay | Admitting: *Deleted

## 2023-11-04 DIAGNOSIS — D518 Other vitamin B12 deficiency anemias: Secondary | ICD-10-CM | POA: Diagnosis not present

## 2023-11-28 DIAGNOSIS — D518 Other vitamin B12 deficiency anemias: Secondary | ICD-10-CM | POA: Diagnosis not present

## 2023-11-29 ENCOUNTER — Ambulatory Visit (INDEPENDENT_AMBULATORY_CARE_PROVIDER_SITE_OTHER): Admitting: Internal Medicine

## 2023-11-29 ENCOUNTER — Encounter: Payer: Self-pay | Admitting: Internal Medicine

## 2023-11-29 VITALS — BP 135/78 | HR 74 | Temp 97.8°F | Ht 73.0 in | Wt 271.0 lb

## 2023-11-29 DIAGNOSIS — R195 Other fecal abnormalities: Secondary | ICD-10-CM | POA: Diagnosis not present

## 2023-11-29 DIAGNOSIS — K921 Melena: Secondary | ICD-10-CM

## 2023-11-29 DIAGNOSIS — Z860100 Personal history of colon polyps, unspecified: Secondary | ICD-10-CM | POA: Diagnosis not present

## 2023-11-29 DIAGNOSIS — R197 Diarrhea, unspecified: Secondary | ICD-10-CM | POA: Diagnosis not present

## 2023-11-29 NOTE — Addendum Note (Signed)
 Addended by: Zada Finders on: 11/29/2023 09:37 AM   Modules accepted: Orders

## 2023-11-29 NOTE — Patient Instructions (Addendum)
 It was good to see you today  As discussed, it would be best to go ahead and repeat your colonoscopy now.  Hematochezia ASA 3  Hold iron x 5 days  Diabetes medications per protocol.  Further recommendations to follow.

## 2023-11-29 NOTE — Progress Notes (Signed)
 Primary Care Physician:  Assunta Found, MD Primary Gastroenterologist:  Dr. Jena Gauss  Pre-Procedure History & Physical: HPI:  Jonathan Grimes is a 68 y.o. male here for evaluation of intermittent paper hematochezia.  Has diarrhea about 5 days out of the month the other days he has normal bowel function. Distant history of colonic polyps.  No polyps seen in 2023.  Also, recent labs included a Cologuard which came back positive.  Past Medical History:  Diagnosis Date   Arthritis    SEVERE OA LEFT HIP WITH PAIN   Diabetes mellitus without complication ([facility])    type 2   Elevated cholesterol    Fatty liver    GERD (gastroesophageal reflux disease)    H/O hiatal hernia    Hiatal hernia    Hypertension    Schatzki's ring    Sleep apnea    pt not officially diagnosed, but pt and family state he has it   Tubular adenoma     Past Surgical History:  Procedure Laterality Date   BIOPSY N/A 10/02/2015   Procedure: BIOPSY;  Surgeon: Corbin Ade, MD;  [facility]: [facility];  Service: Endoscopy;  Laterality: N/A;  Gastric bx   COLONOSCOPY  01/19/10   Dr.Molli Gethers- friable anorectal. o/w normal rectum, L sided diverticula, sigmoid polyp bx= tubular adenoma,   COLONOSCOPY N/A 10/02/2015   JWJ:[id number] appearing rectal/scattered sigmoid diverticula, next TCS five years for h/o colon adenomas   COLONOSCOPY WITH PROPOFOL N/A 01/04/2022   Procedure: COLONOSCOPY WITH PROPOFOL;  Surgeon: Corbin Ade, MD;  [facility]: [facility];  Service: Endoscopy;  Laterality: N/A;  7:30 / ASA 2   ESOPHAGOGASTRODUODENOSCOPY  01/19/10   Dr.Jacky Dross- noncritical schatzki's ring, o/w normal esophagus, small hiatal hernia o/w normal stomach D1, D2   ESOPHAGOGASTRODUODENOSCOPY N/A 10/02/2015   GNF:[id number] erosiona s/p bx (no h.pylri, gastric mucosa with intestinal metaplasia)   GIVENS CAPSULE STUDY N/A 11/21/2015   Procedure: GIVENS CAPSULE STUDY;  Surgeon: Corbin Ade, MD;  [facility]: [facility];  Service: Endoscopy;   Laterality: N/A;  0700   JOINT REPLACEMENT  2012   RIGHT TOTAL KNEE ARTHROPLASTY   TOTAL HIP ARTHROPLASTY  07/28/2012   Procedure: TOTAL HIP ARTHROPLASTY ANTERIOR APPROACH;  Surgeon: Kathryne Hitch, MD;  Location: [facility];  Service: Orthopedics;  Laterality: Left;   TOTAL HIP ARTHROPLASTY Right 02/22/2014   Procedure: RIGHT TOTAL HIP ARTHROPLASTY ANTERIOR APPROACH;  Surgeon: Kathryne Hitch, MD;  Location: [facility];  Service: Orthopedics;  Laterality: Right;   TOTAL KNEE REVISION Right 07/11/2015   Procedure: REVISION RIGHT TOTAL KNEE ARTHROPLASTYone component only;  Surgeon: Kathryne Hitch, MD;  Location: [facility];  Service: Orthopedics;  Laterality: Right;    Prior to Admission medications   Medication Sig Start Date End Date Taking? Authorizing Provider  allopurinol (ZYLOPRIM) 100 MG tablet Take 100 mg by mouth daily.   Yes [provider]  amLODipine (NORVASC) 10 MG tablet Take 10 mg by mouth daily.   Yes [provider]  aspirin EC 81 MG tablet Take 81 mg by mouth daily.   Yes [provider]  atorvastatin (LIPITOR) 10 MG tablet 1 tablet Orally Once a day for 30 day(s)   Yes [provider]  diclofenac (VOLTAREN) 75 MG EC tablet Take 75 mg by mouth 2 (two) times daily.   Yes [provider]  ENBREL SURECLICK 50 MG/ML injection once a week. 06/16/21  Yes [provider]  escitalopram (LEXAPRO) 10 MG tablet Take  10 mg by mouth daily. 09/13/23  Yes [provider]  ferrous sulfate 325 (65 FE) MG tablet Take 325 mg by mouth 2 (two) times daily.   Yes [provider]  levothyroxine (SYNTHROID) 25 MCG tablet daily at 6 (six) AM. 02/06/19  Yes [provider]  losartan (COZAAR) 100 MG tablet Take 100 mg by mouth daily.   Yes [provider]  MEGARED OMEGA-3 KRILL OIL PO Take 1 capsule by mouth daily.   Yes [provider]  metFORMIN (GLUCOPHAGE) 1000 MG tablet Take 1,000 mg by mouth  2 (two) times daily.   Yes [provider]  omeprazole (PRILOSEC) 40 MG capsule Take 40 mg by mouth daily.   Yes [provider]    Allergies as of 11/29/2023   (No Known Allergies)    No family history on file.  Social History   Socioeconomic History   Marital status: Married    Spouse name: Not on file   Number of children: Not on file   Years of education: Not on file   Highest education level: Not on file  Occupational History   Not on file  Tobacco Use   Smoking status: Former    Current packs/day: 0.00    Types: Cigarettes    Quit date: 01/06/1982    Years since quitting: 41.9   Smokeless tobacco: Current    Types: Chew   Tobacco comments:    QUIT SMOKING 34 YRS AGO  QUIT CHEWING TOBACCO JAN 2012  Substance and Sexual Activity   Alcohol use: Yes    Comment: beer occasionally   Drug use: No   Sexual activity: Not on file  Other Topics Concern   Not on file  Social History Narrative   Not on file   Social Drivers of Health   Financial Resource Strain: Not on file  Food Insecurity: Not on file  Transportation Needs: Not on file  Physical Activity: Not on file  Stress: Not on file  Social Connections: Not on file  Intimate Partner Violence: Not on file    Review of Systems: See HPI, otherwise negative ROS  Physical Exam: BP 135/78 (BP Location: Right Arm, Patient Position: Sitting, Cuff Size: Large)   Pulse 74   Temp 97.8 F (36.6 C) (Oral)   Ht 6\' 1"  (1.854 m)   Wt 271 lb (122.9 kg)   SpO2 93%   BMI 35.75 kg/m  General:   Alert,  well-nourished, pleasant and cooperative in NAD Neck:  Supple; no masses or thyromegaly. No significant cervical adenopathy. Lungs:  Clear throughout to auscultation.   No wheezes, crackles, or rhonchi. No acute distress. Heart:  Regular rate and rhythm; no murmurs, clicks, rubs,  or gallops. Abdomen: Non-distended, normal bowel sounds.  Soft an nontender without appreciable mass or hepatosplenomegaly.   Easily reducible umbilical hernia.   Impression/Plan: 68 year old gentleman with intermittent diarrhea punctuated by long periods of normal bowel function he reports paper hematochezia when he has diarrhea.  Positive Cologuard.  History of colonic polyps.  He needs an updated colonoscopy.  Recommendations: As discussed, it would be best to go ahead and repeat your colonoscopy now.  Hematochezia ASA 3.  The risks, benefits, limitations, alternatives and imponderables have been reviewed with the patient. Questions have been answered. All parties are agreeable.    Hold iron x 5 days  Diabetes medications per protocol.  Further recommendations to follow.    Notice: This dictation was prepared with Dragon dictation along with smaller  Lobbyist. Any transcriptional errors that result from this process are unintentional and may not be corrected upon review.

## 2023-12-06 ENCOUNTER — Encounter: Payer: Self-pay | Admitting: *Deleted

## 2023-12-06 MED ORDER — PEG 3350-KCL-NA BICARB-NACL 420 G PO SOLR: 4000.0000 mL | Freq: Once | ORAL | 0 refills | Status: AC

## 2023-12-06 NOTE — Telephone Encounter (Signed)
 PA approved via cohere Authorization #[id number]  01/04/2024 - 03/05/2024

## 2023-12-30 NOTE — Patient Instructions (Signed)
   Your procedure is scheduled on: 01/04/2024  Report to [facility] Main Entrance at  6:00   AM.  Call this number if you have problems the morning of surgery: [phone]   Remember:              Follow Directions on the letter you received from Your Physician's office regarding the Bowel Prep              No Smoking the day of Procedure :   Take these medicines the morning of surgery with A SIP OF WATER : Allopurinol, amlodipine, lexapro, levothyroxine and omeprazole  No diabetic medication am of procedure   Do not wear jewelry, make-up or nail polish.    Do not bring valuables to the hospital.  Contacts, dentures or bridgework may not be worn into surgery.  .   Patients discharged the day of surgery will not be allowed to drive home.     Colonoscopy, Adult, Care After This sheet gives you information about how to care for yourself after your procedure. Your health care provider may also give you more specific instructions. If you have problems or questions, contact your health care provider. What can I expect after the procedure? After the procedure, it is common to have: A small amount of blood in your stool for 24 hours after the procedure. Some gas. Mild abdominal cramping or bloating.  Follow these instructions at home: General instructions  For the first 24 hours after the procedure: Do not drive or use machinery. Do not sign important documents. Do not drink alcohol. Do your regular daily activities at a slower pace than normal. Eat soft, easy-to-digest foods. Rest often. Take over-the-counter or prescription medicines only as told by your health care provider. It is up to you to get the results of your procedure. Ask your health care provider, or the department performing the procedure, when your results will be ready. Relieving cramping and bloating Try walking around when you have cramps or feel bloated. Apply heat to your abdomen as told by your health care  provider. Use a heat source that your health care provider recommends, such as a moist heat pack or a heating pad. Place a towel between your skin and the heat source. Leave the heat on for 20-30 minutes. Remove the heat if your skin turns bright red. This is especially important if you are unable to feel pain, heat, or cold. You may have a greater risk of getting burned. Eating and drinking Drink enough fluid to keep your urine clear or pale yellow. Resume your normal diet as instructed by your health care provider. Avoid heavy or fried foods that are hard to digest. Avoid drinking alcohol for as long as instructed by your health care provider. Contact a health care provider if: You have blood in your stool 2-3 days after the procedure. Get help right away if: You have more than a small spotting of blood in your stool. You pass large blood clots in your stool. Your abdomen is swollen. You have nausea or vomiting. You have a fever. You have increasing abdominal pain that is not relieved with medicine. This information is not intended to replace advice given to you by your health care provider. Make sure you discuss any questions you have with your health care provider. Document Released: 03/30/2004 Document Revised: 05/10/2016 Document Reviewed: 10/28/2015 Elsevier Interactive Patient Education  [employer].

## 2024-01-02 ENCOUNTER — Encounter (HOSPITAL_COMMUNITY)
Admission: RE | Admit: 2024-01-02 | Discharge: 2024-01-02 | Disposition: A | Discharge status: Home / Self Care | Source: Ambulatory Visit | Attending: Internal Medicine | Admitting: Internal Medicine

## 2024-01-02 ENCOUNTER — Other Ambulatory Visit: Payer: Self-pay

## 2024-01-02 ENCOUNTER — Encounter (HOSPITAL_COMMUNITY): Payer: Self-pay

## 2024-01-02 VITALS — BP 126/80 | HR 73 | Temp 97.8°F | Resp 18 | Ht 73.0 in | Wt 270.9 lb

## 2024-01-02 DIAGNOSIS — I10 Essential (primary) hypertension: Secondary | ICD-10-CM | POA: Diagnosis not present

## 2024-01-02 DIAGNOSIS — Z01818 Encounter for other preprocedural examination: Secondary | ICD-10-CM | POA: Insufficient documentation

## 2024-01-02 DIAGNOSIS — D509 Iron deficiency anemia, unspecified: Secondary | ICD-10-CM | POA: Diagnosis not present

## 2024-01-02 DIAGNOSIS — E119 Type 2 diabetes mellitus without complications: Secondary | ICD-10-CM | POA: Insufficient documentation

## 2024-01-02 LAB — COMPREHENSIVE METABOLIC PANEL WITH GFR
ALT: 28 U/L (ref 0–44)
AST: 47 U/L — ABNORMAL HIGH (ref 15–41)
Albumin: 3.1 g/dL — ABNORMAL LOW (ref 3.5–5.0)
Alkaline Phosphatase: 113 U/L (ref 38–126)
Anion gap: 15 (ref 5–15)
BUN: 11 mg/dL (ref 8–23)
CO2: 20 mmol/L — ABNORMAL LOW (ref 22–32)
Calcium: 8.7 mg/dL — ABNORMAL LOW (ref 8.9–10.3)
Chloride: 102 mmol/L (ref 98–111)
Creatinine, Ser: 1.05 mg/dL (ref 0.61–1.24)
GFR, Estimated: >60 mL/min (ref >60)
Glucose, Bld: 127 mg/dL — ABNORMAL HIGH (ref 70–99)
Potassium: 3.8 mmol/L (ref 3.5–5.1)
Sodium: 137 mmol/L (ref 135–145)
Total Bilirubin: 0.3 mg/dL (ref 0.0–1.2)
Total Protein: 6.7 g/dL (ref 6.5–8.1)

## 2024-01-02 LAB — CBC WITH DIFFERENTIAL/PLATELET
Abs Immature Granulocytes: 0.02 10*3/uL (ref 0.00–0.07)
Basophils Absolute: 0 10*3/uL (ref 0.0–0.1)
Basophils Relative: 1 %
Eosinophils Absolute: 0.7 10*3/uL — ABNORMAL HIGH (ref 0.0–0.5)
Eosinophils Relative: 13 %
HCT: 31.3 % — ABNORMAL LOW (ref 39.0–52.0)
Hemoglobin: 10.2 g/dL — ABNORMAL LOW (ref 13.0–17.0)
Immature Granulocytes: 0 %
Lymphocytes Relative: 21 %
Lymphs Abs: 1.2 10*3/uL (ref 0.7–4.0)
MCH: 32.7 pg (ref 26.0–34.0)
MCHC: 32.6 g/dL (ref 30.0–36.0)
MCV: 100.3 fL — ABNORMAL HIGH (ref 80.0–100.0)
Monocytes Absolute: 0.6 10*3/uL (ref 0.1–1.0)
Monocytes Relative: 10 %
Neutro Abs: 3 10*3/uL (ref 1.7–7.7)
Neutrophils Relative %: 55 %
Platelets: 232 10*3/uL (ref 150–400)
RBC: 3.12 MIL/uL — ABNORMAL LOW (ref 4.22–5.81)
RDW: 15.6 % — ABNORMAL HIGH (ref 11.5–15.5)
WBC: 5.5 10*3/uL (ref 4.0–10.5)
nRBC: 0 % (ref 0.0–0.2)

## 2024-01-03 DIAGNOSIS — D518 Other vitamin B12 deficiency anemias: Secondary | ICD-10-CM | POA: Diagnosis not present

## 2024-01-04 ENCOUNTER — Encounter (HOSPITAL_COMMUNITY)
Admission: RE | Disposition: A | Discharge status: Home / Self Care | Payer: Self-pay | Source: Home / Self Care | Attending: Internal Medicine

## 2024-01-04 ENCOUNTER — Ambulatory Visit (HOSPITAL_COMMUNITY)
Admission: RE | Admit: 2024-01-04 | Discharge: 2024-01-04 | Disposition: A | Discharge status: Home / Self Care | Attending: Internal Medicine | Admitting: Internal Medicine

## 2024-01-04 ENCOUNTER — Ambulatory Visit (HOSPITAL_COMMUNITY): Admitting: Certified Registered"

## 2024-01-04 ENCOUNTER — Ambulatory Visit (HOSPITAL_BASED_OUTPATIENT_CLINIC_OR_DEPARTMENT_OTHER): Admitting: Certified Registered"

## 2024-01-04 ENCOUNTER — Encounter (HOSPITAL_COMMUNITY): Payer: Self-pay | Admitting: Internal Medicine

## 2024-01-04 DIAGNOSIS — K279 Peptic ulcer, site unspecified, unspecified as acute or chronic, without hemorrhage or perforation: Secondary | ICD-10-CM | POA: Diagnosis not present

## 2024-01-04 DIAGNOSIS — Z860101 Personal history of adenomatous and serrated colon polyps: Secondary | ICD-10-CM | POA: Diagnosis not present

## 2024-01-04 DIAGNOSIS — K219 Gastro-esophageal reflux disease without esophagitis: Secondary | ICD-10-CM | POA: Diagnosis not present

## 2024-01-04 DIAGNOSIS — R195 Other fecal abnormalities: Secondary | ICD-10-CM | POA: Diagnosis not present

## 2024-01-04 DIAGNOSIS — K760 Fatty (change of) liver, not elsewhere classified: Secondary | ICD-10-CM | POA: Insufficient documentation

## 2024-01-04 DIAGNOSIS — K920 Hematemesis: Secondary | ICD-10-CM | POA: Diagnosis not present

## 2024-01-04 DIAGNOSIS — Z7984 Long term (current) use of oral hypoglycemic drugs: Secondary | ICD-10-CM | POA: Diagnosis not present

## 2024-01-04 DIAGNOSIS — I10 Essential (primary) hypertension: Secondary | ICD-10-CM

## 2024-01-04 DIAGNOSIS — Z79890 Hormone replacement therapy (postmenopausal): Secondary | ICD-10-CM | POA: Insufficient documentation

## 2024-01-04 DIAGNOSIS — E119 Type 2 diabetes mellitus without complications: Secondary | ICD-10-CM | POA: Diagnosis not present

## 2024-01-04 DIAGNOSIS — K649 Unspecified hemorrhoids: Secondary | ICD-10-CM

## 2024-01-04 DIAGNOSIS — Z1211 Encounter for screening for malignant neoplasm of colon: Secondary | ICD-10-CM | POA: Diagnosis not present

## 2024-01-04 DIAGNOSIS — G4730 Sleep apnea, unspecified: Secondary | ICD-10-CM | POA: Diagnosis not present

## 2024-01-04 DIAGNOSIS — K449 Diaphragmatic hernia without obstruction or gangrene: Secondary | ICD-10-CM | POA: Diagnosis not present

## 2024-01-04 DIAGNOSIS — Z7982 Long term (current) use of aspirin: Secondary | ICD-10-CM | POA: Diagnosis not present

## 2024-01-04 DIAGNOSIS — Z79899 Other long term (current) drug therapy: Secondary | ICD-10-CM | POA: Insufficient documentation

## 2024-01-04 DIAGNOSIS — D649 Anemia, unspecified: Secondary | ICD-10-CM | POA: Diagnosis not present

## 2024-01-04 DIAGNOSIS — Z791 Long term (current) use of non-steroidal anti-inflammatories (NSAID): Secondary | ICD-10-CM | POA: Diagnosis not present

## 2024-01-04 DIAGNOSIS — K641 Second degree hemorrhoids: Secondary | ICD-10-CM | POA: Diagnosis not present

## 2024-01-04 DIAGNOSIS — Q438 Other specified congenital malformations of intestine: Secondary | ICD-10-CM | POA: Insufficient documentation

## 2024-01-04 DIAGNOSIS — Z87891 Personal history of nicotine dependence: Secondary | ICD-10-CM

## 2024-01-04 DIAGNOSIS — K644 Residual hemorrhoidal skin tags: Secondary | ICD-10-CM | POA: Insufficient documentation

## 2024-01-04 DIAGNOSIS — K5730 Diverticulosis of large intestine without perforation or abscess without bleeding: Secondary | ICD-10-CM | POA: Diagnosis not present

## 2024-01-04 DIAGNOSIS — E7800 Pure hypercholesterolemia, unspecified: Secondary | ICD-10-CM | POA: Insufficient documentation

## 2024-01-04 DIAGNOSIS — K921 Melena: Secondary | ICD-10-CM | POA: Diagnosis not present

## 2024-01-04 HISTORY — PX: COLONOSCOPY: SHX5424

## 2024-01-04 LAB — GLUCOSE, CAPILLARY: Glucose-Capillary: 101 mg/dL — ABNORMAL HIGH (ref 70–99)

## 2024-01-04 SURGERY — COLONOSCOPY
Anesthesia: General

## 2024-01-04 MED ORDER — PROPOFOL 10 MG/ML IV BOLUS
INTRAVENOUS | Status: DC | PRN
Start: 2024-01-04 — End: 2024-01-04
  Administered 2024-01-04: 100 mg via INTRAVENOUS
  Administered 2024-01-04 (×3): 50 mg via INTRAVENOUS

## 2024-01-04 MED ORDER — LACTATED RINGERS IV SOLN
INTRAVENOUS | Status: DC | PRN
Start: 2024-01-04 — End: 2024-01-04

## 2024-01-04 MED ORDER — PROPOFOL 500 MG/50ML IV EMUL
INTRAVENOUS | Status: DC | PRN
  Administered 2024-01-04: 150 ug/kg/min via INTRAVENOUS

## 2024-01-04 MED ORDER — LIDOCAINE 2% (20 MG/ML) 5 ML SYRINGE
INTRAMUSCULAR | Status: DC | PRN
  Administered 2024-01-04: 100 mg via INTRAVENOUS

## 2024-01-04 NOTE — Anesthesia Procedure Notes (Signed)
 Date/Time: 01/04/2024 7:29 AM  Performed by: Sherwin Donate, CRNAPre-anesthesia Checklist: Patient identified, Emergency Drugs available, Suction available and Patient being monitored Patient Re-evaluated:Patient Re-evaluated prior to induction Oxygen Delivery Method: Nasal cannula Induction Type: IV induction Placement Confirmation: positive ETCO2 Comments: Optiflow High Flow [place] O2 used

## 2024-01-04 NOTE — Op Note (Signed)
 [facility] Patient Name: Jonathan Grimes Procedure Date: 01/04/2024 7:07 AM MRN: [id number] Date of Birth: 20-Oct-1955 Attending MD: Gemma Kelp , MD, [id number] CSN: [id number] Age: 68 Admit Type: Outpatient Procedure:                Colonoscopy Indications:              Hematochezia Providers:                Gemma Kelp, MD, Karyle Pagoda, RN, [place]                            Wilson, Technician, Theola Fitch Referring MD:              Medicines:                Propofol  per Anesthesia Complications:            No immediate complications. Estimated Blood Loss:     Estimated blood loss: none. Procedure:                Pre-Anesthesia Assessment:                           - Prior to the procedure, a History and Physical                            was performed, and patient medications and                            allergies were reviewed. The patient's tolerance of                            previous anesthesia was also reviewed. The risks                            and benefits of the procedure and the sedation                            options and risks were discussed with the patient.                            All questions were answered, and informed consent                            was obtained. Prior Anticoagulants: The patient has                            taken no anticoagulant or antiplatelet agents. ASA                            Grade Assessment: III - A patient with severe                            systemic disease. After reviewing the risks and  benefits, the patient was deemed in satisfactory                            condition to undergo the procedure.                           After obtaining informed consent, the colonoscope                            was passed under direct vision. Throughout the                            procedure, the patient's blood pressure, pulse, and                            oxygen  saturations were monitored continuously. The                            [id number]-HQ190L ([id number]) scope was introduced through                            the anus and advanced to the the cecum, identified                            by appendiceal orifice and ileocecal valve. The                            colonoscopy was performed without difficulty. The                            patient tolerated the procedure well. The quality                            of the bowel preparation was adequate. The entire                            colon was well visualized. The patient tolerated                            the procedure well. Scope In: 7:35:22 AM Scope Out: 7:57:40 AM Scope Withdrawal Time: 0 hours 13 minutes 58 seconds  Total Procedure Duration: 0 hours 22 minutes 18 seconds  Findings:      Hemorrhoids were found on perianal exam. Redundant colon. External       abdominal pressure required to reach the cecum      Non-bleeding external and internal hemorrhoids were found during       retroflexion. The hemorrhoids were moderate, medium-sized and Grade II       (internal hemorrhoids that prolapse but reduce spontaneously).      Scattered medium-mouthed diverticula were found in the sigmoid colon and       descending colon.      The exam was otherwise without abnormality on direct and retroflexion       views. Impression:               -  Hemorrhoids found on perianal exam.                           - Non-bleeding external and internal hemorrhoids.                            Grade 2                           - Moderate diverticulosis in the sigmoid colon and                            in the descending colon. Redundant colon                           - The examination was otherwise normal on direct                            and retroflexion views.                           - No specimens collected.                           - I suspect patient has bled from hemorrhoids.                             Would be a reasonably good hemorrhoid banding                            candidate. Moderate Sedation:      Moderate (conscious) sedation was personally administered by an       anesthesia professional. The following parameters were monitored: oxygen       saturation, heart rate, blood pressure, respiratory rate, EKG, adequacy       of pulmonary ventilation, and response to care. Recommendation:           - Patient has a contact number available for                            emergencies. The signs and symptoms of potential                            delayed complications were discussed with the                            patient. Return to normal activities tomorrow.                            Written discharge instructions were provided to the                            patient.                           - Advance diet as tolerated.                           -  Continue present medications.                           - Repeat colonoscopy in 7 years for surveillance.                           - Return to GI office in 1 month for possible                            hemorrhoid banding. Hemorrhoid banding pamphlet                            provided today. Procedure Code(s):        --- Professional ---                           [id number], Colonoscopy, flexible; diagnostic, including                            collection of specimen(s) by brushing or washing,                            when performed (separate procedure) Diagnosis Code(s):        --- Professional ---                           K64.1, Second degree hemorrhoids                           K92.1, Melena (includes Hematochezia)                           K57.30, Diverticulosis of large intestine without                            perforation or abscess without bleeding CPT copyright 2022 American Medical Association. All rights reserved. The codes documented in this report are preliminary and upon coder review may  be revised to  meet current compliance requirements. Windsor Hatcher. Jeorgia Helming, MD Gemma Kelp, MD 01/04/2024 8:08:00 AM This report has been signed electronically. Number of Addenda: 0

## 2024-01-04 NOTE — H&P (Signed)
 @LOGO @   Primary Care Physician:  Minus Amel, MD Primary Gastroenterologist:  Dr. Riley Cheadle  Pre-Procedure History & Physical: HPI:  Jonathan Grimes is a 68 y.o. male here for a colonoscopy.  Paper hematochezia.  Positive Cologuard.  History of colonic polyps.  Past Medical History:  Diagnosis Date   Arthritis    SEVERE OA LEFT HIP WITH PAIN   Diabetes mellitus without complication (HCC)    type 2   Elevated cholesterol    Fatty liver    GERD (gastroesophageal reflux disease)    H/O hiatal hernia    Hiatal hernia    Hypertension    Schatzki's ring    Sleep apnea    Tubular adenoma     Past Surgical History:  Procedure Laterality Date   BIOPSY N/A 10/02/2015   Procedure: BIOPSY;  Surgeon: Suzette Espy, MD;  [facility];  Service: Endoscopy;  Laterality: N/A;  Gastric bx   COLONOSCOPY  01/19/10   Dr.Kairi Harshbarger- friable anorectal. o/w normal rectum, L sided diverticula, sigmoid polyp bx= tubular adenoma,   COLONOSCOPY N/A 10/02/2015   GNF:AOZHYQ appearing rectal/scattered sigmoid diverticula, next TCS five years for h/o colon adenomas   COLONOSCOPY WITH PROPOFOL  N/A 01/04/2022   Procedure: COLONOSCOPY WITH PROPOFOL ;  Surgeon: Suzette Espy, MD;  Location: [facility];  Service: Endoscopy;  Laterality: N/A;  7:30 / ASA 2   ESOPHAGOGASTRODUODENOSCOPY  01/19/10   Dr.Velina Drollinger- noncritical schatzki's ring, o/w normal esophagus, small hiatal hernia o/w normal stomach D1, D2   ESOPHAGOGASTRODUODENOSCOPY N/A 10/02/2015   [facility]:QIONGE erosiona s/p bx (no h.pylri, gastric mucosa with intestinal metaplasia)   GIVENS CAPSULE STUDY N/A 11/21/2015   Procedure: GIVENS CAPSULE STUDY;  Surgeon: Suzette Espy, MD;  Location: [facility];  Service: Endoscopy;  Laterality: N/A;  0700   JOINT REPLACEMENT  2012   RIGHT TOTAL KNEE ARTHROPLASTY   TOTAL HIP ARTHROPLASTY  07/28/2012   Procedure: TOTAL HIP ARTHROPLASTY ANTERIOR APPROACH;  Surgeon: Arnie Lao, MD;  Location: [facility];   Service: Orthopedics;  Laterality: Left;   TOTAL HIP ARTHROPLASTY Right 02/22/2014   Procedure: RIGHT TOTAL HIP ARTHROPLASTY ANTERIOR APPROACH;  Surgeon: Arnie Lao, MD;  Location: [facility];  Service: Orthopedics;  Laterality: Right;   TOTAL KNEE REVISION Right 07/11/2015   Procedure: REVISION RIGHT TOTAL KNEE ARTHROPLASTYone component only;  Surgeon: Arnie Lao, MD;  Location: [facility];  Service: Orthopedics;  Laterality: Right;    Prior to Admission medications   Medication Sig Start Date End Date Taking? Authorizing Provider  allopurinol (ZYLOPRIM) 100 MG tablet Take 100 mg by mouth daily.   Yes [provider]  amLODipine (NORVASC) 10 MG tablet Take 10 mg by mouth daily.   Yes [provider]  aspirin  EC 81 MG tablet Take 81 mg by mouth daily.   Yes [provider]  atorvastatin  (LIPITOR) 10 MG tablet 1 tablet Orally Once a day for 30 day(s)   Yes [provider]  diclofenac (VOLTAREN) 75 MG EC tablet Take 75 mg by mouth 2 (two) times daily.   Yes [provider]  escitalopram (LEXAPRO) 10 MG tablet Take 10 mg by mouth daily. 09/13/23  Yes [provider]  levothyroxine (SYNTHROID) 25 MCG tablet daily at 6 (six) AM. 02/06/19  Yes [provider]  losartan  (COZAAR ) 100 MG tablet Take 100 mg by mouth daily.   Yes [provider]  metFORMIN  (GLUCOPHAGE ) 1000 MG tablet Take 1,000 mg by mouth 2 (two) times  daily.   Yes [provider]  omeprazole (PRILOSEC) 40 MG capsule Take 40 mg by mouth daily.   Yes [provider]  ENBREL SURECLICK 50 MG/ML injection once a week. 06/16/21   [provider]  ferrous sulfate  325 (65 FE) MG tablet Take 325 mg by mouth 2 (two) times daily.    [provider]    Allergies as of 12/06/2023   (No Known Allergies)    History reviewed. No pertinent family history.  Social History   Socioeconomic History   Marital status: Married     Spouse name: Not on file   Number of children: Not on file   Years of education: Not on file   Highest education level: Not on file  Occupational History   Not on file  Tobacco Use   Smoking status: Former    Current packs/day: 0.00    Types: Cigarettes    Quit date: 01/06/1982    Years since quitting: 42.0   Smokeless tobacco: Current    Types: Chew   Tobacco comments:    QUIT SMOKING 34 YRS AGO  QUIT CHEWING TOBACCO JAN 2012  Substance and Sexual Activity   Alcohol use: Yes    Comment: beer occasionally   Drug use: No   Sexual activity: Not on file  Other Topics Concern   Not on file  Social History Narrative   Not on file   Social Drivers of Health   Financial Resource Strain: Not on file  Food Insecurity: Not on file  Transportation Needs: Not on file  Physical Activity: Not on file  Stress: Not on file  Social Connections: Not on file  Intimate Partner Violence: Not on file    Review of Systems: See HPI, otherwise negative ROS  Physical Exam: BP (!) 161/84 (BP Location: Right Arm)   Pulse 98   Temp 98.4 F (36.9 C)   Resp (!) 23   SpO2 96%  General:   Alert,  Well-developed, well-nourished, pleasant and cooperative in NAD Heart:  Regular rate and rhythm; no murmurs, clicks, rubs,  or gallops. Abdomen: Non-distended, normal bowel sounds.  Soft and nontender without appreciable mass or hepatosplenomegaly.   Impression/Plan: 68 year old gentleman with positive Cologuard.  History of colonic polyps.  Paper hematochezia here for colonoscopy.  The risks, benefits, limitations, alternatives and imponderables have been reviewed with the patient. Questions have been answered. All parties are agreeable.       Notice: This dictation was prepared with Dragon dictation along with smaller phrase technology. Any transcriptional errors that result from this process are unintentional and may not be corrected upon review.

## 2024-01-04 NOTE — Discharge Instructions (Signed)
  Colonoscopy Discharge Instructions  Read the instructions outlined below and refer to this sheet in the next few weeks. These discharge instructions provide you with general information on caring for yourself after you leave the hospital. Your doctor may also give you specific instructions. While your treatment has been planned according to the most current medical practices available, unavoidable complications occasionally occur. If you have any problems or questions after discharge, call Dr. Riley Cheadle at [phone]. ACTIVITY You may resume your regular activity, but move at a slower pace for the next 24 hours.  Take frequent rest periods for the next 24 hours.  Walking will help get rid of the air and reduce the bloated feeling in your belly (abdomen).  No driving for 24 hours (because of the medicine (anesthesia) used during the test).   Do not sign any important legal documents or operate any machinery for 24 hours (because of the anesthesia used during the test).  NUTRITION Drink plenty of fluids.  You may resume your normal diet as instructed by your doctor.  Begin with a light meal and progress to your normal diet. Heavy or fried foods are harder to digest and may make you feel sick to your stomach (nauseated).  Avoid alcoholic beverages for 24 hours or as instructed.  MEDICATIONS You may resume your normal medications unless your doctor tells you otherwise.  WHAT YOU CAN EXPECT TODAY Some feelings of bloating in the abdomen.  Passage of more gas than usual.  Spotting of blood in your stool or on the toilet paper.  IF YOU HAD POLYPS REMOVED DURING THE COLONOSCOPY: No aspirin  products for 7 days or as instructed.  No alcohol for 7 days or as instructed.  Eat a soft diet for the next 24 hours.  FINDING OUT THE RESULTS OF YOUR TEST Not all test results are available during your visit. If your test results are not back during the visit, make an appointment with your caregiver to find out the  results. Do not assume everything is normal if you have not heard from your caregiver or the medical facility. It is important for you to follow up on all of your test results.  SEEK IMMEDIATE MEDICAL ATTENTION IF: You have more than a spotting of blood in your stool.  Your belly is swollen (abdominal distention).  You are nauseated or vomiting.  You have a temperature over 101.  You have abdominal pain or discomfort that is severe or gets worse throughout the day.      you do have some diverticulosis on the left side of your colon.   know polyps found today.  You do have hemorrhoids just on the outside and on the inside.  Grade 2.  Likely bleeding from hemorrhoids.  Would likely benefit from hemorrhoid banding  Pamphlet on hemorrhoid banding provided  Office visit with Karna Pacas in 1 month for possible hemorrhoid banding   recommend a repeat colonoscopy in 7 years   at patient request, I called Ammon Bales at [phone] findings and recommendations

## 2024-01-04 NOTE — Anesthesia Preprocedure Evaluation (Signed)
 Anesthesia Evaluation  Patient identified by MRN, date of birth, ID band Patient awake    Reviewed: Allergy & Precautions, H&P , NPO status , Patient's Chart, lab work & pertinent test results, reviewed documented beta blocker date and time   Airway Mallampati: II  TM Distance: >3 FB Neck ROM: full    Dental no notable dental hx.    Pulmonary sleep apnea , former smoker   Pulmonary exam normal breath sounds clear to auscultation       Cardiovascular Exercise Tolerance: Good hypertension,  Rhythm:regular Rate:Normal     Neuro/Psych  Neuromuscular disease  negative psych ROS   GI/Hepatic Neg liver ROS, hiatal hernia, PUD,GERD  ,,  Endo/Other  diabetes    Renal/GU negative Renal ROS  negative genitourinary   Musculoskeletal   Abdominal   Peds  Hematology  (+) Blood dyscrasia, anemia   Anesthesia Other Findings   Reproductive/Obstetrics negative OB ROS                             Anesthesia Physical Anesthesia Plan  ASA: 3  Anesthesia Plan: General   Post-op Pain Management:    Induction:   PONV Risk Score and Plan: Propofol  infusion  Airway Management Planned:   Additional Equipment:   Intra-op Plan:   Post-operative Plan:   Informed Consent: I have reviewed the patients History and Physical, chart, labs and discussed the procedure including the risks, benefits and alternatives for the proposed anesthesia with the patient or authorized representative who has indicated his/her understanding and acceptance.     Dental Advisory Given  Plan Discussed with: CRNA  Anesthesia Plan Comments:        Anesthesia Quick Evaluation

## 2024-01-04 NOTE — Transfer of Care (Signed)
 Immediate Anesthesia Transfer of Care Note  Patient: Jonathan Grimes  Procedure(s) Performed: COLONOSCOPY  Patient Location: Short Stay  Anesthesia Type:General  Level of Consciousness: awake, alert , and oriented  Airway & Oxygen Therapy: Patient Spontanous Breathing  Post-op Assessment: Report given to RN and Post -op Vital signs reviewed and stable  Post vital signs: Reviewed and stable  Last Vitals:  Vitals Value Taken Time  BP    Temp    Pulse    Resp    SpO2      Last Pain:  Vitals:   01/04/24 0730  PainSc: 0-No pain         Complications: No notable events documented.

## 2024-01-05 ENCOUNTER — Encounter (HOSPITAL_COMMUNITY): Payer: Self-pay | Admitting: Internal Medicine

## 2024-01-05 ENCOUNTER — Telehealth: Payer: Self-pay | Admitting: Neurology

## 2024-01-05 NOTE — Anesthesia Postprocedure Evaluation (Signed)
 Anesthesia Post Note  Patient: Jonathan Grimes  Procedure(s) Performed: COLONOSCOPY  Patient location during evaluation: Phase II Anesthesia Type: General Level of consciousness: awake Pain management: pain level controlled Vital Signs Assessment: post-procedure vital signs reviewed and stable Respiratory status: spontaneous breathing and respiratory function stable Cardiovascular status: blood pressure returned to baseline and stable Postop Assessment: no headache and no apparent nausea or vomiting Anesthetic complications: no Comments: Late entry   No notable events documented.   Last Vitals:  Vitals:   01/04/24 0645 01/04/24 0801  BP: (!) 161/84 135/70  Pulse: 98 90  Resp: (!) 23 20  Temp: 36.9 C 36.6 C  SpO2: 96% 99%    Last Pain:  Vitals:   01/04/24 0801  TempSrc: Oral  PainSc: 0-No pain                 Coretha Dew

## 2024-01-05 NOTE — Telephone Encounter (Signed)
 r/s appointment , wife reports pt will be recovering from a medical procedure

## 2024-01-09 ENCOUNTER — Ambulatory Visit: Payer: Medicare HMO | Admitting: Neurology

## 2024-01-31 DIAGNOSIS — Z6835 Body mass index (BMI) 35.0-35.9, adult: Secondary | ICD-10-CM | POA: Diagnosis not present

## 2024-01-31 DIAGNOSIS — M1A00X0 Idiopathic chronic gout, unspecified site, without tophus (tophi): Secondary | ICD-10-CM | POA: Diagnosis not present

## 2024-01-31 DIAGNOSIS — T461X5A Adverse effect of calcium-channel blockers, initial encounter: Secondary | ICD-10-CM | POA: Diagnosis not present

## 2024-01-31 DIAGNOSIS — R609 Edema, unspecified: Secondary | ICD-10-CM | POA: Diagnosis not present

## 2024-01-31 DIAGNOSIS — D518 Other vitamin B12 deficiency anemias: Secondary | ICD-10-CM | POA: Diagnosis not present

## 2024-01-31 DIAGNOSIS — E6609 Other obesity due to excess calories: Secondary | ICD-10-CM | POA: Diagnosis not present

## 2024-02-09 ENCOUNTER — Ambulatory Visit: Admitting: Gastroenterology

## 2024-02-09 ENCOUNTER — Encounter: Payer: Self-pay | Admitting: Gastroenterology

## 2024-02-09 VITALS — BP 119/75 | HR 76 | Temp 97.5°F | Ht 73.0 in | Wt 269.4 lb

## 2024-02-09 DIAGNOSIS — K641 Second degree hemorrhoids: Secondary | ICD-10-CM

## 2024-02-09 MED ORDER — NYSTATIN-TRIAMCINOLONE 100000-0.1 UNIT/GM-% EX OINT: 1.0000 | TOPICAL_OINTMENT | Freq: Two times a day (BID) | CUTANEOUS | 1 refills | Status: DC

## 2024-02-09 NOTE — Patient Instructions (Signed)
 I have sent in a cream to use between your buttocks to help with redness. Use twice a day and see if this helps!   Please avoid straining.  You should limit your toilet time to 2-3 minutes at the most.   I recommend Benefiber 2 teaspoons each morning in the beverage of your choice!  Please call me with any concerns or issues!  I will see you in follow-up for additional banding in several weeks.  It was a pleasure to see you today. I want to create trusting relationships with patients and provide genuine, compassionate, and quality care. I truly value your feedback, so please be on the lookout for a survey regarding your visit with me today. I appreciate your time in completing this!         Delman Ferns, PhD, ANP-BC [facility]

## 2024-02-09 NOTE — Progress Notes (Signed)
      [facility] BANDING PROCEDURE NOTE  Gaege Sangalang is a 68 y.o. male presenting today for consideration of hemorrhoid banding. Last colonoscopy May 2025: non-bleeding external and internal hemorrhoids, moderate diverticulosis in sigmoid and descending, redundant colon, otherwise normal. Has diarrhea about 3-4 times a week, usually triggered with spicier foods. Soft BM on other days. No prolonged toilet time. Occasional straining. Rectal bleeding has been biggest issue.    The patient presents with symptomatic grade 2 hemorrhoids, unresponsive to maximal medical therapy, requesting rubber band ligation of his hemorrhoidal disease. All risks, benefits, and alternative forms of therapy were described and informed consent was obtained.    The decision was made to band the left lateral internal hemorrhoid, and the [facility] O'Regan System was used to perform band ligation without complication. Digital anorectal examination was then performed to assure proper positioning of the band, and to adjust the banded tissue as required. The patient was discharged home without pain or other issues. Dietary and behavioral recommendations were given, along with follow-up instructions. The patient will return in several weeks for followup and possible additional banding as required. I did note redness between buttocks consistent with likely yeast. He has been using cortisone. I have sent in mycolog to try BID between buttocks instead.   No complications were encountered and the patient tolerated the procedure well.   Delman Ferns, PhD, ANP-BC [facility] Gastroenter[facility]

## 2024-03-05 DIAGNOSIS — D518 Other vitamin B12 deficiency anemias: Secondary | ICD-10-CM | POA: Diagnosis not present

## 2024-03-08 ENCOUNTER — Ambulatory Visit (INDEPENDENT_AMBULATORY_CARE_PROVIDER_SITE_OTHER): Admitting: Gastroenterology

## 2024-03-08 ENCOUNTER — Encounter: Payer: Self-pay | Admitting: Gastroenterology

## 2024-03-08 VITALS — BP 137/76 | HR 84 | Temp 97.6°F | Ht 73.0 in | Wt 262.0 lb

## 2024-03-08 DIAGNOSIS — K641 Second degree hemorrhoids: Secondary | ICD-10-CM

## 2024-03-08 NOTE — Progress Notes (Signed)
    [facility] BANDING PROCEDURE NOTE  Jonathan Grimes is a 68 y.o. male presenting today for consideration of hemorrhoid banding. Last colonoscopy  May 2025: non-bleeding external and internal hemorrhoids, moderate diverticulosis in sigmoid and descending, redundant colon, otherwise normal. We have banded left lateral column thus far. He has noted bleeding but improved. Yeast perianal is improving.    The patient presents with symptomatic grade 2 hemorrhoids, unresponsive to maximal medical therapy, requesting rubber band ligation of his hemorrhoidal disease. All risks, benefits, and alternative forms of therapy were described and informed consent was obtained.  The decision was made to band the right posterior internal hemorrhoid, and the [facility] O'Regan System was used to perform band ligation without complication. Digital anorectal examination was then performed to assure proper positioning of the band, and to adjust the banded tissue as required. The patient was discharged home without pain or other issues. Dietary and behavioral recommendations were given, along with follow-up instructions. The patient will return in several weeks for followup and possible additional banding as required.  No complications were encountered and the patient tolerated the procedure well.   Therisa MICAEL Stager, PhD, ANP-BC [facility] Gastroenter[facility]

## 2024-03-08 NOTE — Patient Instructions (Signed)

## 2024-03-19 DIAGNOSIS — M1991 Primary osteoarthritis, unspecified site: Secondary | ICD-10-CM | POA: Diagnosis not present

## 2024-03-19 DIAGNOSIS — L409 Psoriasis, unspecified: Secondary | ICD-10-CM | POA: Diagnosis not present

## 2024-03-19 DIAGNOSIS — R7989 Other specified abnormal findings of blood chemistry: Secondary | ICD-10-CM | POA: Diagnosis not present

## 2024-03-19 DIAGNOSIS — R768 Other specified abnormal immunological findings in serum: Secondary | ICD-10-CM | POA: Diagnosis not present

## 2024-03-19 DIAGNOSIS — E669 Obesity, unspecified: Secondary | ICD-10-CM | POA: Diagnosis not present

## 2024-03-19 DIAGNOSIS — L4059 Other psoriatic arthropathy: Secondary | ICD-10-CM | POA: Diagnosis not present

## 2024-03-19 DIAGNOSIS — Z79899 Other long term (current) drug therapy: Secondary | ICD-10-CM | POA: Diagnosis not present

## 2024-03-19 DIAGNOSIS — Z6834 Body mass index (BMI) 34.0-34.9, adult: Secondary | ICD-10-CM | POA: Diagnosis not present

## 2024-03-19 DIAGNOSIS — Z111 Encounter for screening for respiratory tuberculosis: Secondary | ICD-10-CM | POA: Diagnosis not present

## 2024-03-19 DIAGNOSIS — M1A09X0 Idiopathic chronic gout, multiple sites, without tophus (tophi): Secondary | ICD-10-CM | POA: Diagnosis not present

## 2024-03-27 ENCOUNTER — Ambulatory Visit: Admitting: Gastroenterology

## 2024-03-27 VITALS — BP 107/68 | HR 83 | Temp 98.5°F | Ht 73.0 in | Wt 261.8 lb

## 2024-03-27 DIAGNOSIS — K641 Second degree hemorrhoids: Secondary | ICD-10-CM

## 2024-03-27 NOTE — Progress Notes (Signed)
    [facility] BANDING PROCEDURE NOTE  Phat Dalton is a 68 y.o. male presenting today for consideration of hemorrhoid banding. Last colonoscopy May 2025: non-bleeding external and internal hemorrhoids, moderate diverticulosis in sigmoid and descending, redundant colon, otherwise normal.  We have banded the left lateral column and the right posterior column thus far.  He was last seen in July 2025.  His symptoms of rectal bleeding had greatly improved at last visit.  He has also been treated for a perianal yeast that has also been improving with provided cream. Still having itching and some bleeding.    The patient presents with symptomatic grade 2 hemorrhoids, unresponsive to maximal medical therapy, requesting rubber band ligation of his hemorrhoidal disease. All risks, benefits, and alternative forms of therapy were described and informed consent was obtained.   The decision was made to band the right anterior internal hemorrhoid, and the [facility] O'Regan System was used to perform band ligation without complication. Digital anorectal examination was then performed to assure proper positioning of the band, and to adjust the banded tissue as required. The patient was discharged home without pain or other issues. Dietary and behavioral recommendations were given, along with follow-up instructions. The patient will return in several weeks for followup and possible additional banding as required. He will likely benefit from neutral banding.   No complications were encountered and the patient tolerated the procedure well.   Therisa MICAEL Stager, PhD, ANP-BC [facility]

## 2024-03-27 NOTE — Patient Instructions (Signed)
  Please avoid straining.  You should limit your toilet time to 2-3 minutes at the most.   I recommend Benefiber 2 teaspoons each morning in the beverage of your choice!  Please call me with any concerns or issues!  I will see you in follow-up for possible additional banding in about 4 weeks!  Happy early birthday!  I enjoyed seeing you again today! I value our relationship and want to provide genuine, compassionate, and quality care. You may receive a survey regarding your visit with me, and I welcome your feedback! Thanks so much for taking the time to complete this. I look forward to seeing you again.      Therisa MICAEL Stager, PhD, ANP-BC [facility]

## 2024-04-05 DIAGNOSIS — D518 Other vitamin B12 deficiency anemias: Secondary | ICD-10-CM | POA: Diagnosis not present

## 2024-04-24 ENCOUNTER — Ambulatory Visit: Admitting: Gastroenterology

## 2024-04-24 VITALS — BP 134/82 | HR 84 | Temp 98.4°F | Ht 73.0 in | Wt 260.8 lb

## 2024-04-24 DIAGNOSIS — K641 Second degree hemorrhoids: Secondary | ICD-10-CM | POA: Diagnosis not present

## 2024-04-24 DIAGNOSIS — K921 Melena: Secondary | ICD-10-CM

## 2024-04-24 NOTE — Patient Instructions (Signed)
  Please avoid straining.  You should limit your toilet time to 2-3 minutes at the most.   I recommend Benefiber 2 teaspoons each morning in the beverage of your choice!  Please call me with any concerns or issues!  I have ordered a blood count to recheck your hemoglobin.  We will see you in 6 weeks!  I enjoyed seeing you again today! I value our relationship and want to provide genuine, compassionate, and quality care. You may receive a survey regarding your visit with me, and I welcome your feedback! Thanks so much for taking the time to complete this. I look forward to seeing you again.      Therisa MICAEL Stager, PhD, ANP-BC [facility]

## 2024-04-24 NOTE — Progress Notes (Signed)
    [facility] BANDING PROCEDURE NOTE  Jonathan Grimes is a 68 y.o. male presenting today for consideration of hemorrhoid banding. Last colonoscopy  May 2025: non-bleeding external and internal hemorrhoids, moderate diverticulosis in sigmoid and descending, redundant colon, otherwise normal.  We have banded left lateral, right posterior, and right anterior. He presents today for likely neutral banding. He has also been dealing with perianal yeast that is improving with cream provided.    The patient presents with symptomatic grade 2 hemorrhoids, unresponsive to maximal medical therapy, requesting rubber band ligation of his hemorrhoidal disease. All risks, benefits, and alternative forms of therapy were described and informed consent was obtained.    The decision was made to band neutrally,  and the [facility] System was used to perform band ligation without complication. Digital anorectal examination was then performed to assure proper positioning of the band, and to adjust the banded tissue as required. The column banded is left lateral and more distal than prior bandings, with a large amount of tissue. Hopefully, this will be the last banding he requires. The patient was discharged home without pain or other issues. Dietary and behavioral recommendations were given, along with follow-up instructions. Will update CBC today due to hx of IDA. Last Hgb 10 around May 2025. May benefit from Hematology consultation.   No complications were encountered and the patient tolerated the procedure well.   Therisa MICAEL Stager, PhD, ANP-BC [facility]

## 2024-05-03 ENCOUNTER — Telehealth: Payer: Self-pay | Admitting: Neurology

## 2024-05-03 NOTE — Telephone Encounter (Signed)
 Patient cancelled appointment due to other medical issues. Will call back to reschedule

## 2024-05-07 ENCOUNTER — Ambulatory Visit: Admitting: Orthopaedic Surgery

## 2024-05-07 ENCOUNTER — Other Ambulatory Visit (INDEPENDENT_AMBULATORY_CARE_PROVIDER_SITE_OTHER): Payer: Self-pay

## 2024-05-07 DIAGNOSIS — Z96651 Presence of right artificial knee joint: Secondary | ICD-10-CM

## 2024-05-07 DIAGNOSIS — M79641 Pain in right hand: Secondary | ICD-10-CM

## 2024-05-07 MED ORDER — TRAMADOL HCL 50 MG PO TABS: 50.0000 mg | ORAL_TABLET | Freq: Four times a day (QID) | ORAL | 0 refills | Status: DC | PRN

## 2024-05-07 NOTE — Progress Notes (Signed)
 The patient is well-known to us .  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  We revise the tibial component.  He comes in today ambit with a walker after mechanical fall about 3 weeks ago injuring his right knee and his right wrist.  He says both of these things are hurting him quite a bit.  Examination of his right wrist does show some swelling globally about the wrist and dorsum of the hand with painful flexion extension of the wrist as well as supination and pronation.  3 views of the right wrist and hand show no obvious fracture but degenerative changes in several areas.  Examination of his right knee does show a moderate effusion.  His extensor mechanism is intact and he can flex and extend the knee.  2 views of the right knee show a longstem tibial revision arthroplasty with no acute findings.  I did aspirate fluid from the knee and it looked consistent almost with gout.  He has not had any fever or chills.  He does have a history of gout.  I will try some tramadol  for him for pain and would like to see him back in 6 weeks for repeat exam but no x-rays are needed.  We will have him also use a Velcro wrist splint as needed for his right wrist.

## 2024-05-09 ENCOUNTER — Ambulatory Visit: Admitting: Neurology

## 2024-05-16 ENCOUNTER — Ambulatory Visit: Admitting: Orthopaedic Surgery

## 2024-06-04 ENCOUNTER — Encounter (INDEPENDENT_AMBULATORY_CARE_PROVIDER_SITE_OTHER): Payer: Self-pay

## 2024-06-07 ENCOUNTER — Encounter: Payer: Self-pay | Admitting: *Deleted

## 2024-06-07 ENCOUNTER — Encounter: Payer: Self-pay | Admitting: Gastroenterology

## 2024-06-07 ENCOUNTER — Ambulatory Visit: Admitting: Gastroenterology

## 2024-06-07 VITALS — BP 118/66 | HR 87 | Temp 97.7°F | Ht 73.0 in | Wt 257.0 lb

## 2024-06-07 DIAGNOSIS — D509 Iron deficiency anemia, unspecified: Secondary | ICD-10-CM | POA: Diagnosis not present

## 2024-06-07 DIAGNOSIS — E538 Deficiency of other specified B group vitamins: Secondary | ICD-10-CM

## 2024-06-07 DIAGNOSIS — R7989 Other specified abnormal findings of blood chemistry: Secondary | ICD-10-CM | POA: Diagnosis not present

## 2024-06-07 DIAGNOSIS — R011 Cardiac murmur, unspecified: Secondary | ICD-10-CM | POA: Diagnosis not present

## 2024-06-07 MED ORDER — NYSTATIN-TRIAMCINOLONE 100000-0.1 UNIT/GM-% EX OINT: 1.0000 | TOPICAL_OINTMENT | Freq: Two times a day (BID) | CUTANEOUS | 1 refills | Status: DC

## 2024-06-07 NOTE — Patient Instructions (Signed)
 I have ordered labs to have done when you see your primary care again!  I have also ordered an ultrasound of your liver and an echocardiogram of your heart.  Please go to the emergency room if shortness of breath at rest, chest pain, any worsening shortness of breath with exertion.  I have refilled the cream for you.  We will see you in 2 -3 months!  I enjoyed seeing you again today! I value our relationship and want to provide genuine, compassionate, and quality care. You may receive a survey regarding your visit with me, and I welcome your feedback! Thanks so much for taking the time to complete this. I look forward to seeing you again.      Therisa MICAEL Stager, PhD, ANP-BC [facility]

## 2024-06-07 NOTE — Progress Notes (Signed)
 Gastroenterology Office Note     Primary Care Physician:  Marvine Rush, MD  Primary Gastroenterologist: Dr. Shaaron    Chief Complaint   Chief Complaint  Patient presents with   Follow-up    Doing well     History of Present Illness   Jonathan Grimes is a 69 y.o. male presenting today with a history of symptomatic hemorrhoids, s/p banding of all 3 columns this year and neutral banding, history of long-standing IDA,  returns for routine follow-up, with last banding in Aug 2025.   IDA: Hgb 10.2 in May 2025, 10.4 in July, MCV 106, platelets 168. Upcoming labs in November with PCP. Celiac labs negative in past. Feels fatigued, chronic DOE. Feels notably short of breath with moving from chair to exam table. SOB with walking. No chest pain.    May 2025: AST 47, ALT 28, Alk Phos 113  July Hgb 10.4, MCG 106, Platelets 168, Alkh phos 176, AST 95, ALT 41, tbili 0.3  FH CAD/MI in mother and father   States perianal rash, felt likely due to yeast, is much better. Uses cream only 3 times a week. Used to have to use the cream daily. No rectal pain or itching. No abdominal pain.   Notes dark stool on iron. Taking iron BID. No solid food dysphagia. GERD controlled on omeprazole 40 mg daily.   Drinks alcohol about 12 ounces twice a week.  No smoking   Last colonoscopy May 2025: non-bleeding external and internal hemorrhoids, moderate diverticulosis in sigmoid and descending, redundant colon, otherwise normal   EGD 2017: MFM:jwumjo erosiona s/p bx (no h.pylri, gastric mucosa with intestinal metaplasia)   Givens 2017: scattered erosions in in stomach, small bowel unremrakable.   Past Medical History:  Diagnosis Date   Arthritis    SEVERE OA LEFT HIP WITH PAIN   Diabetes mellitus without complication (HCC)    type 2   Elevated cholesterol    Fatty liver    GERD (gastroesophageal reflux disease)    H/O hiatal hernia    Hiatal hernia    Hypertension    Schatzki's ring    Sleep  apnea    Tubular adenoma     Past Surgical History:  Procedure Laterality Date   BIOPSY N/A 10/02/2015   Procedure: BIOPSY;  Surgeon: Lamar CHRISTELLA Shaaron, MD;  [facility];  Service: Endoscopy;  Laterality: N/A;  Gastric bx   COLONOSCOPY  01/19/10   Dr.Rourk- friable anorectal. o/w normal rectum, L sided diverticula, sigmoid polyp bx= tubular adenoma,   COLONOSCOPY N/A 10/02/2015   MFM:wnmfjo appearing rectal/scattered sigmoid diverticula, next TCS five years for h/o colon adenomas   COLONOSCOPY N/A 01/04/2024   Procedure: COLONOSCOPY;  Surgeon: Shaaron Lamar CHRISTELLA, MD;  [facility];  Service: Endoscopy;  Laterality: N/A;  730AM, ASA 3   COLONOSCOPY WITH PROPOFOL  N/A 01/04/2022   Procedure: COLONOSCOPY WITH PROPOFOL ;  Surgeon: Shaaron Lamar CHRISTELLA, MD;  [facility];  Service: Endoscopy;  Laterality: N/A;  7:30 / ASA 2   ESOPHAGOGASTRODUODENOSCOPY  01/19/10   Dr.Rourk- noncritical schatzki's ring, o/w normal esophagus, small hiatal hernia o/w normal stomach D1, D2   ESOPHAGOGASTRODUODENOSCOPY N/A 10/02/2015   MFM:jwumjo erosiona s/p bx (no h.pylri, gastric mucosa with intestinal metaplasia)   GIVENS CAPSULE STUDY N/A 11/21/2015   Procedure: GIVENS CAPSULE STUDY;  Surgeon: Lamar CHRISTELLA Shaaron, MD;  [facility];  Service: Endoscopy;  Laterality: N/A;  0700   JOINT REPLACEMENT  2012  RIGHT TOTAL KNEE ARTHROPLASTY   TOTAL HIP ARTHROPLASTY  07/28/2012   Procedure: TOTAL HIP ARTHROPLASTY ANTERIOR APPROACH;  Surgeon: Lonni CINDERELLA Poli, MD;  [facility]: [facility];  Service: Orthopedics;  Laterality: Left;   TOTAL HIP ARTHROPLASTY Right 02/22/2014   Procedure: RIGHT TOTAL HIP ARTHROPLASTY ANTERIOR APPROACH;  Surgeon: Lonni CINDERELLA Poli, MD;  [facility]: [facility];  Service: Orthopedics;  Laterality: Right;   TOTAL KNEE REVISION Right 07/11/2015   Procedure: REVISION RIGHT TOTAL KNEE ARTHROPLASTYone component only;  Surgeon: Lonni CINDERELLA Poli, MD;  [facility];  Service:  Orthopedics;  Laterality: Right;    Current Outpatient Medications  Medication Sig Dispense Refill   allopurinol (ZYLOPRIM) 100 MG tablet Take 100 mg by mouth daily.     amLODipine (NORVASC) 10 MG tablet Take 10 mg by mouth daily.     aspirin  EC 81 MG tablet Take 81 mg by mouth daily.     atorvastatin  (LIPITOR) 10 MG tablet 1 tablet Orally Once a day for 30 day(s)     diclofenac (VOLTAREN) 75 MG EC tablet Take 75 mg by mouth 2 (two) times daily.     ENBREL SURECLICK 50 MG/ML injection once a week.     escitalopram (LEXAPRO) 10 MG tablet Take 10 mg by mouth daily.     ferrous sulfate  325 (65 FE) MG tablet Take 325 mg by mouth 2 (two) times daily.     levothyroxine (SYNTHROID) 25 MCG tablet daily at 6 (six) AM.     losartan  (COZAAR ) 100 MG tablet Take 100 mg by mouth daily.     metFORMIN  (GLUCOPHAGE ) 1000 MG tablet Take 1,000 mg by mouth 2 (two) times daily.     nystatin -triamcinolone  ointment (MYCOLOG) Apply 1 Application topically 2 (two) times daily. Between buttocks 30 g 1   omeprazole (PRILOSEC) 40 MG capsule Take 40 mg by mouth daily.     traMADol  (ULTRAM ) 50 MG tablet Take 1-2 tablets (50-100 mg total) by mouth every 6 (six) hours as needed. 30 tablet 0   No current facility-administered medications for this visit.    Allergies as of 06/07/2024   (No Known Allergies)    No family history on file.  Social History   Socioeconomic History   Marital status: Married    Spouse name: Not on file   Number of children: Not on file   Years of education: Not on file   Highest education level: Not on file  Occupational History   Not on file  Tobacco Use   Smoking status: Former    Current packs/day: 0.00    Types: Cigarettes    Quit date: 01/06/1982    Years since quitting: 42.4   Smokeless tobacco: Current    Types: Chew   Tobacco comments:    QUIT SMOKING 34 YRS AGO  QUIT CHEWING TOBACCO JAN 2012  Substance and Sexual Activity   Alcohol use: Yes    Comment: beer  occasionally   Drug use: No   Sexual activity: Not on file  Other Topics Concern   Not on file  Social History Narrative   Not on file   Social Drivers of Health   Financial Resource Strain: Not on file  Food Insecurity: Not on file  Transportation Needs: Not on file  Physical Activity: Not on file  Stress: Not on file  Social Connections: Not on file  Intimate Partner Violence: Not on file     Review of Systems   Gen: Denies any fever, chills, fatigue, weight loss,  lack of appetite.  CV: Denies chest pain, heart palpitations, peripheral edema, syncope.  Resp: Denies shortness of breath at rest or with exertion. Denies wheezing or cough.  GI: Denies dysphagia or odynophagia. Denies jaundice, hematemesis, fecal incontinence. GU : Denies urinary burning, urinary frequency, urinary hesitancy MS: Denies joint pain, muscle weakness, cramps, or limitation of movement.  Derm: Denies rash, itching, dry skin Psych: Denies depression, anxiety, memory loss, and confusion Heme: Denies bruising, bleeding, and enlarged lymph nodes.   Physical Exam   BP 118/66 (BP Location: Right Arm, Patient Position: Sitting, Cuff Size: Large)   Pulse 87   Temp 97.7 F (36.5 C) (Oral)   Ht 6' 1 (1.854 m)   Wt 257 lb (116.6 kg)   SpO2 94%   BMI 33.91 kg/m  General:   Alert and oriented. Pleasant and cooperative. Winded on exertion, worsened with laying down. Chronic, no chest pain Head:  Normocephalic and atraumatic. Eyes:  Without icterus Cardiac: systolic murmur Lungs: diminished bases, no wheezing,  Abdomen:  +BS, soft, obese, no obvious HSM, moderate sized umbilical hernia Rectal:  Deferred  Msk:  Symmetrical without gross deformities. Normal posture. Extremities:  Without edema. Neurologic:  Alert and  oriented x4;  grossly normal neurologically. Skin:  Intact without significant lesions or rashes. Psych:  Alert and cooperative. Normal mood and affect.   Assessment   Jonathan Grimes is a 68 y.o. male presenting today with a history of symptomatic hemorrhoids, s/p banding of all 3 columns this year and neutral banding, history of long-standing IDA,  returns for routine follow-up, with last banding in Aug 2025.   IDA: update CBC, iron studies, B12 and folate panel. Celiac labs negative in past. Prior evaluation as above but if worsening or persistent IDA needs EGD and possibly capsule to follow. Oral iron likely culprit for darker stool.     IDA: Hgb 10.2 in May 2025, 10.4 in July, MCV 106, platelets 168. Upcoming labs in November with PCP. Celiac labs negative in past. Feels fatigued, chronic DOE. Feels notably short of breath with moving from chair to exam table. SOB with walking. No chest pain.     Chronically elevated transaminases: AST predmonianty, avoid alcohol. Update HFP. US  abdomen complete.   Fatigue, significant DOE: notes FH CAD in both parents and MI both parents. ECHO ordered due to murmur. Referral to cardiology due to multiple personal risk factors for CAD and FH.     PLAN    Iron studies, B12/folate, anti-parietal labs, HFP with upcoming PCP labs US  abdomen complete, may need further labs depending on LFTs ECHO Ancticipate need for EGD Cardiology referral and would need clearance prior to EGD 2-3 month follow-up   Therisa MICAEL Stager, PhD, ANP-BC [facility]

## 2024-06-12 ENCOUNTER — Ambulatory Visit (HOSPITAL_COMMUNITY)
Admission: RE | Admit: 2024-06-12 | Discharge: 2024-06-12 | Disposition: A | Discharge status: Home / Self Care | Source: Ambulatory Visit | Attending: Gastroenterology | Admitting: Gastroenterology

## 2024-06-12 DIAGNOSIS — R7989 Other specified abnormal findings of blood chemistry: Secondary | ICD-10-CM | POA: Insufficient documentation

## 2024-06-15 ENCOUNTER — Ambulatory Visit: Payer: Self-pay | Admitting: Gastroenterology

## 2024-06-18 ENCOUNTER — Encounter: Payer: Self-pay | Admitting: Orthopaedic Surgery

## 2024-06-18 ENCOUNTER — Ambulatory Visit: Admitting: Orthopaedic Surgery

## 2024-06-18 DIAGNOSIS — Z96651 Presence of right artificial knee joint: Secondary | ICD-10-CM

## 2024-06-18 DIAGNOSIS — M25561 Pain in right knee: Secondary | ICD-10-CM

## 2024-06-18 NOTE — Progress Notes (Signed)
 The patient is send we saw about 6 weeks ago after hard mechanical fall injuring his right wrist and his right revision knee.  We did pull a lot of fluid off of that knee and his knee is feeling better and his wrist is feeling a bit better.  He says right now the arthritis in his ankle is bothersome.  He does take Tylenol  and diclofenac.  He is a diabetic and so we try to avoid steroids on him.  Overall though he is mobilizing better.  He does ambulate with a rolling walker.  He reports improved range of motion and strength with the right knee.  On examination of the right knee today he does not have any significant swelling at all.  He is moving better overall.  At this point follow-up can be as needed since he is improving.  If things worsen in any way they know to reach out and let us  know.  He knows to be careful and still ambulate with a walker given his mobility and balance issues.

## 2024-07-02 ENCOUNTER — Encounter: Payer: Self-pay | Admitting: Radiology

## 2024-07-03 LAB — LAB REPORT - SCANNED: TSH: 4.72

## 2024-07-05 ENCOUNTER — Ambulatory Visit (HOSPITAL_COMMUNITY)
Admission: RE | Admit: 2024-07-05 | Discharge: 2024-07-05 | Disposition: A | Discharge status: Home / Self Care | Source: Ambulatory Visit | Attending: Family Medicine | Admitting: Family Medicine

## 2024-07-05 DIAGNOSIS — R011 Cardiac murmur, unspecified: Secondary | ICD-10-CM | POA: Insufficient documentation

## 2024-07-05 LAB — ECHOCARDIOGRAM COMPLETE
AR max vel: 1.49 cm2
AV Area VTI: 1.66 cm2
AV Area mean vel: 1.52 cm2
AV Mean grad: 20.7 mmHg
AV Peak grad: 35 mmHg
Ao pk vel: 2.96 m/s
Area-P 1/2: 4.8 cm2
S' Lateral: 2.9 cm

## 2024-07-05 NOTE — Progress Notes (Signed)
*  PRELIMINARY RESULTS* Echocardiogram 2D Echocardiogram has been performed.  Jonathan Grimes 07/05/2024, 9:07 AM

## 2024-07-10 ENCOUNTER — Encounter (INDEPENDENT_AMBULATORY_CARE_PROVIDER_SITE_OTHER): Payer: Self-pay | Admitting: Physician Assistant

## 2024-07-10 ENCOUNTER — Ambulatory Visit (INDEPENDENT_AMBULATORY_CARE_PROVIDER_SITE_OTHER): Admitting: Physician Assistant

## 2024-07-10 VITALS — BP 126/77 | HR 88 | Temp 97.8°F | Ht 73.0 in | Wt 240.0 lb

## 2024-07-10 DIAGNOSIS — H9192 Unspecified hearing loss, left ear: Secondary | ICD-10-CM | POA: Diagnosis not present

## 2024-07-10 DIAGNOSIS — H9312 Tinnitus, left ear: Secondary | ICD-10-CM | POA: Diagnosis not present

## 2024-07-10 DIAGNOSIS — R42 Dizziness and giddiness: Secondary | ICD-10-CM | POA: Diagnosis not present

## 2024-07-10 DIAGNOSIS — J309 Allergic rhinitis, unspecified: Secondary | ICD-10-CM | POA: Diagnosis not present

## 2024-07-10 MED ORDER — LORATADINE 10 MG PO TABS: 10.0000 mg | ORAL_TABLET | Freq: Every day | ORAL | 11 refills | Status: AC

## 2024-07-10 MED ORDER — FLUTICASONE PROPIONATE 50 MCG/ACT NA SUSP: 2.0000 | Freq: Every day | NASAL | 6 refills | Status: AC

## 2024-07-10 NOTE — Progress Notes (Signed)
 Dear Dr. Marvine, Here is my assessment for our mutual patient, Jonathan Grimes. Thank you for allowing me the opportunity to care for your patient. Please do not hesitate to contact me should you have any other questions. Sincerely, Chyrl Cohen PA-C  Otolaryngology Clinic Note Referring provider: Dr. Marvine HPI:  Jonathan Grimes is a 68 y.o. male kindly referred by Dr. Marvine   Discussed the use of AI scribe software for clinical note transcription with the patient, who gave verbal consent to proceed.  History of Present Illness    Jonathan Grimes is a 68 year old male who presents with decreased hearing in the left ear and dizziness. He was referred by Dr. Marvine for evaluation of decreased hearing.  He has experienced a decrease in hearing in his left ear for the past six to seven months. He reports decreased hearing in the left ear with no associated pain or drainage. He experiences a constant ringing in the left ear, which sometimes sounds like 'somebody knocking on a door' when he turns his head. No ringing in the right ear, drainage, infection, or loud noises in the left ear when dizzy.  Dizziness began around the same time as the hearing loss, approximately six to seven months ago. The dizziness is described as a sensation of spinning, particularly when he moves his head side to side, and it improves when he stops moving. The dizziness is severe enough to cause falls. No dizziness while sitting or any history of similar episodes prior to this period, except when his blood sugar was uncontrolled due to diabetes. No numbness, tingling, weakness, or stroke-like symptoms associated with the dizziness. No slurred speech or history of allergies causing significant congestion recently.  He has a history of diabetes, which is currently well-controlled. He takes Zyrtec for allergies but does not use nasal sprays. No history of repeat ear infections or recent congestion.            Independent Review of Additional Tests or Records:  none   PMH/Meds/All/SocHx/FamHx/ROS:   Past Medical History:  Diagnosis Date   Arthritis    SEVERE OA LEFT HIP WITH PAIN   Diabetes mellitus without complication (HCC)    type 2   Elevated cholesterol    Fatty liver    GERD (gastroesophageal reflux disease)    H/O hiatal hernia    Hiatal hernia    Hypertension    Schatzki's ring    Sleep apnea    Tubular adenoma      Past Surgical History:  Procedure Laterality Date   BIOPSY N/A 10/02/2015   Procedure: BIOPSY;  Surgeon: Lamar CHRISTELLA Hollingshead, MD;  [facility];  Service: Endoscopy;  Laterality: N/A;  Gastric bx   COLONOSCOPY  01/19/2010   Dr.Rourk- friable anorectal. o/w normal rectum, L sided diverticula, sigmoid polyp bx= tubular adenoma,   COLONOSCOPY N/A 10/02/2015   MFM:wnmfjo appearing rectal/scattered sigmoid diverticula, next TCS five years for h/o colon adenomas   COLONOSCOPY N/A 01/04/2024   Procedure: COLONOSCOPY;  Surgeon: Hollingshead Lamar CHRISTELLA, MD;  [facility];  Service: Endoscopy;  Laterality: N/A;  730AM, ASA 3   COLONOSCOPY WITH PROPOFOL  N/A 01/04/2022   Procedure: COLONOSCOPY WITH PROPOFOL ;  Surgeon: Hollingshead Lamar CHRISTELLA, MD;  [facility];  Service: Endoscopy;  Laterality: N/A;  7:30 / ASA 2   ESOPHAGOGASTRODUODENOSCOPY  01/19/2010   Dr.Rourk- noncritical schatzki's ring, o/w normal esophagus, small hiatal hernia o/w normal stomach D1, D2   ESOPHAGOGASTRODUODENOSCOPY N/A 10/02/2015  MFM:jwumjo erosiona s/p bx (no h.pylri, gastric mucosa with intestinal metaplasia)   GIVENS CAPSULE STUDY N/A 11/21/2015   Procedure: GIVENS CAPSULE STUDY;  Surgeon: Lamar CHRISTELLA Hollingshead, MD;  [facility];  Service: Endoscopy;  Laterality: N/A;  0700   JOINT REPLACEMENT  08/30/2010   RIGHT TOTAL KNEE ARTHROPLASTY   TOTAL HIP ARTHROPLASTY  07/28/2012   Procedure: TOTAL HIP ARTHROPLASTY ANTERIOR APPROACH;  Surgeon: Lonni CINDERELLA Poli, MD;   [facility];  Service: Orthopedics;  Laterality: Left;   TOTAL HIP ARTHROPLASTY Right 02/22/2014   Procedure: RIGHT TOTAL HIP ARTHROPLASTY ANTERIOR APPROACH;  Surgeon: Lonni CINDERELLA Poli, MD;  [facility];  Service: Orthopedics;  Laterality: Right;   TOTAL KNEE REVISION Right 07/11/2015   Procedure: REVISION RIGHT TOTAL KNEE ARTHROPLASTYone component only;  Surgeon: Lonni CINDERELLA Poli, MD;  [facility];  Service: Orthopedics;  Laterality: Right;    History reviewed. No pertinent family history.   Social Connections: Not on file      Current Outpatient Medications:    allopurinol (ZYLOPRIM) 100 MG tablet, Take 100 mg by mouth daily., Disp: , Rfl:    amLODipine (NORVASC) 10 MG tablet, Take 10 mg by mouth daily., Disp: , Rfl:    aspirin  EC 81 MG tablet, Take 81 mg by mouth daily., Disp: , Rfl:    atorvastatin  (LIPITOR) 10 MG tablet, 1 tablet Orally Once a day for 30 day(s), Disp: , Rfl:    diclofenac (VOLTAREN) 75 MG EC tablet, Take 75 mg by mouth 2 (two) times daily., Disp: , Rfl:    ENBREL SURECLICK 50 MG/ML injection, once a week., Disp: , Rfl:    escitalopram (LEXAPRO) 10 MG tablet, Take 10 mg by mouth daily., Disp: , Rfl:    ferrous sulfate  325 (65 FE) MG tablet, Take 325 mg by mouth 2 (two) times daily., Disp: , Rfl:    fluticasone (FLONASE) 50 MCG/ACT nasal spray, Place 2 sprays into both nostrils daily., Disp: 16 g, Rfl: 6   levothyroxine (SYNTHROID) 50 MCG tablet, Take 50 mcg by mouth daily., Disp: , Rfl:    loratadine (CLARITIN) 10 MG tablet, Take 1 tablet (10 mg total) by mouth daily., Disp: 90 tablet, Rfl: 11   losartan  (COZAAR ) 100 MG tablet, Take 100 mg by mouth daily., Disp: , Rfl:    metFORMIN  (GLUCOPHAGE ) 1000 MG tablet, Take 1,000 mg by mouth 2 (two) times daily., Disp: , Rfl:    nystatin -triamcinolone  ointment (MYCOLOG), Apply 1 Application topically 2 (two) times daily. Between buttocks, Disp: 30 g, Rfl: 1   omeprazole (PRILOSEC) 40 MG capsule, Take  40 mg by mouth daily., Disp: , Rfl:    traMADol  (ULTRAM ) 50 MG tablet, Take 1-2 tablets (50-100 mg total) by mouth every 6 (six) hours as needed., Disp: 30 tablet, Rfl: 0   levothyroxine (SYNTHROID) 25 MCG tablet, daily at 6 (six) AM. (Patient not taking: Reported on 07/10/2024), Disp: , Rfl:    Physical Exam:   BP 126/77   Pulse 88   Temp 97.8 F (36.6 C)   Ht 6' 1 (1.854 m)   Wt 240 lb (108.9 kg)   SpO2 93%   BMI 31.66 kg/m   Pertinent Findings  CN II-XII grossly intact- no nystagmus  Bilateral EAC clear and TM intact with well pneumatized middle ear spaces Weber 512: equal Rinne 512: AC > BC b/l  Anterior rhinoscopy: Septum right deviation; bilateral inferior turbinates with mild hypertrophy  No lesions of oral cavity/oropharynx; dentition wnl No obviously  palpable neck masses/lymphadenopathy/thyromegaly No respiratory distress or stridor  Seprately Identifiable Procedures:  None  Impression & Plans:  Jonathan Grimes is a 68 y.o. 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   - Ordered hearing test to assess hearing loss and potential ear-related causes of dizziness. - Prescribed Flonase nasal spray to reduce fluid or congestion. - Continue daily antihistamine such as Claritin. - Advised saline nasal irrigation to reduce inflammation and facilitate fluid drainage. - Will call with audio results   Allergic rhinitis Managed with Zyrtec. Potential contribution to ear symptoms due to fluid or congestion. - Continue Zyrtec for allergy management. - Use Flonase nasal spray to address potential nasal congestion. - Perform saline nasal irrigation to reduce inflammation.           - f/u phone call discussion with audiological results   Thank you for allowing me the opportunity to care for your patient. Please do not hesitate to contact me should  you have any other questions.  Sincerely, Chyrl Cohen PA-C [facility] Phone: [phone] Fax: [phone]  07/10/2024, 1:12 PM

## 2024-07-12 NOTE — Telephone Encounter (Signed)
 Mandy: please request recent labs from Dr. Bertha office.

## 2024-07-18 ENCOUNTER — Other Ambulatory Visit: Payer: Self-pay

## 2024-07-18 ENCOUNTER — Emergency Department (HOSPITAL_COMMUNITY)

## 2024-07-18 ENCOUNTER — Observation Stay (HOSPITAL_COMMUNITY)

## 2024-07-18 ENCOUNTER — Encounter: Payer: Self-pay | Admitting: Gastroenterology

## 2024-07-18 ENCOUNTER — Ambulatory Visit (INDEPENDENT_AMBULATORY_CARE_PROVIDER_SITE_OTHER): Admitting: Gastroenterology

## 2024-07-18 ENCOUNTER — Encounter (HOSPITAL_COMMUNITY): Payer: Self-pay | Admitting: Emergency Medicine

## 2024-07-18 ENCOUNTER — Inpatient Hospital Stay (HOSPITAL_COMMUNITY)
Admission: EM | Admit: 2024-07-18 | Discharge: 2024-07-20 | DRG: 811 | Disposition: A | Discharge status: Home / Self Care | Source: Ambulatory Visit | Attending: Internal Medicine | Admitting: Internal Medicine

## 2024-07-18 VITALS — BP 118/77 | HR 96 | Temp 97.7°F | Ht 73.0 in | Wt 240.1 lb

## 2024-07-18 DIAGNOSIS — R0602 Shortness of breath: Secondary | ICD-10-CM | POA: Diagnosis not present

## 2024-07-18 DIAGNOSIS — G4733 Obstructive sleep apnea (adult) (pediatric): Secondary | ICD-10-CM | POA: Diagnosis present

## 2024-07-18 DIAGNOSIS — Z6831 Body mass index (BMI) 31.0-31.9, adult: Secondary | ICD-10-CM

## 2024-07-18 DIAGNOSIS — N179 Acute kidney failure, unspecified: Secondary | ICD-10-CM

## 2024-07-18 DIAGNOSIS — D509 Iron deficiency anemia, unspecified: Secondary | ICD-10-CM | POA: Diagnosis present

## 2024-07-18 DIAGNOSIS — Z96651 Presence of right artificial knee joint: Secondary | ICD-10-CM | POA: Diagnosis present

## 2024-07-18 DIAGNOSIS — R0609 Other forms of dyspnea: Secondary | ICD-10-CM | POA: Insufficient documentation

## 2024-07-18 DIAGNOSIS — Z79890 Hormone replacement therapy (postmenopausal): Secondary | ICD-10-CM

## 2024-07-18 DIAGNOSIS — F32A Depression, unspecified: Secondary | ICD-10-CM | POA: Diagnosis present

## 2024-07-18 DIAGNOSIS — Z7984 Long term (current) use of oral hypoglycemic drugs: Secondary | ICD-10-CM

## 2024-07-18 DIAGNOSIS — F419 Anxiety disorder, unspecified: Secondary | ICD-10-CM | POA: Diagnosis present

## 2024-07-18 DIAGNOSIS — R6881 Early satiety: Secondary | ICD-10-CM | POA: Diagnosis present

## 2024-07-18 DIAGNOSIS — K3189 Other diseases of stomach and duodenum: Secondary | ICD-10-CM | POA: Diagnosis present

## 2024-07-18 DIAGNOSIS — K648 Other hemorrhoids: Secondary | ICD-10-CM | POA: Diagnosis present

## 2024-07-18 DIAGNOSIS — R1310 Dysphagia, unspecified: Secondary | ICD-10-CM | POA: Diagnosis present

## 2024-07-18 DIAGNOSIS — I350 Nonrheumatic aortic (valve) stenosis: Secondary | ICD-10-CM | POA: Diagnosis present

## 2024-07-18 DIAGNOSIS — R634 Abnormal weight loss: Secondary | ICD-10-CM | POA: Diagnosis present

## 2024-07-18 DIAGNOSIS — I509 Heart failure, unspecified: Principal | ICD-10-CM

## 2024-07-18 DIAGNOSIS — L89322 Pressure ulcer of left buttock, stage 2: Secondary | ICD-10-CM | POA: Diagnosis present

## 2024-07-18 DIAGNOSIS — Z87891 Personal history of nicotine dependence: Secondary | ICD-10-CM

## 2024-07-18 DIAGNOSIS — K250 Acute gastric ulcer with hemorrhage: Secondary | ICD-10-CM

## 2024-07-18 DIAGNOSIS — E7800 Pure hypercholesterolemia, unspecified: Secondary | ICD-10-CM | POA: Diagnosis present

## 2024-07-18 DIAGNOSIS — K219 Gastro-esophageal reflux disease without esophagitis: Secondary | ICD-10-CM | POA: Diagnosis present

## 2024-07-18 DIAGNOSIS — K921 Melena: Secondary | ICD-10-CM | POA: Diagnosis present

## 2024-07-18 DIAGNOSIS — K2970 Gastritis, unspecified, without bleeding: Secondary | ICD-10-CM

## 2024-07-18 DIAGNOSIS — E669 Obesity, unspecified: Secondary | ICD-10-CM | POA: Diagnosis present

## 2024-07-18 DIAGNOSIS — D649 Anemia, unspecified: Secondary | ICD-10-CM

## 2024-07-18 DIAGNOSIS — D62 Acute posthemorrhagic anemia: Secondary | ICD-10-CM | POA: Diagnosis present

## 2024-07-18 DIAGNOSIS — I110 Hypertensive heart disease with heart failure: Secondary | ICD-10-CM | POA: Diagnosis present

## 2024-07-18 DIAGNOSIS — E119 Type 2 diabetes mellitus without complications: Secondary | ICD-10-CM | POA: Diagnosis present

## 2024-07-18 DIAGNOSIS — E872 Acidosis, unspecified: Secondary | ICD-10-CM | POA: Diagnosis present

## 2024-07-18 DIAGNOSIS — K644 Residual hemorrhoidal skin tags: Secondary | ICD-10-CM | POA: Diagnosis present

## 2024-07-18 DIAGNOSIS — L89312 Pressure ulcer of right buttock, stage 2: Secondary | ICD-10-CM | POA: Diagnosis present

## 2024-07-18 DIAGNOSIS — Z96641 Presence of right artificial hip joint: Secondary | ICD-10-CM | POA: Diagnosis present

## 2024-07-18 DIAGNOSIS — N170 Acute kidney failure with tubular necrosis: Secondary | ICD-10-CM | POA: Diagnosis present

## 2024-07-18 DIAGNOSIS — D500 Iron deficiency anemia secondary to blood loss (chronic): Secondary | ICD-10-CM

## 2024-07-18 DIAGNOSIS — I2510 Atherosclerotic heart disease of native coronary artery without angina pectoris: Secondary | ICD-10-CM | POA: Diagnosis present

## 2024-07-18 DIAGNOSIS — M109 Gout, unspecified: Secondary | ICD-10-CM | POA: Diagnosis present

## 2024-07-18 DIAGNOSIS — K2961 Other gastritis with bleeding: Principal | ICD-10-CM | POA: Diagnosis present

## 2024-07-18 DIAGNOSIS — Z7982 Long term (current) use of aspirin: Secondary | ICD-10-CM

## 2024-07-18 DIAGNOSIS — T454X5A Adverse effect of iron and its compounds, initial encounter: Secondary | ICD-10-CM | POA: Diagnosis present

## 2024-07-18 DIAGNOSIS — L4050 Arthropathic psoriasis, unspecified: Secondary | ICD-10-CM | POA: Diagnosis present

## 2024-07-18 DIAGNOSIS — I700 Atherosclerosis of aorta: Secondary | ICD-10-CM | POA: Diagnosis present

## 2024-07-18 DIAGNOSIS — J9811 Atelectasis: Secondary | ICD-10-CM | POA: Diagnosis present

## 2024-07-18 DIAGNOSIS — R54 Age-related physical debility: Secondary | ICD-10-CM | POA: Diagnosis present

## 2024-07-18 DIAGNOSIS — R0600 Dyspnea, unspecified: Secondary | ICD-10-CM | POA: Diagnosis present

## 2024-07-18 DIAGNOSIS — E039 Hypothyroidism, unspecified: Secondary | ICD-10-CM | POA: Diagnosis present

## 2024-07-18 DIAGNOSIS — Z79899 Other long term (current) drug therapy: Secondary | ICD-10-CM

## 2024-07-18 LAB — CBC WITH DIFFERENTIAL/PLATELET
Abs Immature Granulocytes: 0.05 K/uL (ref 0.00–0.07)
Basophils Absolute: 0 K/uL (ref 0.0–0.1)
Basophils Relative: 0 %
Eosinophils Absolute: 0.5 K/uL (ref 0.0–0.5)
Eosinophils Relative: 5 %
HCT: 25.8 % — ABNORMAL LOW (ref 39.0–52.0)
Hemoglobin: 8 g/dL — ABNORMAL LOW (ref 13.0–17.0)
Immature Granulocytes: 1 %
Lymphocytes Relative: 10 %
Lymphs Abs: 0.8 K/uL (ref 0.7–4.0)
MCH: 30.5 pg (ref 26.0–34.0)
MCHC: 31 g/dL (ref 30.0–36.0)
MCV: 98.5 fL (ref 80.0–100.0)
Monocytes Absolute: 0.8 K/uL (ref 0.1–1.0)
Monocytes Relative: 10 %
Neutro Abs: 6.1 K/uL (ref 1.7–7.7)
Neutrophils Relative %: 74 %
Platelets: 204 K/uL (ref 150–400)
RBC: 2.62 MIL/uL — ABNORMAL LOW (ref 4.22–5.81)
RDW: 14.3 % (ref 11.5–15.5)
WBC: 8.3 K/uL (ref 4.0–10.5)
nRBC: 0 % (ref 0.0–0.2)

## 2024-07-18 LAB — TROPONIN T, HIGH SENSITIVITY
Troponin T High Sensitivity: 71 ng/L — ABNORMAL HIGH (ref 0–19)
Troponin T High Sensitivity: 65 ng/L — ABNORMAL HIGH (ref 0–19)

## 2024-07-18 LAB — COMPREHENSIVE METABOLIC PANEL WITH GFR
ALT: 21 U/L (ref 0–44)
AST: 33 U/L (ref 15–41)
Albumin: 3.4 g/dL — ABNORMAL LOW (ref 3.5–5.0)
Alkaline Phosphatase: 251 U/L — ABNORMAL HIGH (ref 38–126)
Anion gap: 17 — ABNORMAL HIGH (ref 5–15)
BUN: 48 mg/dL — ABNORMAL HIGH (ref 8–23)
CO2: 17 mmol/L — ABNORMAL LOW (ref 22–32)
Calcium: 8.8 mg/dL — ABNORMAL LOW (ref 8.9–10.3)
Chloride: 103 mmol/L (ref 98–111)
Creatinine, Ser: 2.76 mg/dL — ABNORMAL HIGH (ref 0.61–1.24)
GFR, Estimated: 24 mL/min — ABNORMAL LOW (ref >60)
Glucose, Bld: 144 mg/dL — ABNORMAL HIGH (ref 70–99)
Potassium: 5 mmol/L (ref 3.5–5.1)
Sodium: 137 mmol/L (ref 135–145)
Total Bilirubin: 0.3 mg/dL (ref 0.0–1.2)
Total Protein: 7.3 g/dL (ref 6.5–8.1)

## 2024-07-18 LAB — PRO BRAIN NATRIURETIC PEPTIDE: Pro Brain Natriuretic Peptide: 1394 pg/mL — ABNORMAL HIGH (ref <300.0)

## 2024-07-18 MED ORDER — LACTATED RINGERS IV SOLN: INTRAVENOUS | Status: DC

## 2024-07-18 MED ORDER — ALLOPURINOL 100 MG PO TABS: 200.0000 mg | ORAL_TABLET | Freq: Every day | ORAL | Status: DC

## 2024-07-18 MED ORDER — FLUTICASONE PROPIONATE 50 MCG/ACT NA SUSP
2.0000 | Freq: Every day | NASAL | Status: DC
  Administered 2024-07-19 – 2024-07-20 (×2): 2 via NASAL
  Filled 2024-07-18: qty 16

## 2024-07-18 MED ORDER — ALLOPURINOL 100 MG PO TABS
100.0000 mg | ORAL_TABLET | Freq: Every day | ORAL | Status: DC
  Administered 2024-07-18 – 2024-07-20 (×3): 100 mg via ORAL
  Filled 2024-07-18 (×3): qty 1

## 2024-07-18 MED ORDER — ALBUTEROL SULFATE (2.5 MG/3ML) 0.083% IN NEBU: 2.5000 mg | INHALATION_SOLUTION | RESPIRATORY_TRACT | Status: DC | PRN

## 2024-07-18 MED ORDER — FUROSEMIDE 10 MG/ML IJ SOLN: 20.0000 mg | Freq: Once | INTRAMUSCULAR | Status: DC

## 2024-07-18 MED ORDER — LACTATED RINGERS IV BOLUS
1000.0000 mL | Freq: Once | INTRAVENOUS | Status: AC
  Administered 2024-07-18: 1000 mL via INTRAVENOUS

## 2024-07-18 MED ORDER — LORATADINE 10 MG PO TABS
10.0000 mg | ORAL_TABLET | Freq: Every day | ORAL | Status: DC
  Administered 2024-07-19 – 2024-07-20 (×2): 10 mg via ORAL
  Filled 2024-07-18 (×2): qty 1

## 2024-07-18 MED ORDER — ESCITALOPRAM OXALATE 10 MG PO TABS
10.0000 mg | ORAL_TABLET | Freq: Every day | ORAL | Status: DC
  Administered 2024-07-18 – 2024-07-20 (×3): 10 mg via ORAL
  Filled 2024-07-18 (×3): qty 1

## 2024-07-18 MED ORDER — ACETAMINOPHEN 325 MG PO TABS: 650.0000 mg | ORAL_TABLET | Freq: Four times a day (QID) | ORAL | Status: DC | PRN

## 2024-07-18 MED ORDER — ATORVASTATIN CALCIUM 10 MG PO TABS
10.0000 mg | ORAL_TABLET | Freq: Every day | ORAL | Status: DC
  Administered 2024-07-18 – 2024-07-20 (×3): 10 mg via ORAL
  Filled 2024-07-18 (×3): qty 1

## 2024-07-18 MED ORDER — PANTOPRAZOLE SODIUM 40 MG PO TBEC
40.0000 mg | DELAYED_RELEASE_TABLET | Freq: Every day | ORAL | Status: DC
  Administered 2024-07-18 – 2024-07-20 (×3): 40 mg via ORAL
  Filled 2024-07-18 (×3): qty 1

## 2024-07-18 MED ORDER — LEVOTHYROXINE SODIUM 50 MCG PO TABS
50.0000 ug | ORAL_TABLET | Freq: Every day | ORAL | Status: DC
  Administered 2024-07-19 – 2024-07-20 (×2): 50 ug via ORAL
  Filled 2024-07-18 (×2): qty 1

## 2024-07-18 MED ORDER — ACETAMINOPHEN 650 MG RE SUPP
650.0000 mg | Freq: Four times a day (QID) | RECTAL | Status: DC | PRN
Start: 2024-07-18 — End: 2024-07-20

## 2024-07-18 NOTE — Progress Notes (Signed)
 Gastroenterology Office Note     Primary Care Physician:  Marvine Rush, MD  Primary Gastroenterologist: Dr. Shaaron    Chief Complaint   Chief Complaint  Patient presents with   Rectal Pain    Patient here today due to rectal pain and bleeding. Patient says he has two sores on his rectum. This has been ongoing since the last visit. He has been using a cream from [facility].      History of Present Illness   Jonathan Grimes is a 68 y.o. male presenting today with a history of symptomatic hemorrhoids that I had started banding since June 2025, previously seen in office that year for rectal bleeding, s/p banding of all 3 columns earlier this year and neutral banding, history of long-standing IDA, chronic perianal rash improved with cream in past, hepatic steatosis and alcohol use, and presented to see me in Oct 2025 with marked decline in his overall health, reporting fatigue, DOE, noted to have prominent murmur on exam. In light of hx of IDA, additional labs to check CBC and iron studies were ordered along with ECHO. ECHO showed mild to moderate aortic valve stenosis, and I referred him to Cardiology. Labs were not completed, but he did see his PCP recently who was ordering labs. US  abdomen with hepatic steatosis and splenomegaly. Plans were for EGD once optimized and cleared from Cardiology standpoint. He is here today due to his wife's concern for pressure ulcer on buttocks.   Upon entering room, patient is quite pale. I do not have recent labs to review. We had already requested labs from PCP but not received. Wife is present with him today. He notes intermittent dizziness, chills but no fever, significant fatigue, notable shortness of breath with minimal exertion but no SOB at rest. No chest pain. States he has had dysphagia for the past 4 weeks, which is new. Early satiety has been going on for quite some time and unsure how long.   2 sores between buttocks per wife, which  patient showed her yesterday. Notes prolonged sitting, decreased mobility due to fatigue. Intermittent bleeding from buttocks. No constipation. SABRA Alstrom black stool yesterday but taking 2 iron a day.   Weight loss, early satiety, with weight earlier this year 260/270. He has lost down to 240 and 14 those lbs were since last visit.   Elevated transaminases: US  with fatty liver, spleen mildly enlarged, need labs to update No ETOH currently, previously would drink about 12 ounces of wine about a few times a week., none in 4 weeks. Normal platelets in the past.   Outside labs in July 2025 with Hgb 10.4, Hct 33.4, MCV 106, Platelets 168, Tbili 0.3, Alk Phos 176, ASt 95, ALT 41. Baseline Hgb 9/10 range   Last colonoscopy May 2025: non-bleeding external and internal hemorrhoids, moderate diverticulosis in sigmoid and descending, redundant colon, otherwise normal    EGD 2017: MFM:jwumjo erosiona s/p bx (no h.pylri, gastric mucosa with intestinal metaplasia)    Givens 2017: scattered erosions in in stomach, small bowel unremrakable.    Past Medical History:  Diagnosis Date   Arthritis    SEVERE OA LEFT HIP WITH PAIN   Diabetes mellitus without complication (HCC)    type 2   Elevated cholesterol    Fatty liver    GERD (gastroesophageal reflux disease)    H/O hiatal hernia    Hiatal hernia    Hypertension    Schatzki's ring    Sleep apnea  Tubular adenoma     Past Surgical History:  Procedure Laterality Date   BIOPSY N/A 10/02/2015   Procedure: BIOPSY;  Surgeon: Lamar CHRISTELLA Hollingshead, MD;  [facility]: [facility];  Service: Endoscopy;  Laterality: N/A;  Gastric bx   COLONOSCOPY  01/19/2010   Dr.Rourk- friable anorectal. o/w normal rectum, L sided diverticula, sigmoid polyp bx= tubular adenoma,   COLONOSCOPY N/A 10/02/2015   MFM:wnmfjo appearing rectal/scattered sigmoid diverticula, next TCS five years for h/o colon adenomas   COLONOSCOPY N/A 01/04/2024   Procedure: COLONOSCOPY;  Surgeon:  Hollingshead Lamar CHRISTELLA, MD;  Location: [facility];  Service: Endoscopy;  Laterality: N/A;  730AM, ASA 3   COLONOSCOPY WITH PROPOFOL  N/A 01/04/2022   Procedure: COLONOSCOPY WITH PROPOFOL ;  Surgeon: Hollingshead Lamar CHRISTELLA, MD;  Location: [facility];  Service: Endoscopy;  Laterality: N/A;  7:30 / ASA 2   ESOPHAGOGASTRODUODENOSCOPY  01/19/2010   Dr.Rourk- noncritical schatzki's ring, o/w normal esophagus, small hiatal hernia o/w normal stomach D1, D2   ESOPHAGOGASTRODUODENOSCOPY N/A 10/02/2015   MFM:jwumjo erosiona s/p bx (no h.pylri, gastric mucosa with intestinal metaplasia)   GIVENS CAPSULE STUDY N/A 11/21/2015   Procedure: GIVENS CAPSULE STUDY;  Surgeon: Lamar CHRISTELLA Hollingshead, MD;  Location: [facility];  Service: Endoscopy;  Laterality: N/A;  0700   JOINT REPLACEMENT  08/30/2010   RIGHT TOTAL KNEE ARTHROPLASTY   TOTAL HIP ARTHROPLASTY  07/28/2012   Procedure: TOTAL HIP ARTHROPLASTY ANTERIOR APPROACH;  Surgeon: Lonni CINDERELLA Poli, MD;  Location: [facility];  Service: Orthopedics;  Laterality: Left;   TOTAL HIP ARTHROPLASTY Right 02/22/2014   Procedure: RIGHT TOTAL HIP ARTHROPLASTY ANTERIOR APPROACH;  Surgeon: Lonni CINDERELLA Poli, MD;  Location: [facility];  Service: Orthopedics;  Laterality: Right;   TOTAL KNEE REVISION Right 07/11/2015   Procedure: REVISION RIGHT TOTAL KNEE ARTHROPLASTYone component only;  Surgeon: Lonni CINDERELLA Poli, MD;  Location: [facility];  Service: Orthopedics;  Laterality: Right;    Current Outpatient Medications  Medication Sig Dispense Refill   allopurinol (ZYLOPRIM) 100 MG tablet Take 100 mg by mouth daily. (Patient taking differently: Take 200 mg by mouth daily.)     amLODipine (NORVASC) 10 MG tablet Take 10 mg by mouth daily.     aspirin  EC 81 MG tablet Take 81 mg by mouth daily.     atorvastatin  (LIPITOR) 10 MG tablet 1 tablet Orally Once a day for 30 day(s)     diclofenac (VOLTAREN) 75 MG EC tablet Take 75 mg by mouth 2 (two) times daily.     ENBREL SURECLICK 50 MG/ML  injection once a week.     escitalopram (LEXAPRO) 10 MG tablet Take 10 mg by mouth daily.     ferrous sulfate  325 (65 FE) MG tablet Take 325 mg by mouth 2 (two) times daily.     fluticasone (FLONASE) 50 MCG/ACT nasal spray Place 2 sprays into both nostrils daily. 16 g 6   levothyroxine (SYNTHROID) 50 MCG tablet Take 50 mcg by mouth daily.     loratadine (CLARITIN) 10 MG tablet Take 1 tablet (10 mg total) by mouth daily. 90 tablet 11   losartan  (COZAAR ) 100 MG tablet Take 100 mg by mouth daily.     metFORMIN  (GLUCOPHAGE ) 1000 MG tablet Take 1,000 mg by mouth 2 (two) times daily.     omeprazole (PRILOSEC) 40 MG capsule Take 40 mg by mouth daily.     nystatin -triamcinolone  ointment (MYCOLOG) Apply 1 Application topically 2 (two) times daily. Between buttocks (Patient not taking: Reported on 07/18/2024)  30 g 1   No current facility-administered medications for this visit.    Allergies as of 07/18/2024   (No Known Allergies)    History reviewed. No pertinent family history.  Social History   Socioeconomic History   Marital status: Married    Spouse name: Not on file   Number of children: Not on file   Years of education: Not on file   Highest education level: Not on file  Occupational History   Not on file  Tobacco Use   Smoking status: Former    Current packs/day: 0.00    Types: Cigarettes    Quit date: 01/06/1982    Years since quitting: 42.5   Smokeless tobacco: Current    Types: Chew   Tobacco comments:    QUIT SMOKING 34 YRS AGO  QUIT CHEWING TOBACCO JAN 2012  Vaping Use   Vaping status: Never Used  Substance and Sexual Activity   Alcohol use: Yes    Comment: beer occasionally   Drug use: No   Sexual activity: Not on file  Other Topics Concern   Not on file  Social History Narrative   Not on file   Social Drivers of Health   Financial Resource Strain: Not on file  Food Insecurity: Not on file  Transportation Needs: Not on file  Physical Activity: Not on file   Stress: Not on file  Social Connections: Not on file  Intimate Partner Violence: Not on file     Review of Systems   See HPI   Physical Exam   BP 118/77 (BP Location: Left Arm, Patient Position: Sitting, Cuff Size: Normal)   Pulse 96   Temp 97.7 F (36.5 C) (Temporal)   Ht 6' 1 (1.854 m)   Wt 240 lb 1.6 oz (108.9 kg)   BMI 31.68 kg/m  General:   Alert and oriented. Pale, chronically ill-appearing, frail, difficulty walking without assistance, unable to get on exam table Head:  Normocephalic and atraumatic. Rectal:  limited exam as unable to get on table, evaluated between buttocks with chaperone. Left medial buttock with dime-sized pressure ulcer stage 2, right buttock with unstageable 3 cm long ulcer with sloughing in the bed of ulcer, and evidence for deep tissue pressure injuries at buttocks with purple tissue  Neurologic:  Alert and  oriented x4   Assessment   68 y.o. male presenting today with a history of symptomatic hemorrhoids that I had been banding with good results since June 2025, previously seen in office that year for rectal bleeding, long-standing IDA, chronic perianal rash improved with cream in past, hepatic steatosis and alcohol use, noted to have marked decline in health at visit recently in Oct 2025 with fatigue, DOE, and found to have mild to moderate aortic stenosis on ECHO with referral to Cardiology started, additional labs ordered to check CBC and anemia panel but not completed, now presenting with worsening constellation of symptoms to include fatigue, DOE, early satiety, new onset dysphagia, and pressure ulcers on buttocks.  Fatigue, DOE: newly diagnosed mild to moderate aortic stenosis and has not established with cardiology yet but has seen PCP in interim from last visit. Worsening DOE noted today, and I suspect he has had worsening anemia as well exacerbating this.  Hx of IDA: last labs I had on file from July with Hgb 10.4, and I had requested  updated labs with anemia panel at last visit but not completed. He is increasingly pale, and he notes black stool recently although he takes  iron daily. Highly concern for UGI process in this case. Recent colonoscopy earlier this year. EGD last in 2017 with gastric intestinal metaplasia. Capsule in past unrevealing. His reports of early satiety and new onset dysphagia are also concerning, along with documented rapid weight loss. Concern for occult malignancy. Needs diagnostic EGD but will need to be optimized from cardiopulmonary standpoint. Still remains to be seen if appropriate for anesthesia at [facility].  Hepatic steatosis: with mild splenomegaly, normal platelets historically, +ETOH use but none in 4 weeks, will need to follow this as outpatient. Doubt dealing with portal hypertension at this point but suspect underlying fibrosis.   Pressure ulcers: bilateral buttocks, stage 2 and unstageable with sloughing, evidence for deep tissue pressure injuries as well. Needs wound ostomy nurse on admission.    In light of his marked decline and likely symptomatic anemia, worsening cardiopulmonary status, I have advised him to present to the ED for optimization. Unclear if he will be an anesthesia candidate here at [facility]. I have spoken to ED physician and informed of patient's history. Spoke with admitting hospitalist and requested NPO after midnight so GI can assess tomorrow morning. I also recommend cardiology consultation while inpatient. Would benefit from wound nurse as well. I also relayed patient's history to on call APP today and on call APP for tomorrow. He will be reassessed in the morning.   Therisa MICAEL Stager, PhD, ANP-BC [facility]astro[facility]

## 2024-07-18 NOTE — ED Provider Notes (Signed)
 [facility] EMERGENCY DEPARTMENT AT [facility] Provider Note   CSN: [id number] Arrival date & time: 07/18/24  1018     Patient presents with: Shortness of Breath   Jonathan Grimes is a 68 y.o. male obesity, mild AS on echo this month Nov 2025 with normal EF, presenting from GI office with several concerns.  Patient complaining of SOB, dizziness, sore on sacrum, weakness.  GI clinic reached out to me with concern for severe DOE, pale with standing, needing emergent evaluation.  Pt needing EGD soon but not emergently until cardiac clearance.  GI will follow along if hospitalized.  Pt reporting worsening dyspnea on exertion for a few weeks, can't hardly walk 40-50 feet outside his house without stopping to rest.  Not taking diuretic currently.  Denies leg swelling or chest pain.  Reports frankly bloody stools on and off for a few weeks, hx of hemorrhoidal banding   HPI     Prior to Admission medications   Medication Sig Start Date End Date Taking? Authorizing Provider  allopurinol (ZYLOPRIM) 100 MG tablet Take 100 mg by mouth daily. Patient taking differently: Take 200 mg by mouth daily.   Yes [provider]  atorvastatin  (LIPITOR) 10 MG tablet 1 tablet Orally Once a day for 30 day(s)   Yes [provider]  escitalopram (LEXAPRO) 10 MG tablet Take 10 mg by mouth daily. 09/13/23  Yes [provider]  fluticasone (FLONASE) 50 MCG/ACT nasal spray Place 2 sprays into both nostrils daily. 07/10/24  Yes Hedges, Reyes, PA-C  levothyroxine (SYNTHROID) 50 MCG tablet Take 50 mcg by mouth daily. 07/05/24  Yes [provider]  loratadine (CLARITIN) 10 MG tablet Take 1 tablet (10 mg total) by mouth daily. 07/10/24  Yes Hedges, Reyes, PA-C  omeprazole (PRILOSEC) 40 MG capsule Take 40 mg by mouth daily.   Yes [provider]    Allergies: Patient has no known allergies.    Review of Systems  Updated Vital Signs BP (!) 138/94 (BP  Location: Left Arm)   Pulse 97   Temp 98.2 F (36.8 C) (Oral)   Resp (!) 24   Ht 6' 1 (1.854 m)   Wt 109.2 kg   SpO2 96%   BMI 31.76 kg/m   Physical Exam Constitutional:      General: He is not in acute distress.    Appearance: He is obese.  HENT:     Head: Normocephalic and atraumatic.  Eyes:     Conjunctiva/sclera: Conjunctivae normal.     Pupils: Pupils are equal, round, and reactive to light.  Cardiovascular:     Rate and Rhythm: Normal rate and regular rhythm.  Pulmonary:     Effort: Pulmonary effort is normal. No respiratory distress.  Abdominal:     General: There is no distension.     Tenderness: There is no abdominal tenderness.  Skin:    General: Skin is warm and dry.     Comments: Sacral pressure sore  Neurological:     General: No focal deficit present.     Mental Status: He is alert. Mental status is at baseline.  Psychiatric:        Mood and Affect: Mood normal.        Behavior: Behavior normal.     (all labs ordered are listed, but only abnormal results are displayed) Labs Reviewed  CBC WITH DIFFERENTIAL/PLATELET - Abnormal; Notable for the following components:      Result Value   RBC 2.62 (*)  Hemoglobin 8.0 (*)    HCT 25.8 (*)    All other components within normal limits  COMPREHENSIVE METABOLIC PANEL WITH GFR - Abnormal; Notable for the following components:   CO2 17 (*)    Glucose, Bld 144 (*)    BUN 48 (*)    Creatinine, Ser 2.76 (*)    Calcium  8.8 (*)    Albumin 3.4 (*)    Alkaline Phosphatase 251 (*)    GFR, Estimated 24 (*)    Anion gap 17 (*)    All other components within normal limits  PRO BRAIN NATRIURETIC PEPTIDE - Abnormal; Notable for the following components:   Pro Brain Natriuretic Peptide 1,394.0 (*)    All other components within normal limits  TROPONIN T, HIGH SENSITIVITY - Abnormal; Notable for the following components:   Troponin T High Sensitivity 71 (*)    All other components within normal limits  TROPONIN T,  HIGH SENSITIVITY - Abnormal; Notable for the following components:   Troponin T High Sensitivity 65 (*)    All other components within normal limits  HIV ANTIBODY (ROUTINE TESTING W REFLEX)  SODIUM, URINE, RANDOM  CREATININE, URINE, RANDOM  CBC  BASIC METABOLIC PANEL WITH GFR  TYPE AND SCREEN    EKG: EKG Interpretation Date/Time:  Wednesday July 18 2024 10:40:16 EST Ventricular Rate:  102 PR Interval:  178 QRS Duration:  86 QT Interval:  350 QTC Calculation: 456 R Axis:   -22  Text Interpretation: Sinus tachycardia Borderline left axis deviation Low voltage, precordial leads Confirmed by Cottie Cough [phone]) on 07/18/2024 10:55:15 AM  Radiology: CT CHEST WO CONTRAST Result Date: 07/18/2024 EXAM: CT CHEST WITHOUT CONTRAST 07/18/2024 04:07:43 PM TECHNIQUE: CT of the chest was performed without the administration of intravenous contrast. Multiplanar reformatted images are provided for review. Automated exposure control, iterative reconstruction, and/or weight based adjustment of the mA/kV was utilized to reduce the radiation dose to as low as reasonably achievable. COMPARISON: None available. CLINICAL HISTORY: Dyspnea, chronic, unclear etiology. FINDINGS: MEDIASTINUM: Heart size is at the upper limits of normal. Aortic atherosclerosis. The main pulmonary artery measures 3.3 cm (image 68/2). Three-vessel coronary artery calcifications. Aortic and mitral valve calcifications. No pericardial effusion. The central airways are patent. LYMPH NODES: No mediastinal, hilar or axillary lymphadenopathy. LUNGS AND PLEURA: Bilateral subpleural interstitial reticulation and subpleural banding identified within the posterior lung bases. There is partial atelectasis and volume loss involving the basilar portions of the right middle lobe. No pleural effusion or pneumothorax. No interstitial edema or airspace consolidation. SOFT TISSUES/BONES: Mild degenerative changes within the thoracic spine. Remote  left lateral 8th rib fracture. No acute abnormality of the soft tissues. UPPER ABDOMEN: Limited images of the upper abdomen demonstrates no acute abnormality. IMPRESSION: 1. No acute cardiopulmonary abnormality identified. 2. Subpleural reticulation and parenchymal banding within the posterior lung bases are nonspecific but may be seen in the setting of chronic interstitial lung disease. 3. Partial atelectasis and volume loss involving the basilar right middle lobe. 4. 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 5. Aortic atherosclerosis and 3-vessel coronary artery calcifications. Electronically signed by: Waddell Calk MD 07/18/2024 04:36 PM EST RP Workstation: [id number]   DG Chest 2 View Result Date: 07/18/2024 EXAM: 2 VIEW(S) XRAY OF THE CHEST 07/18/2024 11:07:00 AM COMPARISON: None available. CLINICAL HISTORY: SOB FINDINGS: LUNGS AND PLEURA: Asymmetric elevation of right hemidiaphragm. No focal pulmonary opacity. No pleural effusion. No pneumothorax. HEART AND MEDIASTINUM: No acute abnormality of the cardiac and mediastinal  silhouettes. BONES AND SOFT TISSUES: Thoracic degenerative changes. No acute osseous abnormality. IMPRESSION: 1. Asymmetric elevation of the right hemidiaphragm. 2. No acute findings. Electronically signed by: Norleen Boxer MD 07/18/2024 11:32 AM EST RP Workstation: [id number]     Procedures   Medications Ordered in the ED  acetaminophen  (TYLENOL ) tablet 650 mg (has no administration in time range)    Or  acetaminophen  (TYLENOL ) suppository 650 mg (has no administration in time range)  albuterol  (PROVENTIL ) (2.5 MG/3ML) 0.083% nebulizer solution 2.5 mg (has no administration in time range)  atorvastatin  (LIPITOR) tablet 10 mg (10 mg Oral Given 07/18/24 1713)  escitalopram  (LEXAPRO ) tablet 10 mg (10 mg Oral Given 07/18/24 1713)  levothyroxine  (SYNTHROID ) tablet 50 mcg (50 mcg Oral Patient Refused/Not Given 07/18/24 1616)  fluticasone   (FLONASE ) 50 MCG/ACT nasal spray 2 spray (has no administration in time range)  loratadine  (CLARITIN ) tablet 10 mg (10 mg Oral Patient Refused/Not Given 07/18/24 1617)  pantoprazole  (PROTONIX ) EC tablet 40 mg (40 mg Oral Given 07/18/24 1713)  lactated ringers  infusion ( Intravenous New Bag/Given 07/18/24 1717)  allopurinol  (ZYLOPRIM ) tablet 100 mg (100 mg Oral Given 07/18/24 1713)  lactated ringers  bolus 1,000 mL (1,000 mLs Intravenous New Bag/Given 07/18/24 1457)    Clinical Course as of 07/18/24 1730  Wed Jul 18, 2024  1315 Admitted to hospitalist who requested NOT diuresing at this time, will discontinue lasix  [MT]    Clinical Course User Index [MT] Buna Cuppett, Donnice PARAS, MD                                 Medical Decision Making Amount and/or Complexity of Data Reviewed Labs: ordered. Radiology: ordered.  Risk Prescription drug management. Decision regarding hospitalization.   This patient presents to the ED with concern for shortness of breath. This involves an extensive number of treatment options, and is a complaint that carries with it a high risk of complications and morbidity.  The differential diagnosis includes CHF vs PNA vs viral illness including covid 19 vs anemia vs other  Co-morbidities that complicate the patient evaluation: hx of GI bleed  Additional history obtained from GI provider by phone, wife at bedside  External records from outside source obtained and reviewed including GI office records today  I ordered and personally interpreted labs.  The pertinent results include:  BNP elevated 1394, BUN 48, Cr 2.76, trop mildly elevated but flat  I ordered imaging studies including dg chest  I independently visualized and interpreted imaging which showed no emergent findings I agree with the radiologist interpretation  The patient was maintained on a cardiac monitor.  I personally viewed and interpreted the cardiac monitored which showed an underlying rhythm of:  sinus rhythm  Per my interpretation the patient's ECG shows sinus tachycardia  I have reviewed the patients home medicines and have made adjustments as needed  Test Considered: lower suspicion for acute PE in this clinical setting, doubt ACS, sepsis  Dyspnea likely multifactorial including worsening anemia (from suspected gradual GI bleed) and new onset CHF.  He has worsening CKD as well, which may be pre-renal.  Holding off on diuresis as there is no evident hypoxia at this time or respiratory distress.  Also holding off on blood transfusion with hgb > 7 and BP stable.   We'll admit for echocardiogram, GI consultation, continued workup for anemia and dyspnea.  Patient and wife in agreement.  Dispostion:  After consideration of the diagnostic results  and the patients response to treatment, I feel that the patent would benefit from medical admission.      Final diagnoses:  Acute on chronic congestive heart failure, unspecified heart failure type (HCC)  Anemia, unspecified type  Shortness of breath  AKI (acute kidney injury)    ED Discharge Orders     None          Cottie Donnice PARAS, MD 07/18/24 1733

## 2024-07-18 NOTE — ED Triage Notes (Signed)
 Pt states he was sent here by GI doctor, Therisa Stager, for shob. Pt states shob and dizziness started x 6-7 months ago., denies any prior respiratory Dx or cardiac issues except for something with his aorta denies CP. Spo2 96% RA. Endorses bilateral sores on his buttocks that he noticed yesterday. Denies any pain but states his butt is sore

## 2024-07-18 NOTE — H&P (View-Only) (Signed)
 Gastroenterology Office Note     Primary Care Physician:  Marvine Rush, MD  Primary Gastroenterologist: Dr. Shaaron    Chief Complaint   Chief Complaint  Patient presents with   Rectal Pain    Patient here today due to rectal pain and bleeding. Patient says he has two sores on his rectum. This has been ongoing since the last visit. He has been using a cream from [facility].      History of Present Illness   Jonathan Grimes is a 68 y.o. male presenting today with a history of symptomatic hemorrhoids that I had started banding since June 2025, previously seen in office that year for rectal bleeding, s/p banding of all 3 columns earlier this year and neutral banding, history of long-standing IDA, chronic perianal rash improved with cream in past, hepatic steatosis and alcohol use, and presented to see me in Oct 2025 with marked decline in his overall health, reporting fatigue, DOE, noted to have prominent murmur on exam. In light of hx of IDA, additional labs to check CBC and iron studies were ordered along with ECHO. ECHO showed mild to moderate aortic valve stenosis, and I referred him to Cardiology. Labs were not completed, but he did see his PCP recently who was ordering labs. US  abdomen with hepatic steatosis and splenomegaly. Plans were for EGD once optimized and cleared from Cardiology standpoint. He is here today due to his wife's concern for pressure ulcer on buttocks.   Upon entering room, patient is quite pale. I do not have recent labs to review. We had already requested labs from PCP but not received. Wife is present with him today. He notes intermittent dizziness, chills but no fever, significant fatigue, notable shortness of breath with minimal exertion but no SOB at rest. No chest pain. States he has had dysphagia for the past 4 weeks, which is new. Early satiety has been going on for quite some time and unsure how long.   2 sores between buttocks per wife, which  patient showed her yesterday. Notes prolonged sitting, decreased mobility due to fatigue. Intermittent bleeding from buttocks. No constipation. SABRA Alstrom black stool yesterday but taking 2 iron a day.   Weight loss, early satiety, with weight earlier this year 260/270. He has lost down to 240 and 14 those lbs were since last visit.   Elevated transaminases: US  with fatty liver, spleen mildly enlarged, need labs to update No ETOH currently, previously would drink about 12 ounces of wine about a few times a week., none in 4 weeks. Normal platelets in the past.   Outside labs in July 2025 with Hgb 10.4, Hct 33.4, MCV 106, Platelets 168, Tbili 0.3, Alk Phos 176, ASt 95, ALT 41. Baseline Hgb 9/10 range   Last colonoscopy May 2025: non-bleeding external and internal hemorrhoids, moderate diverticulosis in sigmoid and descending, redundant colon, otherwise normal    EGD 2017: MFM:jwumjo erosiona s/p bx (no h.pylri, gastric mucosa with intestinal metaplasia)    [facility] 2017: scattered erosions in in stomach, small bowel unremrakable.    Past Medical History:  Diagnosis Date   Arthritis    SEVERE OA LEFT HIP WITH PAIN   Diabetes mellitus without complication (HCC)    type 2   Elevated cholesterol    Fatty liver    GERD (gastroesophageal reflux disease)    H/O hiatal hernia    Hiatal hernia    Hypertension    Schatzki's ring    Sleep apnea  Tubular adenoma     Past Surgical History:  Procedure Laterality Date   BIOPSY N/A 10/02/2015   Procedure: BIOPSY;  Surgeon: Lamar CHRISTELLA Hollingshead, MD;  Location: [facility];  Service: Endoscopy;  Laterality: N/A;  Gastric bx   COLONOSCOPY  01/19/2010   Dr.Rourk- friable anorectal. o/w normal rectum, L sided diverticula, sigmoid polyp bx= tubular adenoma,   COLONOSCOPY N/A 10/02/2015   MFM:wnmfjo appearing rectal/scattered sigmoid diverticula, next TCS five years for h/o colon adenomas   COLONOSCOPY N/A 01/04/2024   Procedure: COLONOSCOPY;  Surgeon:  Hollingshead Lamar CHRISTELLA, MD;  Location: [facility];  Service: Endoscopy;  Laterality: N/A;  730AM, ASA 3   COLONOSCOPY WITH PROPOFOL  N/A 01/04/2022   Procedure: COLONOSCOPY WITH PROPOFOL ;  Surgeon: Hollingshead Lamar CHRISTELLA, MD;  Location: [facility];  Service: Endoscopy;  Laterality: N/A;  7:30 / ASA 2   ESOPHAGOGASTRODUODENOSCOPY  01/19/2010   Dr.Rourk- noncritical schatzki's ring, o/w normal esophagus, small hiatal hernia o/w normal stomach D1, D2   ESOPHAGOGASTRODUODENOSCOPY N/A 10/02/2015   MFM:jwumjo erosiona s/p bx (no h.pylri, gastric mucosa with intestinal metaplasia)   GIVENS CAPSULE STUDY N/A 11/21/2015   Procedure: GIVENS CAPSULE STUDY;  Surgeon: Lamar CHRISTELLA Hollingshead, MD;  Location: [facility];  Service: Endoscopy;  Laterality: N/A;  0700   JOINT REPLACEMENT  08/30/2010   RIGHT TOTAL KNEE ARTHROPLASTY   TOTAL HIP ARTHROPLASTY  07/28/2012   Procedure: TOTAL HIP ARTHROPLASTY ANTERIOR APPROACH;  Surgeon: Lonni CINDERELLA Poli, MD;  Location: [facility];  Service: Orthopedics;  Laterality: Left;   TOTAL HIP ARTHROPLASTY Right 02/22/2014   Procedure: RIGHT TOTAL HIP ARTHROPLASTY ANTERIOR APPROACH;  Surgeon: Lonni CINDERELLA Poli, MD;  Location: [facility];  Service: Orthopedics;  Laterality: Right;   TOTAL KNEE REVISION Right 07/11/2015   Procedure: REVISION RIGHT TOTAL KNEE ARTHROPLASTYone component only;  Surgeon: Lonni CINDERELLA Poli, MD;  Location: [facility];  Service: Orthopedics;  Laterality: Right;    Current Outpatient Medications  Medication Sig Dispense Refill   allopurinol (ZYLOPRIM) 100 MG tablet Take 100 mg by mouth daily. (Patient taking differently: Take 200 mg by mouth daily.)     amLODipine (NORVASC) 10 MG tablet Take 10 mg by mouth daily.     aspirin  EC 81 MG tablet Take 81 mg by mouth daily.     atorvastatin  (LIPITOR) 10 MG tablet 1 tablet Orally Once a day for 30 day(s)     diclofenac (VOLTAREN) 75 MG EC tablet Take 75 mg by mouth 2 (two) times daily.     ENBREL SURECLICK 50 MG/ML  injection once a week.     escitalopram (LEXAPRO) 10 MG tablet Take 10 mg by mouth daily.     ferrous sulfate  325 (65 FE) MG tablet Take 325 mg by mouth 2 (two) times daily.     fluticasone (FLONASE) 50 MCG/ACT nasal spray Place 2 sprays into both nostrils daily. 16 g 6   levothyroxine (SYNTHROID) 50 MCG tablet Take 50 mcg by mouth daily.     loratadine (CLARITIN) 10 MG tablet Take 1 tablet (10 mg total) by mouth daily. 90 tablet 11   losartan  (COZAAR ) 100 MG tablet Take 100 mg by mouth daily.     metFORMIN  (GLUCOPHAGE ) 1000 MG tablet Take 1,000 mg by mouth 2 (two) times daily.     omeprazole (PRILOSEC) 40 MG capsule Take 40 mg by mouth daily.     nystatin -triamcinolone  ointment (MYCOLOG) Apply 1 Application topically 2 (two) times daily. Between buttocks (Patient not taking: Reported on 07/18/2024)  30 g 1   No current facility-administered medications for this visit.    Allergies as of 07/18/2024   (No Known Allergies)    History reviewed. No pertinent family history.  Social History   Socioeconomic History   Marital status: Married    Spouse name: Not on file   Number of children: Not on file   Years of education: Not on file   Highest education level: Not on file  Occupational History   Not on file  Tobacco Use   Smoking status: Former    Current packs/day: 0.00    Types: Cigarettes    Quit date: 01/06/1982    Years since quitting: 42.5   Smokeless tobacco: Current    Types: Chew   Tobacco comments:    QUIT SMOKING 34 YRS AGO  QUIT CHEWING TOBACCO JAN 2012  Vaping Use   Vaping status: Never Used  Substance and Sexual Activity   Alcohol use: Yes    Comment: beer occasionally   Drug use: No   Sexual activity: Not on file  Other Topics Concern   Not on file  Social History Narrative   Not on file   Social Drivers of Health   Financial Resource Strain: Not on file  Food Insecurity: Not on file  Transportation Needs: Not on file  Physical Activity: Not on file   Stress: Not on file  Social Connections: Not on file  Intimate Partner Violence: Not on file     Review of Systems   See HPI   Physical Exam   BP 118/77 (BP Location: Left Arm, Patient Position: Sitting, Cuff Size: Normal)   Pulse 96   Temp 97.7 F (36.5 C) (Temporal)   Ht 6' 1 (1.854 m)   Wt 240 lb 1.6 oz (108.9 kg)   BMI 31.68 kg/m  General:   Alert and oriented. Pale, chronically ill-appearing, frail, difficulty walking without assistance, unable to get on exam table Head:  Normocephalic and atraumatic. Rectal:  limited exam as unable to get on table, evaluated between buttocks with chaperone. Left medial buttock with dime-sized pressure ulcer stage 2, right buttock with unstageable 3 cm long ulcer with sloughing in the bed of ulcer, and evidence for deep tissue pressure injuries at buttocks with purple tissue  Neurologic:  Alert and  oriented x4   Assessment   68 y.o. male presenting today with a history of symptomatic hemorrhoids that I had been banding with good results since June 2025, previously seen in office that year for rectal bleeding, long-standing IDA, chronic perianal rash improved with cream in past, hepatic steatosis and alcohol use, noted to have marked decline in health at visit recently in Oct 2025 with fatigue, DOE, and found to have mild to moderate aortic stenosis on ECHO with referral to Cardiology started, additional labs ordered to check CBC and anemia panel but not completed, now presenting with worsening constellation of symptoms to include fatigue, DOE, early satiety, new onset dysphagia, and pressure ulcers on buttocks.  Fatigue, DOE: newly diagnosed mild to moderate aortic stenosis and has not established with cardiology yet but has seen PCP in interim from last visit. Worsening DOE noted today, and I suspect he has had worsening anemia as well exacerbating this.  Hx of IDA: last labs I had on file from July with Hgb 10.4, and I had requested  updated labs with anemia panel at last visit but not completed. He is increasingly pale, and he notes black stool recently although he takes  iron daily. Highly concern for UGI process in this case. Recent colonoscopy earlier this year. EGD last in 2017 with gastric intestinal metaplasia. Capsule in past unrevealing. His reports of early satiety and new onset dysphagia are also concerning, along with documented rapid weight loss. Concern for occult malignancy. Needs diagnostic EGD but will need to be optimized from cardiopulmonary standpoint. Still remains to be seen if appropriate for anesthesia at [facility].  Hepatic steatosis: with mild splenomegaly, normal platelets historically, +ETOH use but none in 4 weeks, will need to follow this as outpatient. Doubt dealing with portal hypertension at this point but suspect underlying fibrosis.   Pressure ulcers: bilateral buttocks, stage 2 and unstageable with sloughing, evidence for deep tissue pressure injuries as well. Needs wound ostomy nurse on admission.    In light of his marked decline and likely symptomatic anemia, worsening cardiopulmonary status, I have advised him to present to the ED for optimization. Unclear if he will be an anesthesia candidate here at [facility]. I have spoken to ED physician and informed of patient's history. Spoke with admitting hospitalist and requested NPO after midnight so GI can assess tomorrow morning. I also recommend cardiology consultation while inpatient. Would benefit from wound nurse as well. I also relayed patient's history to on call APP today and on call APP for tomorrow. He will be reassessed in the morning.   Therisa MICAEL Stager, PhD, ANP-BC [facility]

## 2024-07-18 NOTE — Patient Instructions (Signed)
To the ED.

## 2024-07-18 NOTE — H&P (Signed)
 History and Physical:    Jonathan Grimes   FMW:[id number] DOB: 09/13/1955 DOA: 07/18/2024  Referring MD/provider: Therisa Stager PCP: Marvine Rush, MD   Patient coming from: Home  Chief Complaint: DOE, early satiety, weight loss, ongoing GI bleeding  History of Present Illness:   Jonathan Grimes is an 68 y.o. male with HTN, DM 2, psoriatic arthritis, gout, hypothyroidism who has had chronic GI bleed for the past 6 months since July 2025.  He has been followed by GI who have performed a colonoscopy and found hemorrhoidal bleeding which has been banded.  Patient was seen in GI clinic today for routine follow-up appointment at which point he was found to be very dyspneic and was sent to the ED.  Patient states that he has had progressive dyspnea over the past 6 months, notes it has been worse over the past 6 weeks.  No associated chest pain or pressure, no palpitations.  He does have orthopnea and notes he has had to sit up on the side of the bed to catch his breath over the past 6 weeks.  Patient denies any lower extremity edema.  Of note patient had an echocardiogram last week which was essentially normal except for mild-mod AS with gradient of 20 and mild diastolic dysfunction.  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  Denies any history of COPD.  Denies fevers chills or cough.  Review of systems is notable for 30 pound weight loss since January along with early satiety.  GI is planning on EGD but would like to ensure that AS would not be concerning during the procedure.  Patient denies alcohol use, denies increased abdominal girth, denies nausea or vomiting, denies dizziness or syncope but notes his exercise tolerance is markedly diminished due to his dyspnea on exertion.  ED Course:  The patient was noted to have normal vital  signs, afebrile with 94% O2 saturations on room air.  Laboratory data were concerning for hemoglobin of 8.0, down from 10 in May and creatinine of 2.6 with last known creatinine in July of 1.3, creatinine in May was 1.05.  Patient also noted to have elevated alk phos at 251 BNP was 1400 and troponins were 65 and flat.  EKG without any acute EKG changes concerning for ischemia.  ROS:   ROS   Review of Systems: Per HPI  Past Medical History:   Past Medical History:  Diagnosis Date   Arthritis    SEVERE OA LEFT HIP WITH PAIN   Diabetes mellitus without complication (HCC)    type 2   Elevated cholesterol    Fatty liver    GERD (gastroesophageal reflux disease)    H/O hiatal hernia    Hiatal hernia    Hypertension    Schatzki's ring    Sleep apnea    Tubular adenoma     Past Surgical History:   Past Surgical History:  Procedure Laterality Date   BIOPSY N/A 10/02/2015   Procedure: BIOPSY;  Surgeon: Lamar CHRISTELLA Hollingshead, MD;  Location: [facility];  Service: Endoscopy;  Laterality: N/A;  Gastric bx   COLONOSCOPY  01/19/2010   Dr.Rourk- friable anorectal. o/w normal rectum, L sided diverticula, sigmoid polyp bx= tubular adenoma,   COLONOSCOPY N/A 10/02/2015   MFM:wnmfjo appearing rectal/scattered sigmoid diverticula, next TCS five years for h/o colon  adenomas   COLONOSCOPY N/A 01/04/2024   Procedure: COLONOSCOPY;  Surgeon: Shaaron Lamar HERO, MD;  Location: [facility];  Service: Endoscopy;  Laterality: N/A;  730AM, ASA 3   COLONOSCOPY WITH PROPOFOL  N/A 01/04/2022   Procedure: COLONOSCOPY WITH PROPOFOL ;  Surgeon: Shaaron Lamar HERO, MD;  Location: [facility];  Service: Endoscopy;  Laterality: N/A;  7:30 / ASA 2   ESOPHAGOGASTRODUODENOSCOPY  01/19/2010   Dr.Rourk- noncritical schatzki's ring, o/w normal esophagus, small hiatal hernia o/w normal stomach D1, D2   ESOPHAGOGASTRODUODENOSCOPY N/A 10/02/2015   MFM:jwumjo erosiona s/p bx (no h.pylri, gastric mucosa with intestinal metaplasia)    GIVENS CAPSULE STUDY N/A 11/21/2015   Procedure: GIVENS CAPSULE STUDY;  Surgeon: Lamar HERO Shaaron, MD;  Location: [facility];  Service: Endoscopy;  Laterality: N/A;  0700   JOINT REPLACEMENT  08/30/2010   RIGHT TOTAL KNEE ARTHROPLASTY   TOTAL HIP ARTHROPLASTY  07/28/2012   Procedure: TOTAL HIP ARTHROPLASTY ANTERIOR APPROACH;  Surgeon: Lonni CINDERELLA Poli, MD;  Location: [facility];  Service: Orthopedics;  Laterality: Left;   TOTAL HIP ARTHROPLASTY Right 02/22/2014   Procedure: RIGHT TOTAL HIP ARTHROPLASTY ANTERIOR APPROACH;  Surgeon: Lonni CINDERELLA Poli, MD;  Location: [facility];  Service: Orthopedics;  Laterality: Right;   TOTAL KNEE REVISION Right 07/11/2015   Procedure: REVISION RIGHT TOTAL KNEE ARTHROPLASTYone component only;  Surgeon: Lonni CINDERELLA Poli, MD;  Location: [facility];  Service: Orthopedics;  Laterality: Right;    Social History:   Social History   Socioeconomic History   Marital status: Married    Spouse name: Not on file   Number of children: Not on file   Years of education: Not on file   Highest education level: Not on file  Occupational History   Not on file  Tobacco Use   Smoking status: Former    Current packs/day: 0.00    Types: Cigarettes    Quit date: 01/06/1982    Years since quitting: 42.5   Smokeless tobacco: Current    Types: Chew   Tobacco comments:    QUIT SMOKING 34 YRS AGO  QUIT CHEWING TOBACCO JAN 2012  Vaping Use   Vaping status: Never Used  Substance and Sexual Activity   Alcohol use: Yes    Comment: beer occasionally   Drug use: No   Sexual activity: Not on file  Other Topics Concern   Not on file  Social History Narrative   Not on file   Social Drivers of Health   Financial Resource Strain: Not on file  Food Insecurity: Not on file  Transportation Needs: Not on file  Physical Activity: Not on file  Stress: Not on file  Social Connections: Not on file  Intimate Partner Violence: Not on file    Allergies   Patient has  no known allergies.  Family history:   History reviewed. No pertinent family history.  Current Medications:   Prior to Admission medications   Medication Sig Start Date End Date Taking? Authorizing Provider  allopurinol (ZYLOPRIM) 100 MG tablet Take 100 mg by mouth daily. Patient taking differently: Take 200 mg by mouth daily.   Yes [provider]  aspirin  EC 81 MG tablet Take 81 mg by mouth daily.   Yes [provider]  atorvastatin  (LIPITOR) 10 MG tablet 1 tablet Orally Once a day for 30 day(s)   Yes [provider]  diclofenac (VOLTAREN) 75 MG EC tablet Take 75 mg by mouth 2 (two) times daily.   Yes [provider]  ENBREL SURECLICK 50 MG/ML injection once a week. 06/16/21  Yes [provider]  escitalopram (LEXAPRO) 10 MG tablet Take 10 mg by mouth daily. 09/13/23  Yes [provider]  ferrous sulfate  325 (65 FE) MG tablet Take 325 mg by mouth 2 (two) times daily.   Yes [provider]  fluticasone (FLONASE) 50 MCG/ACT nasal spray Place 2 sprays into both nostrils daily. 07/10/24  Yes Hedges, Reyes, PA-C  levothyroxine (SYNTHROID) 50 MCG tablet Take 50 mcg by mouth daily. 07/05/24  Yes [provider]  loratadine (CLARITIN) 10 MG tablet Take 1 tablet (10 mg total) by mouth daily. 07/10/24  Yes Hedges, Reyes, PA-C  losartan  (COZAAR ) 100 MG tablet Take 100 mg by mouth daily.   Yes [provider]  metFORMIN  (GLUCOPHAGE ) 1000 MG tablet Take 1,000 mg by mouth 2 (two) times daily.   Yes [provider]  nystatin -triamcinolone  ointment (MYCOLOG) Apply 1 Application topically 2 (two) times daily. Between buttocks 06/07/24  Yes Shirlean Therisa ORN, NP  omeprazole (PRILOSEC) 40 MG capsule Take 40 mg by mouth daily.   Yes [provider]    Physical Exam:   Vitals:   07/18/24 1042 07/18/24 1053  BP: 138/87   Pulse: 98 97  Resp: (!) 24 (!) 22  Temp: 97.7 F (36.5 C)   TempSrc: Oral   SpO2: 96%  94%     Physical Exam: Blood pressure 138/87, pulse 97, temperature 97.7 F (36.5 C), temperature source Oral, resp. rate (!) 22, SpO2 94%. Gen: Barrel chested man lying at 10 to 15 degrees in NAD with attentive wife at bedside Eyes: sclera anicteric, conjuctiva mildly injected bilaterally CVS: S1-S2, regulary, no gallops Respiratory: Good air entry bilaterally without wheezes, he does have some coarse crackles at the right base that did not clear with cough and deep breathing. GI: NABS, soft, NT, obese, I was unable to palpate a liver, no masses LE: No edema. No cyanosis Neuro: A/O x 3, Moving all extremities equally with normal strength, CN 3-12 intact, grossly nonfocal.  Psych: patient is logical and coherent, judgement and insight appear normal, mood and affect appropriate to situation. Skin: no rashes or lesions or ulcers,    Data Review:    Labs: Basic Metabolic Panel: Recent Labs  Lab 07/18/24 1105  NA 137  K 5.0  CL 103  CO2 17*  GLUCOSE 144*  BUN 48*  CREATININE 2.76*  CALCIUM  8.8*   Liver Function Tests: Recent Labs  Lab 07/18/24 1105  AST 33  ALT 21  ALKPHOS 251*  BILITOT 0.3  PROT 7.3  ALBUMIN 3.4*   No results for input(s): LIPASE, AMYLASE in the last 168 hours. No results for input(s): AMMONIA in the last 168 hours. CBC: Recent Labs  Lab 07/18/24 1105  WBC 8.3  NEUTROABS 6.1  HGB 8.0*  HCT 25.8*  MCV 98.5  PLT 204   Cardiac Enzymes: No results for input(s): CKTOTAL, CKMB, CKMBINDEX, TROPONINI in the last 168 hours.  BNP (last 3 results) Recent Labs    07/18/24 1105  PROBNP 1,394.0*   CBG: No results for input(s): GLUCAP in the last 168 hours.  Urinalysis    Component Value Date/Time   COLORURINE YELLOW 02/14/2014 1256   APPEARANCEUR CLEAR 02/14/2014 1256   LABSPEC 1.016 02/14/2014 1256   PHURINE 5.0 02/14/2014 1256   GLUCOSEU NEGATIVE 02/14/2014 1256   HGBUR NEGATIVE 02/14/2014 1256   BILIRUBINUR NEGATIVE  02/14/2014 1256   KETONESUR NEGATIVE 02/14/2014 1256  PROTEINUR NEGATIVE 02/14/2014 1256   UROBILINOGEN 0.2 02/14/2014 1256   NITRITE NEGATIVE 02/14/2014 1256   LEUKOCYTESUR SMALL (A) 02/14/2014 1256      Radiographic Studies: DG Chest 2 View Result Date: 07/18/2024 EXAM: 2 VIEW(S) XRAY OF THE CHEST 07/18/2024 11:07:00 AM COMPARISON: None available. CLINICAL HISTORY: SOB FINDINGS: LUNGS AND PLEURA: Asymmetric elevation of right hemidiaphragm. No focal pulmonary opacity. No pleural effusion. No pneumothorax. HEART AND MEDIASTINUM: No acute abnormality of the cardiac and mediastinal silhouettes. BONES AND SOFT TISSUES: Thoracic degenerative changes. No acute osseous abnormality. IMPRESSION: 1. Asymmetric elevation of the right hemidiaphragm. 2. No acute findings. Electronically signed by: Norleen Boxer MD 07/18/2024 11:32 AM EST RP Workstation: [id number]    EKG: Independently reviewed.  NSR at 100 Q in 3 and F, mild LAD, no acute ST-T wave changes   Assessment/Plan:   Principal Problem:   Dyspnea  68 year old man with subacute hematochezia attributed to hemorrhoids, psoriatic arthritis on Enbrel admitted for dyspnea.  Review of systems is notable for early satiety and a 30 pound weight loss over the past 11 months.  Laboratory workup notable for progressive decrease in hemoglobin, kidney dysfunction of unclear duration.  Dyspnea Orthopnea Differential diagnosis includes anemia, decompensated HFpEF or undiagnosed lung disease Echocardiogram from last month with normal EF and diastolic parameters, with mild to moderate AS with mean gradient of 20 Lungs are clear except for coarse crackles at the right base, chest x-ray is negative Will order noncontrast CT to evaluate for lung pathology Discussed with Dr. Alvan who notes EKG and echo are reassuring Albuterol nebulizer ordered as needed to see if it might be helpful  Kidney dysfunction of unclear duration Per chart review, patient  had creatinine 1.2 in July and 1.05 in May Urine is relatively bland Patient is on Enbrel Nephrology consult placed, discussed with Dr. Rayburn who will see him tomorrow Will order Fena labs  30 pound weight loss since January Early satiety Anemia secondary to ABLA and IDA Ongoing hemorrhoidal bleeding s/p banding H&H with downward trend over the past several months Patient is being actively followed by GI who are planning for EGD during this hospital stay Hold iron until GI workup is complete No need to transfuse at present given hemoglobin of 8 however this might decrease with hydration Continue PPI per home doses  Gout Polyarticular psoriatic arthritis  Polyarthralgia  Followed by  [facility] rheumatology on Enbrel and allopurinol Patient on allopurinol 200 mg daily, will decrease to 100 mg daily given GFR 24 Hold Enbrel  HTN Patient is on losartan , will hold pending renal workup May need to add another antihypertensive like a CCB or BB if warranted while patient is in house  Elevated alk phos Alk phos 176 in July 2025, now elevated to 251 Other liver function tests are normal Possible bone source of elevated alkaline phosphatase Depending on results of EGD, may need skeletal survey  Hypothyroidism Continue Synthroid 50 mcg per home doses  DM 2 Hold metformin  Carb modified diet Will check fingersticks ACHS without insulin  coverage SSI can be started if necessary  Anxiety and depression Continue escitalopram      Other information:   DVT prophylaxis: SCD ordered. Code Status: Full Family Communication: Patient's wife was at bedside throughout Disposition Plan: Home Consults called: Gastroenterology, nephrology Admission status: Observation  Saniya Tranchina Tublu Danaiya Steadman [facility]  If 7PM-7AM, please contact night-coverage [email or web address]

## 2024-07-18 NOTE — Plan of Care (Signed)

## 2024-07-19 ENCOUNTER — Encounter (HOSPITAL_COMMUNITY)
Admission: EM | Disposition: A | Discharge status: Home / Self Care | Payer: Self-pay | Source: Ambulatory Visit | Attending: Internal Medicine

## 2024-07-19 ENCOUNTER — Inpatient Hospital Stay (HOSPITAL_COMMUNITY): Admitting: Anesthesiology

## 2024-07-19 ENCOUNTER — Encounter (HOSPITAL_COMMUNITY): Payer: Self-pay | Admitting: Internal Medicine

## 2024-07-19 ENCOUNTER — Observation Stay (HOSPITAL_COMMUNITY)

## 2024-07-19 DIAGNOSIS — Z87891 Personal history of nicotine dependence: Secondary | ICD-10-CM

## 2024-07-19 DIAGNOSIS — D649 Anemia, unspecified: Secondary | ICD-10-CM | POA: Diagnosis not present

## 2024-07-19 DIAGNOSIS — M109 Gout, unspecified: Secondary | ICD-10-CM | POA: Diagnosis present

## 2024-07-19 DIAGNOSIS — E7800 Pure hypercholesterolemia, unspecified: Secondary | ICD-10-CM | POA: Diagnosis present

## 2024-07-19 DIAGNOSIS — R634 Abnormal weight loss: Secondary | ICD-10-CM | POA: Diagnosis present

## 2024-07-19 DIAGNOSIS — K250 Acute gastric ulcer with hemorrhage: Secondary | ICD-10-CM

## 2024-07-19 DIAGNOSIS — L89322 Pressure ulcer of left buttock, stage 2: Secondary | ICD-10-CM | POA: Diagnosis present

## 2024-07-19 DIAGNOSIS — K2961 Other gastritis with bleeding: Secondary | ICD-10-CM | POA: Diagnosis present

## 2024-07-19 DIAGNOSIS — I10 Essential (primary) hypertension: Secondary | ICD-10-CM | POA: Diagnosis not present

## 2024-07-19 DIAGNOSIS — K2970 Gastritis, unspecified, without bleeding: Secondary | ICD-10-CM

## 2024-07-19 DIAGNOSIS — N179 Acute kidney failure, unspecified: Secondary | ICD-10-CM | POA: Diagnosis not present

## 2024-07-19 DIAGNOSIS — L4050 Arthropathic psoriasis, unspecified: Secondary | ICD-10-CM | POA: Diagnosis present

## 2024-07-19 DIAGNOSIS — L89312 Pressure ulcer of right buttock, stage 2: Secondary | ICD-10-CM | POA: Diagnosis present

## 2024-07-19 DIAGNOSIS — R1310 Dysphagia, unspecified: Secondary | ICD-10-CM

## 2024-07-19 DIAGNOSIS — F419 Anxiety disorder, unspecified: Secondary | ICD-10-CM | POA: Diagnosis present

## 2024-07-19 DIAGNOSIS — K3189 Other diseases of stomach and duodenum: Secondary | ICD-10-CM

## 2024-07-19 DIAGNOSIS — I350 Nonrheumatic aortic (valve) stenosis: Secondary | ICD-10-CM | POA: Diagnosis present

## 2024-07-19 DIAGNOSIS — E119 Type 2 diabetes mellitus without complications: Secondary | ICD-10-CM

## 2024-07-19 DIAGNOSIS — K2990 Gastroduodenitis, unspecified, without bleeding: Secondary | ICD-10-CM | POA: Diagnosis not present

## 2024-07-19 DIAGNOSIS — J9811 Atelectasis: Secondary | ICD-10-CM | POA: Diagnosis present

## 2024-07-19 DIAGNOSIS — G4730 Sleep apnea, unspecified: Secondary | ICD-10-CM

## 2024-07-19 DIAGNOSIS — I110 Hypertensive heart disease with heart failure: Secondary | ICD-10-CM | POA: Diagnosis present

## 2024-07-19 DIAGNOSIS — K922 Gastrointestinal hemorrhage, unspecified: Secondary | ICD-10-CM | POA: Diagnosis not present

## 2024-07-19 DIAGNOSIS — I700 Atherosclerosis of aorta: Secondary | ICD-10-CM | POA: Diagnosis present

## 2024-07-19 DIAGNOSIS — R0602 Shortness of breath: Secondary | ICD-10-CM | POA: Diagnosis present

## 2024-07-19 DIAGNOSIS — K2960 Other gastritis without bleeding: Secondary | ICD-10-CM | POA: Diagnosis not present

## 2024-07-19 DIAGNOSIS — I2510 Atherosclerotic heart disease of native coronary artery without angina pectoris: Secondary | ICD-10-CM | POA: Diagnosis present

## 2024-07-19 DIAGNOSIS — N170 Acute kidney failure with tubular necrosis: Secondary | ICD-10-CM | POA: Diagnosis present

## 2024-07-19 DIAGNOSIS — Z7982 Long term (current) use of aspirin: Secondary | ICD-10-CM | POA: Diagnosis not present

## 2024-07-19 DIAGNOSIS — D62 Acute posthemorrhagic anemia: Secondary | ICD-10-CM | POA: Diagnosis present

## 2024-07-19 DIAGNOSIS — D500 Iron deficiency anemia secondary to blood loss (chronic): Secondary | ICD-10-CM | POA: Diagnosis not present

## 2024-07-19 DIAGNOSIS — Z7984 Long term (current) use of oral hypoglycemic drugs: Secondary | ICD-10-CM | POA: Diagnosis not present

## 2024-07-19 DIAGNOSIS — E872 Acidosis, unspecified: Secondary | ICD-10-CM | POA: Diagnosis present

## 2024-07-19 DIAGNOSIS — Z79899 Other long term (current) drug therapy: Secondary | ICD-10-CM | POA: Diagnosis not present

## 2024-07-19 DIAGNOSIS — Z6831 Body mass index (BMI) 31.0-31.9, adult: Secondary | ICD-10-CM | POA: Diagnosis not present

## 2024-07-19 DIAGNOSIS — R0609 Other forms of dyspnea: Secondary | ICD-10-CM | POA: Diagnosis not present

## 2024-07-19 DIAGNOSIS — F32A Depression, unspecified: Secondary | ICD-10-CM | POA: Diagnosis present

## 2024-07-19 DIAGNOSIS — K921 Melena: Secondary | ICD-10-CM | POA: Diagnosis not present

## 2024-07-19 DIAGNOSIS — E039 Hypothyroidism, unspecified: Secondary | ICD-10-CM | POA: Diagnosis present

## 2024-07-19 DIAGNOSIS — Z79890 Hormone replacement therapy (postmenopausal): Secondary | ICD-10-CM | POA: Diagnosis not present

## 2024-07-19 HISTORY — PX: ESOPHAGOGASTRODUODENOSCOPY: SHX5428

## 2024-07-19 HISTORY — PX: ESOPHAGEAL DILATION: SHX303

## 2024-07-19 LAB — CBC
HCT: 21.8 % — ABNORMAL LOW (ref 39.0–52.0)
HCT: 24.5 % — ABNORMAL LOW (ref 39.0–52.0)
Hemoglobin: 6.9 g/dL — CL (ref 13.0–17.0)
Hemoglobin: 8 g/dL — ABNORMAL LOW (ref 13.0–17.0)
MCH: 30.9 pg (ref 26.0–34.0)
MCH: 31.6 pg (ref 26.0–34.0)
MCHC: 31.7 g/dL (ref 30.0–36.0)
MCHC: 32.7 g/dL (ref 30.0–36.0)
MCV: 97.8 fL (ref 80.0–100.0)
MCV: 96.8 fL (ref 80.0–100.0)
Platelets: 182 K/uL (ref 150–400)
Platelets: 184 K/uL (ref 150–400)
RBC: 2.23 MIL/uL — ABNORMAL LOW (ref 4.22–5.81)
RBC: 2.53 MIL/uL — ABNORMAL LOW (ref 4.22–5.81)
RDW: 14.2 % (ref 11.5–15.5)
RDW: 14.4 % (ref 11.5–15.5)
WBC: 5.9 K/uL (ref 4.0–10.5)
WBC: 6.1 K/uL (ref 4.0–10.5)
nRBC: 0 % (ref 0.0–0.2)
nRBC: 0 % (ref 0.0–0.2)

## 2024-07-19 LAB — BASIC METABOLIC PANEL WITH GFR
Anion gap: 12 (ref 5–15)
BUN: 39 mg/dL — ABNORMAL HIGH (ref 8–23)
CO2: 20 mmol/L — ABNORMAL LOW (ref 22–32)
Calcium: 8.3 mg/dL — ABNORMAL LOW (ref 8.9–10.3)
Chloride: 108 mmol/L (ref 98–111)
Creatinine, Ser: 2.14 mg/dL — ABNORMAL HIGH (ref 0.61–1.24)
GFR, Estimated: 33 mL/min — ABNORMAL LOW (ref >60)
Glucose, Bld: 116 mg/dL — ABNORMAL HIGH (ref 70–99)
Potassium: 4.6 mmol/L (ref 3.5–5.1)
Sodium: 140 mmol/L (ref 135–145)

## 2024-07-19 LAB — PREPARE RBC (CROSSMATCH)

## 2024-07-19 LAB — HIV ANTIBODY (ROUTINE TESTING W REFLEX): HIV Screen 4th Generation wRfx: NONREACTIVE

## 2024-07-19 LAB — GLUCOSE, CAPILLARY: Glucose-Capillary: 104 mg/dL — ABNORMAL HIGH (ref 70–99)

## 2024-07-19 MED ORDER — LACTATED RINGERS IV SOLN: INTRAVENOUS | Status: DC | PRN

## 2024-07-19 MED ORDER — COLLAGENASE 250 UNIT/GM EX OINT
TOPICAL_OINTMENT | Freq: Every day | CUTANEOUS | Status: DC
  Filled 2024-07-19: qty 30

## 2024-07-19 MED ORDER — SODIUM CHLORIDE 0.9 % IV SOLN: INTRAVENOUS | Status: DC

## 2024-07-19 MED ORDER — LACTATED RINGERS IV SOLN: INTRAVENOUS | Status: DC

## 2024-07-19 MED ORDER — PROPOFOL 10 MG/ML IV BOLUS
INTRAVENOUS | Status: DC | PRN
  Administered 2024-07-19: 125 ug/kg/min via INTRAVENOUS
  Administered 2024-07-19: 70 mg via INTRAVENOUS

## 2024-07-19 MED ORDER — LACTATED RINGERS IV SOLN: INTRAVENOUS | Status: AC

## 2024-07-19 MED ORDER — SODIUM CHLORIDE 0.9% IV SOLUTION: Freq: Once | INTRAVENOUS | Status: AC

## 2024-07-19 MED ORDER — LIDOCAINE 2% (20 MG/ML) 5 ML SYRINGE
INTRAMUSCULAR | Status: DC | PRN
  Administered 2024-07-19: 60 mg via INTRAVENOUS
  Administered 2024-07-19: 40 mg via INTRAVENOUS

## 2024-07-19 NOTE — Brief Op Note (Signed)
 Patient underwent EGD under propofol  sedation.  Tolerated the procedure adequately.   FINDINGS:  - No endoscopic esophageal abnormality to explain patient' s dysphagia. Esophagus dilated. - Localized Congested, erythematous and friable ( with spontaneous bleeding) mucosa in the greater curvature. Biopsied x8 rule out malignancy. Treated with argon plasma coagulation ( APC) . - Erosive gastropathy with no bleeding and no stigmata of recent bleeding. - Normal duodenal bulb and second portion of the duodenum.  RECOMMENDATIONS  Advance diet as tolerated   PPI BID for 8 weeks   Follow up path ; if biopsies are non- diagnostic would recommend repeat upper endoscopy closely ( 3- 4weeks) to assess healing of abnormal mucosa and rule out maligancy as patient has history of Intestinal metaplasia   CBC q12 and Tranfuse to keep > 7   Demosthenes Virnig Faizan Ghazi Rumpf, MD Gastroenterology and Hepatology [facility]

## 2024-07-19 NOTE — Interval H&P Note (Signed)
 History and Physical Interval Note:  07/19/2024 2:30 PM  Jonathan Grimes  has presented today for surgery, with the diagnosis of weight loss, acute on chronic anemia, dysphagia, early satiety, possible melena.  The various methods of treatment have been discussed with the patient and family. After consideration of risks, benefits and other options for treatment, the patient has consented to  Procedure(s): EGD (ESOPHAGOGASTRODUODENOSCOPY) (N/A) DILATION, ESOPHAGUS (N/A) as a surgical intervention.  The patient's history has been reviewed, patient examined, no change in status, stable for surgery.  I have reviewed the patient's chart and labs.  Questions were answered to the patient's satisfaction.    I thoroughly discussed with the patient the procedure, including the risks involved. Patient understands what the procedure involves including the benefits and any risks. Patient understands alternatives to the proposed procedure. Risks including (but not limited to) bleeding, tearing of the lining (perforation), rupture of adjacent organs, problems with heart and lung function, infection, and medication reactions. A small percentage of complications may require surgery, hospitalization, repeat endoscopic procedure, and/or transfusion.  Patient understood and agreed.     Deatrice FALCON Addasyn Mcbreen

## 2024-07-19 NOTE — Anesthesia Preprocedure Evaluation (Addendum)
 Anesthesia Evaluation  Patient identified by MRN, date of birth, ID band Patient awake    Reviewed: Allergy & Precautions, H&P , NPO status , Patient's Chart, lab work & pertinent test results, reviewed documented beta blocker date and time   Airway Mallampati: III  TM Distance: >3 FB Neck ROM: full    Dental no notable dental hx. (+) Dental Advisory Given, Teeth Intact   Pulmonary shortness of breath, sleep apnea , former smoker   Pulmonary exam normal breath sounds clear to auscultation       Cardiovascular Exercise Tolerance: Good hypertension, + DOE  Normal cardiovascular exam+ Valvular Problems/Murmurs AS  Rhythm:regular Rate:Normal  Good EF. Mild to mod AS. Grade 1 diastolic dysfunction   Neuro/Psych  Neuromuscular disease  negative psych ROS   GI/Hepatic Neg liver ROS, hiatal hernia, PUD,GERD  ,,Fatty liver Chronic and Acute GI bleeding   Endo/Other  diabetes, Type 2    Renal/GU ARFRenal disease  negative genitourinary   Musculoskeletal  (+) Arthritis , Osteoarthritis,    Abdominal   Peds  Hematology  (+) Blood dyscrasia, anemia   Anesthesia Other Findings   Reproductive/Obstetrics negative OB ROS                              Anesthesia Physical Anesthesia Plan  ASA: 4  Anesthesia Plan: General   Post-op Pain Management: Minimal or no pain anticipated   Induction:   PONV Risk Score and Plan: Propofol  infusion  Airway Management Planned: Natural Airway and Nasal Cannula  Additional Equipment: None  Intra-op Plan:   Post-operative Plan:   Informed Consent: I have reviewed the patients History and Physical, chart, labs and discussed the procedure including the risks, benefits and alternatives for the proposed anesthesia with the patient or authorized representative who has indicated his/her understanding and acceptance.     Dental Advisory Given  Plan Discussed  with: CRNA  Anesthesia Plan Comments:         Anesthesia Quick Evaluation

## 2024-07-19 NOTE — Plan of Care (Signed)

## 2024-07-19 NOTE — Transfer of Care (Addendum)
 Immediate Anesthesia Transfer of Care Note  Patient: Jonathan Grimes  Procedure(s) Performed: EGD (ESOPHAGOGASTRODUODENOSCOPY) DILATION, ESOPHAGUS  Patient Location: PACU  Anesthesia Type:General  Level of Consciousness: drowsy and patient cooperative  Airway & Oxygen Therapy: Patient Spontanous Breathing  Post-op Assessment: Report given to RN and Post -op Vital signs reviewed and stable  Post vital signs: Reviewed and stable  Last Vitals:  Vitals Value Taken Time  BP 129/61 07/19/24   15:46  Temp 36.3 07/19/24   15:46  Pulse 95 07/19/24 15:47  Resp 23 07/19/24 15:47  SpO2 91 % 07/19/24 15:47  Vitals shown include unfiled device data.  Last Pain:  Vitals:   07/19/24 1518  TempSrc:   PainSc: 0-No pain      Patients Stated Pain Goal: 8 (07/19/24 1413)  Complications: No notable events documented.

## 2024-07-19 NOTE — Anesthesia Procedure Notes (Signed)
 Date/Time: 07/19/2024 3:17 PM  Performed by: Para Jerelene CROME, CRNAOxygen Delivery Method: Nasal cannula Comments: OptiFlow Nasal Cannula.

## 2024-07-19 NOTE — Progress Notes (Signed)
 Consent for esophagogastroduodenoscopy with possible esophageal dilation  signed and placed in Pt paper chart.

## 2024-07-19 NOTE — Progress Notes (Signed)
 Evaluated patient this morning. He is feeling much better with regards to fatigue and SOB. He has received one unit of prbcs this morning due to Hgb of 6.9. He denies chest pain. No lower extremity edema. No brbpr. As outpatient he reports black stools insetting of iron, new onset dysphagia, early satiety, weight loss. Colonoscopy in 12/2023. EGD in 2017. History of oral diclofenac use. Plans for EGD but required admission yesterday to be optimized from cardiopulmonary standpoint.   Newly diagnosed mild to moderate aortic stenosis outpatient ECHO couple of weeks ago, with plans for referral for outpatient cardiology but not seen.    Worsening DOE recently likely exacerbated by worsening anemia. Hgb dropped from 10.2 (6 months ago) to 6.9 today. New acute increase in Creatinine, has seen nephrology today.   Cardiologist, Dr. Alvan, reviewed his chart yesterday and discussed with attending/GI, does not see any cardiac reasons to prevent endoscopic evaluation. Per Dr. Alvan, Echo with mild diastolic dysfunction, mild to moderate AS (should not cause symptoms), tropinins flat and likely due to anemia, no findings to suggest ACS.    Seen briefly today: BP 134/77, Hr 74, O2 94% on RA. Heart exam, RRR. Lung exam CTA bilaterally. Abd exam nontender, nondistended. LE without edema.  Needs diagnostic EGD today. I thoroughly discussed with the patient the procedure, including the risks involved. Patient understands what the procedure involves including the benefits and any risks. Patient understands alternatives to the proposed procedure. Risks including (but not limited to) bleeding, tearing of the lining (perforation), rupture of adjacent organs, problems with heart and lung function, infection, and medication reactions. A small percentage of complications may require surgery, hospitalization, repeat endoscopic procedure, and/or transfusion. Patient understood and agreed.            Jonathan Grimes. Jonathan Grimes [facility] [phone] 11/20/20257:59 AM

## 2024-07-19 NOTE — Progress Notes (Signed)
 PROGRESS NOTE    Jonathan Grimes  FMW:[id number] DOB: [birth year] DOA: 07/18/2024 PCP: Marvine Rush, MD    Brief Narrative:  68 year old man with subacute hematochezia attributed to hemorrhoids, psoriatic arthritis on Enbrel admitted for dyspnea.  Review of systems is notable for early satiety and a 30 pound weight loss over the past 11 months.  Laboratory workup notable for progressive decrease in hemoglobin, kidney dysfunction of unclear duration.   Assessment and Plan: Dyspnea Orthopnea Echocardiogram from last month with normal EF and diastolic parameters, with mild to moderate AS with mean gradient of 20 CT scan of lungs:  Subpleural reticulation and parenchymal banding within the posterior lung bases are nonspecific but may be seen in the setting of chronic interstitial lung disease. 3. Partial atelectasis and volume loss involving the basilar right middle lobe. 4. 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 IS   Kidney dysfunction of unclear duration Per chart review, patient had creatinine 1.2 in July and 1.05 in May Urine is relatively bland -nephrology consult appreciated:  Will check renal US  and continue to follow UOP and renal function.  Urine studies pending and will repeat UA.  No uremic symptoms or indications for dialysis at this time.  Would hold off on kidney biopsy as well and continue to follow Scr.    30 pound weight loss since January Early satiety Anemia secondary to ABLA and IDA Ongoing hemorrhoidal bleeding s/p banding H&H with downward trend over the past several months Patient is being actively followed by GI who are planning for EGD 11/20 Hold iron until GI workup is complete -1 unit PRBC on 11/20   Gout Polyarticular psoriatic arthritis  Polyarthralgia  Followed by  [facility] rheumatology on Enbrel and allopurinol  Patient on allopurinol  200 mg daily, will decrease to 100 mg daily given GFR 24 Hold Enbrel    HTN -holding BP meds -resume as indicated   Elevated alk phos Alk phos 176 in July 2025, now elevated to 251 Other liver function tests are normal Possible bone source of elevated alkaline phosphatase Depending on results of EGD, may need skeletal survey   Hypothyroidism -synthroid    DM 2 Hold metformin  Carb modified diet  Obesity Estimated body mass index is 31.76 kg/m as calculated from the following:   Height as of this encounter: 6' 1 (1.854 m).   Weight as of this encounter: 109.2 kg.    Anxiety and depression Continue escitalopram    Pressure ulcers: bilateral buttocks, stage 2 and unstageable with sloughing, evidence for deep tissue pressure injuries as well.  -RN to place pictures in chart -WOC consult    DVT prophylaxis: SCDs Start: 07/18/24 1424    Code Status: Full Code Family Communication: at bedside  Disposition Plan:  Level of care: Telemetry Status is: Observation   Consultants:  GI renal   Subjective: Agreeable to blood transfusion  Objective: Vitals:   07/19/24 0402 07/19/24 0723 07/19/24 0738 07/19/24 1125  BP: 126/73 121/78 134/77 129/80  Pulse: 82 81 74 80  Resp: 20 (!) 24    Temp: 98.3 F (36.8 C) 98.4 F (36.9 C) 98.8 F (37.1 C) 98.2 F (36.8 C)  TempSrc: Oral Oral Oral Oral  SpO2: 97% 92% 94% 96%  Weight:      Height:        Intake/Output Summary (Last 24 hours) at 07/19/2024 1130 Last data filed at 07/19/2024 1125 Gross per 24 hour  Intake 1758.31 ml  Output --  Net 1758.31 ml  Filed Weights   07/18/24 1721  Weight: 109.2 kg    Examination:   General: Appearance:    Obese male in no acute distress- pale appearing     Lungs:      respirations unlabored  Heart:    Normal heart rate. Normal rhythm.     MS:   All extremities are intact.    Neurologic:   Awake, alert       Data Reviewed: I have personally reviewed following labs and imaging studies  CBC: Recent Labs  Lab 07/18/24 1105  07/19/24 0415  WBC 8.3 5.9  NEUTROABS 6.1  --   HGB 8.0* 6.9*  HCT 25.8* 21.8*  MCV 98.5 97.8  PLT 204 182   Basic Metabolic Panel: Recent Labs  Lab 07/18/24 1105 07/19/24 0415  NA 137 140  K 5.0 4.6  CL 103 108  CO2 17* 20*  GLUCOSE 144* 116*  BUN 48* 39*  CREATININE 2.76* 2.14*  CALCIUM  8.8* 8.3*   GFR: Estimated Creatinine Clearance: 42.2 mL/min (A) (by C-G formula based on SCr of 2.14 mg/dL (H)). Liver Function Tests: Recent Labs  Lab 07/18/24 1105  AST 33  ALT 21  ALKPHOS 251*  BILITOT 0.3  PROT 7.3  ALBUMIN 3.4*   No results for input(s): LIPASE, AMYLASE in the last 168 hours. No results for input(s): AMMONIA in the last 168 hours. Coagulation Profile: No results for input(s): INR, PROTIME in the last 168 hours. Cardiac Enzymes: No results for input(s): CKTOTAL, CKMB, CKMBINDEX, TROPONINI in the last 168 hours. BNP (last 3 results) Recent Labs    07/18/24 1105  PROBNP 1,394.0*   HbA1C: No results for input(s): HGBA1C in the last 72 hours. CBG: No results for input(s): GLUCAP in the last 168 hours. Lipid Profile: No results for input(s): CHOL, HDL, LDLCALC, TRIG, CHOLHDL, LDLDIRECT in the last 72 hours. Thyroid  Function Tests: No results for input(s): TSH, T4TOTAL, FREET4, T3FREE, THYROIDAB in the last 72 hours. Anemia Panel: No results for input(s): VITAMINB12, FOLATE, FERRITIN, TIBC, IRON, RETICCTPCT in the last 72 hours. Sepsis Labs: No results for input(s): PROCALCITON, LATICACIDVEN in the last 168 hours.  No results found for this or any previous visit (from the past 240 hours).       Radiology Studies: CT CHEST WO CONTRAST Result Date: 07/18/2024 EXAM: CT CHEST WITHOUT CONTRAST 07/18/2024 04:07:43 PM TECHNIQUE: CT of the chest was performed without the administration of intravenous contrast. Multiplanar reformatted images are provided for review. Automated exposure control,  iterative reconstruction, and/or weight based adjustment of the mA/kV was utilized to reduce the radiation dose to as low as reasonably achievable. COMPARISON: None available. CLINICAL HISTORY: Dyspnea, chronic, unclear etiology. FINDINGS: MEDIASTINUM: Heart size is at the upper limits of normal. Aortic atherosclerosis. The main pulmonary artery measures 3.3 cm (image 68/2). Three-vessel coronary artery calcifications. Aortic and mitral valve calcifications. No pericardial effusion. The central airways are patent. LYMPH NODES: No mediastinal, hilar or axillary lymphadenopathy. LUNGS AND PLEURA: Bilateral subpleural interstitial reticulation and subpleural banding identified within the posterior lung bases. There is partial atelectasis and volume loss involving the basilar portions of the right middle lobe. No pleural effusion or pneumothorax. No interstitial edema or airspace consolidation. SOFT TISSUES/BONES: Mild degenerative changes within the thoracic spine. Remote left lateral 8th rib fracture. No acute abnormality of the soft tissues. UPPER ABDOMEN: Limited images of the upper abdomen demonstrates no acute abnormality. IMPRESSION: 1. No acute cardiopulmonary abnormality identified. 2. Subpleural reticulation and parenchymal banding within the  posterior lung bases are nonspecific but may be seen in the setting of chronic interstitial lung disease. 3. Partial atelectasis and volume loss involving the basilar right middle lobe. 4. 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 5. Aortic atherosclerosis and 3-vessel coronary artery calcifications. Electronically signed by: Waddell Calk MD 07/18/2024 04:36 PM EST RP Workstation: [id number]   DG Chest 2 View Result Date: 07/18/2024 EXAM: 2 VIEW(S) XRAY OF THE CHEST 07/18/2024 11:07:00 AM COMPARISON: None available. CLINICAL HISTORY: SOB FINDINGS: LUNGS AND PLEURA: Asymmetric elevation of right hemidiaphragm. No focal pulmonary  opacity. No pleural effusion. No pneumothorax. HEART AND MEDIASTINUM: No acute abnormality of the cardiac and mediastinal silhouettes. BONES AND SOFT TISSUES: Thoracic degenerative changes. No acute osseous abnormality. IMPRESSION: 1. Asymmetric elevation of the right hemidiaphragm. 2. No acute findings. Electronically signed by: Norleen Boxer MD 07/18/2024 11:32 AM EST RP Workstation: [id number]        Scheduled Meds:  allopurinol  100 mg Oral Daily   atorvastatin   10 mg Oral Daily   escitalopram  10 mg Oral Daily   fluticasone  2 spray Each Nare Daily   levothyroxine  50 mcg Oral Daily   loratadine  10 mg Oral Daily   pantoprazole   40 mg Oral Daily   Continuous Infusions:  lactated ringers  125 mL/hr at 07/18/24 1717     LOS: 0 days    Time spent: 45 minutes spent on chart review, discussion with nursing staff, consultants, updating family and interview/physical exam; more than 50% of that time was spent in counseling and/or coordination of care.    Harlene RAYMOND Bowl, DO [facility] Available via Epic secure chat 7am-7pm After these hours, please refer to coverage provider listed on [email or web address] 07/19/2024, 11:30 AM

## 2024-07-19 NOTE — Op Note (Signed)
 [facility] Patient Name: Jonathan Grimes Procedure Date: 07/19/2024 3:08 PM MRN: [id number] Date of Birth: [birth year] Attending MD: Deatrice Dine , MD, [id number] CSN: [id number] Age: 68 Admit Type: Inpatient Procedure:                Upper GI endoscopy Indications:              Acute post hemorrhagic anemia, Unexplained iron                            deficiency anemia, Dysphagia, Weight loss Providers:                Deatrice Dine, MD, Jon LABOR. Gerome RN, RN, Bascom Blush Referring MD:              Medicines:                Monitored Anesthesia Care Complications:            No immediate complications. Estimated Blood Loss:     Estimated blood loss was minimal. Procedure:                Pre-Anesthesia Assessment:                           - Prior to the procedure, a History and Physical                            was performed, and patient medications and                            allergies were reviewed. The patient's tolerance of                            previous anesthesia was also reviewed. The risks                            and benefits of the procedure and the sedation                            options and risks were discussed with the patient.                            All questions were answered, and informed consent                            was obtained. Prior Anticoagulants: The patient has                            taken no anticoagulant or antiplatelet agents                            except for aspirin . ASA Grade Assessment: III - A  patient with severe systemic disease. After                            reviewing the risks and benefits, the patient was                            deemed in satisfactory condition to undergo the                            procedure.                           After obtaining informed consent, the endoscope was                            passed under direct vision.  Throughout the                            procedure, the patient's blood pressure, pulse, and                            oxygen saturations were monitored continuously. The                            [id number] ([id number]) Upper was introduced through                            the mouth, and advanced to the second part of                            duodenum. The upper GI endoscopy was accomplished                            without difficulty. The patient tolerated the                            procedure well. Scope In: 3:24:44 PM Scope Out: 3:40:11 PM Total Procedure Duration: 0 hours 15 minutes 27 seconds  Findings:      No endoscopic abnormality was evident in the esophagus to explain the       patient's complaint of dysphagia. It was decided, however, to proceed       with dilation of the entire esophagus. A TTS dilator was passed through       the scope. Dilation with a 15-16.5-18 mm balloon dilator was performed       to 18 mm. The dilation site was examined following endoscope reinsertion       and showed mild mucosal disruption.      Localized mucosal changes characterized by congestion, erythema and       friability (with spontaneous bleeding) were found on the greater       curvature of the stomach. Biopsies were taken with a cold forceps for       histology. Coagulation for hemostasis using argon plasma at 0.3       liters/minute and 30 watts was successful.      A few erosions with no bleeding  and no stigmata of recent bleeding were       found in the gastric antrum.      The duodenal bulb and second portion of the duodenum were normal. Impression:               - No endoscopic esophageal abnormality to explain                            patient's dysphagia. Esophagus dilated.                           - Localized Congested, erythematous and friable                            (with spontaneous bleeding) mucosa in the greater                            curvature. Biopsied x8  rule out malignancy. Treated                            with argon plasma coagulation (APC).                           - Erosive gastropathy with no bleeding and no                            stigmata of recent bleeding.                           - Normal duodenal bulb and second portion of the                            duodenum. Moderate Sedation:      Per Anesthesia Care Recommendation:           Advance diet as tolerated                           PPI BID for 8 weeks                           Follow up path ; if biopsies are non-diagnostic                            would recommend repeat upper endoscopy closely                            (3-4weeks) to assess healing of abnormal mucosa and                            rule out maligancy as patient has history of                            Intestinal metaplasia                           CBC q12 and Tranfuse  to keep >7 Procedure Code(s):        --- Professional ---                           [phone], 59, Esophagogastroduodenoscopy, flexible,                            transoral; with control of bleeding, any method                           43239, Esophagogastroduodenoscopy, flexible,                            transoral; with biopsy, single or multiple Diagnosis Code(s):        --- Professional ---                           R13.10, Dysphagia, unspecified                           K31.89, Other diseases of stomach and duodenum                           K92.2, Gastrointestinal hemorrhage, unspecified                           D62, Acute posthemorrhagic anemia                           D50.9, Iron deficiency anemia, unspecified                           R63.4, Abnormal weight loss CPT copyright 2022 [employer]. All rights reserved. The codes documented in this report are preliminary and upon coder review may  be revised to meet current compliance requirements. Deatrice Dine, MD Deatrice Dine, MD 07/19/2024 3:51:13  PM This report has been signed electronically. Number of Addenda: 0

## 2024-07-19 NOTE — Progress Notes (Signed)
  Transition of Care [facility]) Screening Note   Patient Details  Name: Jonathan Grimes Date of Birth: [birth year]   Transition of Care [facility]) CM/SW Contact:    Hoy DELENA Bigness, LCSW Phone Number: 07/19/2024, 9:31 AM    Transition of Care Department [facility]) has reviewed patient and no TOC needs have been identified at this time. We will continue to monitor patient advancement through interdisciplinary progression rounds. If new patient transition needs arise, please place a TOC consult.    07/19/24 0931  TOC Brief Assessment  Insurance and Status Reviewed  Patient has primary care physician Yes  Home environment has been reviewed From home with spouse  Prior level of function: Independent  Prior/Current Home Services No current home services  Social Drivers of Health Review SDOH reviewed no interventions necessary  Readmission risk has been reviewed Yes  Transition of care needs no transition of care needs at this time

## 2024-07-19 NOTE — Consult Note (Signed)
 Reason for Consult: AKI Referring Physician:  Juvenal, DO  Jonathan Grimes is an 68 y.o. male has a PMH significant for HTN, DM type 2, HLD, fatty liver, DJD, GERD, psoriatic arthritis (on Enbrel and allopurinol ), diverticulosis, internal hemorrhoids (s/p banding), Etoh use, OSA, and mild-mod aortic stenosis who was noted to have a 1 month history of worsening fatigue, DOE, and worsening anemia.  He was sent to the ED from GI clinic on 07/18/24.  In the ED, Temp 97.7, bp 138/87, HR 97, RR 22, SpO2 94%.  Labs notable for Hgb 8, BUN 48, Cr 2.76, Co2 17, alb 3.4, [id number].  CXR with asymmetric elevation of right hemidiaphragm.  CT scan of chest without acute cardiopulmonary abnormality.  Partial atelectasis and volume loss involving the basilar right middle lobe.  Aortic atherosclerosis and 3 vessel CAD calcifications.  Repeat Hgb this morning 6.9.  He was admitted for symptomatic anemia and we were consulted to further evaluate and manage his AKI.  The trend in Scr is seen below.  UA without hematuria, proteinuria, nitrite negative with small leukocytes.  Of note, he had been on dicolfenac 75 mg bid and losartan  100 mg daily.  He also reports diarrhea for the past 2 months.  He denies any h/o nephrolithiasis and no family history of CKD.  He denies any dysuria, pyuria, hematuria, urgency, frequency, or retention but does have nocturia x 5.  Trend in Creatinine: Creatinine, Ser  Date/Time Value Ref Range Status  07/19/2024 04:15 AM 2.14 (H) 0.61 - 1.24 mg/dL Final  88/80/7974 88:94 AM 2.76 (H) 0.61 - 1.24 mg/dL Final  92/78/7974 8.75    01/02/2024 09:58 AM 1.05 0.61 - 1.24 mg/dL Final  88/87/7983 95:49 AM 0.66 0.61 - 1.24 mg/dL Final  88/95/7983 90:99 AM 0.74 0.61 - 1.24 mg/dL Final  93/72/7984 94:87 AM 0.67 0.50 - 1.35 mg/dL Final  93/81/7984 98:69 PM 0.61 0.50 - 1.35 mg/dL Final  88/69/7986 94:69 AM 0.72 0.50 - 1.35 mg/dL Final  88/77/7986 87:84 PM 0.83 0.50 - 1.35 mg/dL Final  87/84/7988 98:98 PM  0.85 0.4 - 1.5 mg/dL Final    PMH:   Past Medical History:  Diagnosis Date   Arthritis    SEVERE OA LEFT HIP WITH PAIN   Diabetes mellitus without complication (HCC)    type 2   Elevated cholesterol    Fatty liver    GERD (gastroesophageal reflux disease)    H/O hiatal hernia    Hiatal hernia    Hypertension    Schatzki's ring    Sleep apnea    Tubular adenoma     PSH:   Past Surgical History:  Procedure Laterality Date   BIOPSY N/A 10/02/2015   Procedure: BIOPSY;  Surgeon: Lamar CHRISTELLA Hollingshead, MD;  Location: [facility];  Service: Endoscopy;  Laterality: N/A;  Gastric bx   COLONOSCOPY  01/19/2010   Dr.Rourk- friable anorectal. o/w normal rectum, L sided diverticula, sigmoid polyp bx= tubular adenoma,   COLONOSCOPY N/A 10/02/2015   MFM:wnmfjo appearing rectal/scattered sigmoid diverticula, next TCS five years for h/o colon adenomas   COLONOSCOPY N/A 01/04/2024   Procedure: COLONOSCOPY;  Surgeon: Hollingshead Lamar CHRISTELLA, MD;  Location: [facility];  Service: Endoscopy;  Laterality: N/A;  730AM, ASA 3   COLONOSCOPY WITH PROPOFOL  N/A 01/04/2022   Procedure: COLONOSCOPY WITH PROPOFOL ;  Surgeon: Hollingshead Lamar CHRISTELLA, MD;  Location: [facility];  Service: Endoscopy;  Laterality: N/A;  7:30 / ASA 2   ESOPHAGOGASTRODUODENOSCOPY  01/19/2010   Dr.Rourk- noncritical  schatzki's ring, o/w normal esophagus, small hiatal hernia o/w normal stomach D1, D2   ESOPHAGOGASTRODUODENOSCOPY N/A 10/02/2015   MFM:jwumjo erosiona s/p bx (no h.pylri, gastric mucosa with intestinal metaplasia)   GIVENS CAPSULE STUDY N/A 11/21/2015   Procedure: GIVENS CAPSULE STUDY;  Surgeon: Lamar CHRISTELLA Hollingshead, MD;  Location: [facility];  Service: Endoscopy;  Laterality: N/A;  0700   JOINT REPLACEMENT  08/30/2010   RIGHT TOTAL KNEE ARTHROPLASTY   TOTAL HIP ARTHROPLASTY  07/28/2012   Procedure: TOTAL HIP ARTHROPLASTY ANTERIOR APPROACH;  Surgeon: Lonni CINDERELLA Poli, MD;  Location: [facility];  Service: Orthopedics;  Laterality: Left;    TOTAL HIP ARTHROPLASTY Right 02/22/2014   Procedure: RIGHT TOTAL HIP ARTHROPLASTY ANTERIOR APPROACH;  Surgeon: Lonni CINDERELLA Poli, MD;  Location: [facility];  Service: Orthopedics;  Laterality: Right;   TOTAL KNEE REVISION Right 07/11/2015   Procedure: REVISION RIGHT TOTAL KNEE ARTHROPLASTYone component only;  Surgeon: Lonni CINDERELLA Poli, MD;  Location: [facility];  Service: Orthopedics;  Laterality: Right;    Allergies: No Known Allergies  Medications:   Prior to Admission medications   Medication Sig Start Date End Date Taking? Authorizing Provider  allopurinol (ZYLOPRIM) 100 MG tablet Take 100 mg by mouth daily. Patient taking differently: Take 200 mg by mouth daily.   Yes [provider]  atorvastatin  (LIPITOR) 10 MG tablet 1 tablet Orally Once a day for 30 day(s)   Yes [provider]  escitalopram (LEXAPRO) 10 MG tablet Take 10 mg by mouth daily. 09/13/23  Yes [provider]  fluticasone (FLONASE) 50 MCG/ACT nasal spray Place 2 sprays into both nostrils daily. 07/10/24  Yes Hedges, Reyes, PA-C  levothyroxine (SYNTHROID) 50 MCG tablet Take 50 mcg by mouth daily. 07/05/24  Yes [provider]  loratadine (CLARITIN) 10 MG tablet Take 1 tablet (10 mg total) by mouth daily. 07/10/24  Yes Hedges, Reyes, PA-C  omeprazole (PRILOSEC) 40 MG capsule Take 40 mg by mouth daily.   Yes [provider]    Inpatient medications:  allopurinol  100 mg Oral Daily   atorvastatin   10 mg Oral Daily   escitalopram  10 mg Oral Daily   fluticasone  2 spray Each Nare Daily   levothyroxine  50 mcg Oral Daily   loratadine  10 mg Oral Daily   pantoprazole   40 mg Oral Daily    Discontinued Meds:   Medications Discontinued During This Encounter  Medication Reason   amLODipine (NORVASC) 10 MG tablet Patient Preference   furosemide (LASIX) injection 20 mg    aspirin  EC 81 MG tablet    losartan  (COZAAR ) 100 MG tablet    diclofenac (VOLTAREN) 75 MG EC tablet     ENBREL SURECLICK 50 MG/ML injection    ferrous sulfate  325 (65 FE) MG tablet    metFORMIN  (GLUCOPHAGE ) 1000 MG tablet    nystatin -triamcinolone  ointment (MYCOLOG)    allopurinol (ZYLOPRIM) tablet 200 mg     Social History:  reports that he quit smoking about 42 years ago. His smoking use included cigarettes. His smokeless tobacco use includes chew. He reports current alcohol use. He reports that he does not use drugs.  Family History:  History reviewed. No pertinent family history.  Pertinent items are noted in HPI. Weight change:   Intake/Output Summary (Last 24 hours) at 07/19/2024 0808 Last data filed at 07/19/2024 0336 Gross per 24 hour  Intake 1274.31 ml  Output --  Net 1274.31 ml   BP 134/77   Pulse 74   Temp  98.8 F (37.1 C) (Oral)   Resp (!) 24   Ht 6' 1 (1.854 m)   Wt 109.2 kg   SpO2 94%   BMI 31.76 kg/m  Vitals:   07/19/24 0020 07/19/24 0402 07/19/24 0723 07/19/24 0738  BP: 111/64 126/73 121/78 134/77  Pulse: 88 82 81 74  Resp: 18 20 (!) 24   Temp: 98.2 F (36.8 C) 98.3 F (36.8 C) 98.4 F (36.9 C) 98.8 F (37.1 C)  TempSrc: Oral Oral Oral Oral  SpO2: 93% 97% 92% 94%  Weight:      Height:         General appearance: alert, cooperative, no distress, and pale Head: Normocephalic, without obvious abnormality, atraumatic Eyes: negative findings: lids and lashes normal, conjunctivae and sclerae normal, and corneas clear Resp: clear to auscultation bilaterally Cardio: regular rate and rhythm and II/VI SEM at LUSB GI: soft, non-tender; bowel sounds normal; no masses,  no organomegaly Extremities: extremities normal, atraumatic, no cyanosis or edema  Labs: Basic Metabolic Panel: Recent Labs  Lab 07/18/24 1105 07/19/24 0415  NA 137 140  K 5.0 4.6  CL 103 108  CO2 17* 20*  GLUCOSE 144* 116*  BUN 48* 39*  CREATININE 2.76* 2.14*  ALBUMIN 3.4*  --   CALCIUM  8.8* 8.3*   Liver Function Tests: Recent Labs  Lab 07/18/24 1105  AST 33  ALT 21   ALKPHOS 251*  BILITOT 0.3  PROT 7.3  ALBUMIN 3.4*   No results for input(s): LIPASE, AMYLASE in the last 168 hours. No results for input(s): AMMONIA in the last 168 hours. CBC: Recent Labs  Lab 07/18/24 1105 07/19/24 0415  WBC 8.3 5.9  NEUTROABS 6.1  --   HGB 8.0* 6.9*  HCT 25.8* 21.8*  MCV 98.5 97.8  PLT 204 182   PT/INR: @LABRCNTIP (inr:5) Cardiac Enzymes: )No results for input(s): CKTOTAL, CKMB, CKMBINDEX, TROPONINI in the last 168 hours. CBG: No results for input(s): GLUCAP in the last 168 hours.  Iron Studies: No results for input(s): IRON, TIBC, TRANSFERRIN, FERRITIN in the last 168 hours.  Xrays/Other Studies: CT CHEST WO CONTRAST Result Date: 07/18/2024 EXAM: CT CHEST WITHOUT CONTRAST 07/18/2024 04:07:43 PM TECHNIQUE: CT of the chest was performed without the administration of intravenous contrast. Multiplanar reformatted images are provided for review. Automated exposure control, iterative reconstruction, and/or weight based adjustment of the mA/kV was utilized to reduce the radiation dose to as low as reasonably achievable. COMPARISON: None available. CLINICAL HISTORY: Dyspnea, chronic, unclear etiology. FINDINGS: MEDIASTINUM: Heart size is at the upper limits of normal. Aortic atherosclerosis. The main pulmonary artery measures 3.3 cm (image 68/2). Three-vessel coronary artery calcifications. Aortic and mitral valve calcifications. No pericardial effusion. The central airways are patent. LYMPH NODES: No mediastinal, hilar or axillary lymphadenopathy. LUNGS AND PLEURA: Bilateral subpleural interstitial reticulation and subpleural banding identified within the posterior lung bases. There is partial atelectasis and volume loss involving the basilar portions of the right middle lobe. No pleural effusion or pneumothorax. No interstitial edema or airspace consolidation. SOFT TISSUES/BONES: Mild degenerative changes within the thoracic spine. Remote left  lateral 8th rib fracture. No acute abnormality of the soft tissues. UPPER ABDOMEN: Limited images of the upper abdomen demonstrates no acute abnormality. IMPRESSION: 1. No acute cardiopulmonary abnormality identified. 2. Subpleural reticulation and parenchymal banding within the posterior lung bases are nonspecific but may be seen in the setting of chronic interstitial lung disease. 3. Partial atelectasis and volume loss involving the basilar right middle lobe. 4. Increased caliber of  the main pulmonary artery concerning for PA hypertension. 5. Aortic atherosclerosis and 3-vessel coronary artery calcifications. Electronically signed by: Waddell Calk MD 07/18/2024 04:36 PM EST RP Workstation: [id number]   DG Chest 2 View Result Date: 07/18/2024 EXAM: 2 VIEW(S) XRAY OF THE CHEST 07/18/2024 11:07:00 AM COMPARISON: None available. CLINICAL HISTORY: SOB FINDINGS: LUNGS AND PLEURA: Asymmetric elevation of right hemidiaphragm. No focal pulmonary opacity. No pleural effusion. No pneumothorax. HEART AND MEDIASTINUM: No acute abnormality of the cardiac and mediastinal silhouettes. BONES AND SOFT TISSUES: Thoracic degenerative changes. No acute osseous abnormality. IMPRESSION: 1. Asymmetric elevation of the right hemidiaphragm. 2. No acute findings. Electronically signed by: Norleen Boxer MD 07/18/2024 11:32 AM EST RP Workstation: [id number]     Assessment/Plan:   AKI - possibly related to ischemic ATN in setting of ABLA and concomitant ARB therapy +/- NSAID use.  Also on DDx would be interstitial nephritis due to PPI.  Continue to hold ARB for now.  BUN/Cr did improve after IVF's from 2.74 to 2.14 today.  Hopefully should further improve with blood transfusion.  Given lack of active urine sediment, will hold off on GN workup.  Will check renal US  and continue to follow UOP and renal function.  Urine studies pending and will repeat UA.  No uremic symptoms or indications for dialysis at this time.  Would hold off on  kidney biopsy as well and continue to follow Scr. Avoid nephrotoxic medications including NSAIDs and iodinated intravenous contrast exposure unless the latter is absolutely indicated.   Preferred narcotic agents for pain control are hydromorphone , fentanyl , and methadone. Morphine should not be used.  Avoid Baclofen and avoid oral sodium phosphate  and magnesium citrate based laxatives / bowel preps.  Continue strict Input and Output monitoring. Will monitor the patient closely with you and intervene or adjust therapy as indicated by changes in clinical status/labs  ABLA- agree with blood transfusion and GI workup.  Pending EGD.  Stop diclofenac and would not resume NSAIDs given GI bleed and AKI.  Polyarticular psoriatic arthritis - on enbrel per [place] [facility] Gout - continue with allopurinol. HTN - hold ARB and follow. Elevated alk phos - per primary  DM type 2 - hold metformin  due to AKI and risk of life threatening lactic acidosis.  AGMA - likely due to AKI and improving after IV lactated ringers .  Continue to hold metformin  for now.    Fairy LABOR Nariah Morgano 07/19/2024, 8:08 AM   I personally reviewed external EMR notes, lab results, imaging studies, and cardiac studies.  I also discussed the case with Dr. Dana and agree with holding Losartan  and metformin  for now.

## 2024-07-19 NOTE — Plan of Care (Signed)
  Problem: Education: Goal: Knowledge of General Education information will improve Description: Including pain rating scale, medication(s)/side effects and non-pharmacologic comfort measures Outcome: Progressing   Problem: Clinical Measurements: Goal: Ability to maintain clinical measurements within normal limits will improve Outcome: Progressing Goal: Will remain free from infection Outcome: Progressing Goal: Diagnostic test results will improve Outcome: Progressing Goal: Respiratory complications will improve Outcome: Progressing Goal: Cardiovascular complication will be avoided Outcome: Progressing   Problem: Nutrition: Goal: Adequate nutrition will be maintained Outcome: Progressing   Problem: Coping: Goal: Level of anxiety will decrease Outcome: Progressing   Problem: Safety: Goal: Ability to remain free from injury will improve Outcome: Progressing   Problem: Skin Integrity: Goal: Risk for impaired skin integrity will decrease Outcome: Progressing

## 2024-07-19 NOTE — Anesthesia Postprocedure Evaluation (Signed)
 Anesthesia Post Note  Patient: Jonathan Grimes  Procedure(s) Performed: EGD (ESOPHAGOGASTRODUODENOSCOPY) DILATION, ESOPHAGUS  Patient location during evaluation: Phase II Anesthesia Type: General Level of consciousness: awake and alert Pain management: pain level controlled Vital Signs Assessment: post-procedure vital signs reviewed and stable Respiratory status: spontaneous breathing, nonlabored ventilation and respiratory function stable Cardiovascular status: stable Anesthetic complications: no   There were no known notable events for this encounter.   Last Vitals:  Vitals:   07/19/24 1546 07/19/24 1600  BP: 129/61 (!) 137/117  Pulse: 100 78  Resp: (!) 22 (!) 21  Temp: (!) 36.3 C   SpO2: 93% 99%    Last Pain:  Vitals:   07/19/24 1546  TempSrc:   PainSc: 0-No pain                 Shahzain Kiester L Jayven Naill

## 2024-07-19 NOTE — Consult Note (Signed)
 WOC Nurse Consult Note: Reason for Consult: pressure injury  Wound type: Stage 3 Pressure Injury; left buttock Unstageable Pressure Injury; right buttock  Pressure Injury POA: Yes Measurement: see nursing flow sheets Wound bed: Left buttock; 100% pink Right buttock: 100% yellow  Drainage (amount, consistency, odor) see nursing flow sheets Periwound: macerated and red Dressing procedure/placement/frequency: Cleanse right and left buttock wounds with saline, pat dry Apply Santyl to the right buttock, top with foam Apply single layer of xeroform to the left buttock, top with foam.   Change each daily, ok to lift foam to reapply Santyl or change xeroform   Turn and reposition per hospital policy Order low air loss mattress for moisture management and pressure redistribution when moves from the ICU   Re consult if needed, will not follow at this time. Thanks  Annalysse [employer], RN,CWOCN, CNS, [employer] [phone]

## 2024-07-20 ENCOUNTER — Telehealth: Payer: Self-pay | Admitting: Gastroenterology

## 2024-07-20 ENCOUNTER — Encounter (HOSPITAL_COMMUNITY): Payer: Self-pay | Admitting: Gastroenterology

## 2024-07-20 DIAGNOSIS — R0602 Shortness of breath: Secondary | ICD-10-CM

## 2024-07-20 DIAGNOSIS — K2990 Gastroduodenitis, unspecified, without bleeding: Secondary | ICD-10-CM

## 2024-07-20 DIAGNOSIS — K250 Acute gastric ulcer with hemorrhage: Secondary | ICD-10-CM

## 2024-07-20 DIAGNOSIS — K2970 Gastritis, unspecified, without bleeding: Secondary | ICD-10-CM

## 2024-07-20 DIAGNOSIS — N179 Acute kidney failure, unspecified: Secondary | ICD-10-CM

## 2024-07-20 DIAGNOSIS — D649 Anemia, unspecified: Secondary | ICD-10-CM

## 2024-07-20 DIAGNOSIS — K921 Melena: Secondary | ICD-10-CM

## 2024-07-20 DIAGNOSIS — D500 Iron deficiency anemia secondary to blood loss (chronic): Secondary | ICD-10-CM

## 2024-07-20 LAB — BPAM RBC
Blood Product Expiration Date: 202512222359
ISSUE DATE / TIME: 202511200718
Unit Type and Rh: 5100

## 2024-07-20 LAB — CBC
HCT: 24.1 % — ABNORMAL LOW (ref 39.0–52.0)
Hemoglobin: 7.8 g/dL — ABNORMAL LOW (ref 13.0–17.0)
MCH: 31.3 pg (ref 26.0–34.0)
MCHC: 32.4 g/dL (ref 30.0–36.0)
MCV: 96.8 fL (ref 80.0–100.0)
Platelets: 187 K/uL (ref 150–400)
RBC: 2.49 MIL/uL — ABNORMAL LOW (ref 4.22–5.81)
RDW: 14.3 % (ref 11.5–15.5)
WBC: 5.9 K/uL (ref 4.0–10.5)
nRBC: 0 % (ref 0.0–0.2)

## 2024-07-20 LAB — TYPE AND SCREEN
ABO/RH(D): O POS
Antibody Screen: NEGATIVE
Unit division: 0

## 2024-07-20 LAB — RENAL FUNCTION PANEL
Albumin: 2.9 g/dL — ABNORMAL LOW (ref 3.5–5.0)
Anion gap: 12 (ref 5–15)
BUN: 29 mg/dL — ABNORMAL HIGH (ref 8–23)
CO2: 22 mmol/L (ref 22–32)
Calcium: 8.4 mg/dL — ABNORMAL LOW (ref 8.9–10.3)
Chloride: 108 mmol/L (ref 98–111)
Creatinine, Ser: 1.68 mg/dL — ABNORMAL HIGH (ref 0.61–1.24)
GFR, Estimated: 44 mL/min — ABNORMAL LOW (ref >60)
Glucose, Bld: 120 mg/dL — ABNORMAL HIGH (ref 70–99)
Phosphorus: 3.9 mg/dL (ref 2.5–4.6)
Potassium: 4.7 mmol/L (ref 3.5–5.1)
Sodium: 142 mmol/L (ref 135–145)

## 2024-07-20 MED ORDER — OMEPRAZOLE 40 MG PO CPDR: 40.0000 mg | DELAYED_RELEASE_CAPSULE | Freq: Two times a day (BID) | ORAL | 1 refills | Status: AC

## 2024-07-20 MED ORDER — FERROUS FUMARATE-VITAMIN C ER 65-25 MG PO TBCR: 1.0000 | EXTENDED_RELEASE_TABLET | Freq: Two times a day (BID) | ORAL | 0 refills | Status: DC

## 2024-07-20 NOTE — Care Management Important Message (Signed)
 Important Message  Patient Details  Name: Jonathan Grimes MRN: [id number] Date of Birth: [birth year]   Important Message Given:  Yes - Medicare IM     Ashten Prats L Deyon Chizek 07/20/2024, 12:04 PM

## 2024-07-20 NOTE — Telephone Encounter (Signed)
 Patient of Therisa and Dr. Shaaron.  Discharged today from the hospital, I did not see prior to discharge.  Hemoglobin stable at 7.8 and he was tolerating diet therefore patient requested to return home and have close outpatient follow-up with nephrology as well as cardiology and GI.  He underwent EGD while inpatient with empiric esophageal dilation but with localized congested erythema and friable tissue in the stomach with multiple biopsies to rule out malignancy, congestion treated with APC therapy.  There is also evidence of erosive gastropathy without bleeding and normal duodenal bulb.  If biopsies are nondiagnostic, Dr. Cinderella recommended repeat upper endoscopy in 3-4 weeks to assess healing of abnormal mucosa and rule out malignancy.  Charmaine S/Dena: Although hemoglobin is stable in the last 24 hours, given his hgb remains within the 7 range I recommended close outpatient follow-up with H/H no later than 1 week after discharge.  Given the holidays, I would recommend he have labs performed on Tuesday or Wednesday next week. (Week of thanksgiving). Dx: anemia, UGI bleed  Ladonna He is in need of hospital follow up with AB or Rourk in the next 3-4 weeks if possible. LSL also saw him but very briefly.   Charmaine Melia, MSN, APRN, FNP-BC, AGACNP-BC [facility] at [facility]

## 2024-07-20 NOTE — Plan of Care (Signed)
  Problem: Education: Goal: Knowledge of General Education information will improve Description: Including pain rating scale, medication(s)/side effects and non-pharmacologic comfort measures 07/20/2024 1202 by Mathews Norleen POUR, RN Outcome: Adequate for Discharge 07/20/2024 0928 by Mathews Norleen POUR, RN Outcome: Progressing   Problem: Health Behavior/Discharge Planning: Goal: Ability to manage health-related needs will improve 07/20/2024 1202 by Mathews Norleen POUR, RN Outcome: Adequate for Discharge 07/20/2024 0928 by Mathews Norleen POUR, RN Outcome: Progressing   Problem: Clinical Measurements: Goal: Ability to maintain clinical measurements within normal limits will improve 07/20/2024 1202 by Mathews Norleen POUR, RN Outcome: Adequate for Discharge 07/20/2024 0928 by Mathews Norleen POUR, RN Outcome: Progressing Goal: Will remain free from infection 07/20/2024 1202 by Mathews Norleen POUR, RN Outcome: Adequate for Discharge 07/20/2024 0928 by Mathews Norleen POUR, RN Outcome: Progressing Goal: Diagnostic test results will improve 07/20/2024 1202 by Mathews Norleen POUR, RN Outcome: Adequate for Discharge 07/20/2024 9071 by Mathews Norleen POUR, RN Outcome: Progressing Goal: Respiratory complications will improve 07/20/2024 1202 by Mathews Norleen POUR, RN Outcome: Adequate for Discharge 07/20/2024 9071 by Mathews Norleen POUR, RN Outcome: Progressing Goal: Cardiovascular complication will be avoided 07/20/2024 1202 by Mathews Norleen POUR, RN Outcome: Adequate for Discharge 07/20/2024 0928 by Mathews Norleen POUR, RN Outcome: Progressing   Problem: Activity: Goal: Risk for activity intolerance will decrease 07/20/2024 1202 by Mathews Norleen POUR, RN Outcome: Adequate for Discharge 07/20/2024 [phone] by Mathews Norleen POUR, RN Outcome: Progressing   Problem: Nutrition: Goal: Adequate nutrition will be maintained 07/20/2024 1202 by Mathews Norleen POUR, RN Outcome: Adequate for Discharge 07/20/2024 0928 by Mathews Norleen POUR, RN Outcome: Progressing   Problem:  Coping: Goal: Level of anxiety will decrease 07/20/2024 1202 by Mathews Norleen POUR, RN Outcome: Adequate for Discharge 07/20/2024 0928 by Mathews Norleen POUR, RN Outcome: Progressing   Problem: Elimination: Goal: Will not experience complications related to bowel motility 07/20/2024 1202 by Mathews Norleen POUR, RN Outcome: Adequate for Discharge 07/20/2024 0928 by Mathews Norleen POUR, RN Outcome: Progressing Goal: Will not experience complications related to urinary retention 07/20/2024 1202 by Mathews Norleen POUR, RN Outcome: Adequate for Discharge 07/20/2024 0928 by Mathews Norleen POUR, RN Outcome: Progressing   Problem: Pain Managment: Goal: General experience of comfort will improve and/or be controlled 07/20/2024 1202 by Mathews Norleen POUR, RN Outcome: Adequate for Discharge 07/20/2024 9071 by Mathews Norleen POUR, RN Outcome: Progressing   Problem: Safety: Goal: Ability to remain free from injury will improve 07/20/2024 1202 by Mathews Norleen POUR, RN Outcome: Adequate for Discharge 07/20/2024 9071 by Mathews Norleen POUR, RN Outcome: Progressing   Problem: Skin Integrity: Goal: Risk for impaired skin integrity will decrease 07/20/2024 1202 by Mathews Norleen POUR, RN Outcome: Adequate for Discharge 07/20/2024 0928 by Mathews Norleen POUR, RN Outcome: Progressing

## 2024-07-20 NOTE — Discharge Summary (Signed)
 Physician Discharge Summary   Patient: Jonathan Grimes MRN: [id number] DOB: [birth year]  Admit date:     07/18/2024  Discharge date: 07/20/24  Discharge Physician: Amaryllis Dare   PCP: Marvine Rush, MD   Recommendations at discharge:  Please obtain CBC and BMP on follow-up Please avoid NSAID Follow-up with GI for biopsy results Follow-up with primary care provider  Discharge Diagnoses: Principal Problem:   Dyspnea Active Problems:   HEMATOCHEZIA   IDA (iron deficiency anemia)   DOE (dyspnea on exertion)   Gastritis and gastroduodenitis   Acute gastric ulcer with hemorrhage   AKI (acute kidney injury)   Anemia   Hospital Course: 68 year old man with subacute hematochezia attributed to hemorrhoids, psoriatic arthritis on Enbrel admitted for dyspnea. Review of systems is notable for early satiety and a 30 pound weight loss over the past 11 months. Laboratory workup notable for progressive decrease in hemoglobin, kidney dysfunction.  Echocardiogram was obtained for concern of exertional dyspnea and questionable orthopnea, did show normal EF and diastolic parameter, mild to moderate AS with mean gradient of 20.  CT scan of the lung with subpleural reticulation and parenchymal banding within the posterior lung base which are nonspecific but could represent a chronic interstitial lung disease.  Patient will get benefit from seeing a pulmonologist for further evaluation.  Also noted to have increased caliber of main pulmonary artery concerning for PA hypertension.  Nephrology was consulted for concern of AKI, likely ATN in the setting of ABLA and concomitant ARB therapy plus minus NSAID use.  Renal functions slowly improved and creatinine was 1.68 on the day of discharge.  Patient should be encouraged for p.o. hydration and should avoid NSAID and nephrotoxic agents.  Patient will need a close follow-up with PCP for monitoring of renal function.  Patient was also found to be anemic secondary  to ABLA and iron deficiency anemia, there was some ongoing hemorrhoidal bleeding s/p banding.  EGD was also done on 11/20 which shows erosive gastropathy, very frail and easily bleeding gastric mucosa, biopsies were taken.  GI is recommending twice daily Protonix  for 8 weeks.  He will follow-up with gastroenterology for biopsy results.  Patient did receive 1 unit of PRBC on 11/28 and started on iron supplement.  Patient was found to have elevated alkaline phosphatase, otherwise rest of the liver functions were normal.  Might get benefit from a skeletal survey by PCP after stomach biopsy results.  Patient will continue the rest of his home medications and need to have a close follow-up with his providers for further assistance.     Consultants: Nephrology.  Gastroenterology Procedures performed: EGD, banding of hemorrhoids Disposition: Home Diet recommendation:  Discharge Diet Orders (From admission, onward)     Start     Ordered   07/20/24 0000  Diet - low sodium heart healthy        07/20/24 1140           Cardiac and Carb modified diet DISCHARGE MEDICATION: Allergies as of 07/20/2024   No Known Allergies      Medication List     TAKE these medications    allopurinol  100 MG tablet Commonly known as: ZYLOPRIM  Take 100 mg by mouth daily. What changed: how much to take   atorvastatin  10 MG tablet Commonly known as: LIPITOR 1 tablet Orally Once a day for 30 day(s)   escitalopram  10 MG tablet Commonly known as: LEXAPRO  Take 10 mg by mouth daily.   Ferrous Fumarate -Vitamin C  ER 65-25 MG Tbcr  Take 1 tablet by mouth 2 (two) times daily.   fluticasone  50 MCG/ACT nasal spray Commonly known as: FLONASE  Place 2 sprays into both nostrils daily.   levothyroxine  50 MCG tablet Commonly known as: SYNTHROID  Take 50 mcg by mouth daily.   loratadine  10 MG tablet Commonly known as: CLARITIN  Take 1 tablet (10 mg total) by mouth daily.   omeprazole  40 MG capsule Commonly  known as: PRILOSEC Take 1 capsule (40 mg total) by mouth 2 (two) times daily. What changed: when to take this               Discharge Care Instructions  (From admission, onward)           Start     Ordered   07/20/24 0000  Discharge wound care:       Comments: Cleanse right and left buttock wounds with saline, pat dry Apply Santyl  to the right buttock, top with foam Apply single layer of xeroform to the left buttock, top with foam.    Change each daily, ok to lift foam to reapply Santyl  or change xeroform   07/20/24 1140            Follow-up Information     Marvine Rush, MD. Schedule an appointment as soon as possible for a visit in 1 week(s).   Specialty: Family Medicine Contact information: [address] [place] [address] [phone]         Cinderella Deatrice FALCON, MD Follow up.   Specialty: Gastroenterology Contact information: [address] [place] [address] [phone]                Discharge Exam: Fredricka Weights   07/18/24 1721  Weight: 109.2 kg   General.  Obese gentleman, in no acute distress. Pulmonary.  Lungs clear bilaterally, normal respiratory effort. CV.  Regular rate and rhythm, no JVD, rub or murmur. Abdomen.  Soft, nontender, nondistended, BS positive. CNS.  Alert and oriented .  No focal neurologic deficit. Extremities.  No edema, no cyanosis, pulses intact and symmetrical. Psychiatry.  Judgment and insight appears normal.   Condition at discharge: stable  The results of significant diagnostics from this hospitalization (including imaging, microbiology, ancillary and laboratory) are listed below for reference.   Imaging Studies: US  RENAL Result Date: 07/19/2024 EXAM: US  Retroperitoneum Complete, Renal. 07/19/2024 10:19:24 AM TECHNIQUE: Real-time ultrasonography of the retroperitoneum renal was performed. COMPARISON: 06/12/24 CLINICAL HISTORY: Acute kidney injury. FINDINGS: FINDINGS: RIGHT  KIDNEY/URETER: Right kidney measures 12.8 x 5.7 x 6.5 cm. The volume is equal to 246.6 cc. Normal cortical echogenicity. No hydronephrosis. No calculus. No mass. LEFT KIDNEY/URETER: Left kidney measures 12.9 x 6.2 x 6.2 cm. The volume is equal to 260.7 cc. Increased cortical echogenicity of the left kidney. Small cyst within the upper pole of the left kidney measures 1.2 cm. No hydronephrosis. No calculus. No mass. BLADDER: Unremarkable appearance of the bladder. IMPRESSION: 1. Increased cortical echogenicity of the left kidney, compatible with acute kidney injury. 2. No signs of obstructive uropathy. Electronically signed by: Waddell Calk MD 07/19/2024 12:51 PM EST RP Workstation: [id number]   CT CHEST WO CONTRAST Result Date: 07/18/2024 EXAM: CT CHEST WITHOUT CONTRAST 07/18/2024 04:07:43 PM TECHNIQUE: CT of the chest was performed without the administration of intravenous contrast. Multiplanar reformatted images are provided for review. Automated exposure control, iterative reconstruction, and/or weight based adjustment of the mA/kV was utilized to reduce the radiation dose to as low as reasonably achievable. COMPARISON: None available. CLINICAL  HISTORY: Dyspnea, chronic, unclear etiology. FINDINGS: MEDIASTINUM: Heart size is at the upper limits of normal. Aortic atherosclerosis. The main pulmonary artery measures 3.3 cm (image 68/2). Three-vessel coronary artery calcifications. Aortic and mitral valve calcifications. No pericardial effusion. The central airways are patent. LYMPH NODES: No mediastinal, hilar or axillary lymphadenopathy. LUNGS AND PLEURA: Bilateral subpleural interstitial reticulation and subpleural banding identified within the posterior lung bases. There is partial atelectasis and volume loss involving the basilar portions of the right middle lobe. No pleural effusion or pneumothorax. No interstitial edema or airspace consolidation. SOFT TISSUES/BONES: Mild degenerative changes within the  thoracic spine. Remote left lateral 8th rib fracture. No acute abnormality of the soft tissues. UPPER ABDOMEN: Limited images of the upper abdomen demonstrates no acute abnormality. IMPRESSION: 1. No acute cardiopulmonary abnormality identified. 2. Subpleural reticulation and parenchymal banding within the posterior lung bases are nonspecific but may be seen in the setting of chronic interstitial lung disease. 3. Partial atelectasis and volume loss involving the basilar right middle lobe. 4. 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 5. Aortic atherosclerosis and 3-vessel coronary artery calcifications. Electronically signed by: Waddell Calk MD 07/18/2024 04:36 PM EST RP Workstation: [id number]   DG Chest 2 View Result Date: 07/18/2024 EXAM: 2 VIEW(S) XRAY OF THE CHEST 07/18/2024 11:07:00 AM COMPARISON: None available. CLINICAL HISTORY: SOB FINDINGS: LUNGS AND PLEURA: Asymmetric elevation of right hemidiaphragm. No focal pulmonary opacity. No pleural effusion. No pneumothorax. HEART AND MEDIASTINUM: No acute abnormality of the cardiac and mediastinal silhouettes. BONES AND SOFT TISSUES: Thoracic degenerative changes. No acute osseous abnormality. IMPRESSION: 1. Asymmetric elevation of the right hemidiaphragm. 2. No acute findings. Electronically signed by: Norleen Boxer MD 07/18/2024 11:32 AM EST RP Workstation: [id number]   ECHOCARDIOGRAM COMPLETE Result Date: 07/05/2024    ECHOCARDIOGRAM REPORT   Patient Name:   Jonathan Grimes Date of Exam: 07/05/2024 Medical Rec #:  [id number]       Height:       73.0 in Accession #:    [id number]      Weight:       257.0 lb Date of Birth:  23-Dec-1955       BSA:          2.394 m Patient Age:    68 years        BP:           104/63 mmHg Patient Gender: M               HR:           95 bpm. Exam Location:  [facility] Procedure: 2D Echo, Cardiac Doppler and Color Doppler (Both Spectral and Color            Flow Doppler were utilized during  procedure). Indications:    Murmur R01.1  History:        Patient has no prior history of Echocardiogram examinations.                 Risk Factors:Hypertension, Diabetes, Dyslipidemia and Sleep                 Apnea.  Sonographer:    Aida Pizza RCS Referring Phys: [phone] ANNA W BOONE IMPRESSIONS  1. Left ventricular ejection fraction, by estimation, is 60 to 65%. The left ventricle has normal function. The left ventricle has no regional wall motion abnormalities. Left ventricular diastolic parameters are consistent with Grade I diastolic dysfunction (impaired relaxation).  2. Right ventricular systolic function  is normal. The right ventricular size is normal. Tricuspid regurgitation signal is inadequate for assessing PA pressure.  3. The mitral valve is normal in structure. Trivial mitral valve regurgitation. No evidence of mitral stenosis.  4. The tricuspid valve is abnormal.  5. The aortic valve is tricuspid. There is mild calcification of the aortic valve. There is mild thickening of the aortic valve. Aortic valve regurgitation is not visualized. Mild to moderate aortic valve stenosis. Aortic valve area, by VTI measures 1.66 cm. Aortic valve mean gradient measures 20.7 mmHg.  6. The inferior vena cava is normal in size with greater than 50% respiratory variability, suggesting right atrial pressure of 3 mmHg. FINDINGS  Left Ventricle: Left ventricular ejection fraction, by estimation, is 60 to 65%. The left ventricle has normal function. The left ventricle has no regional wall motion abnormalities. The left ventricular internal cavity size was normal in size. There is  no left ventricular hypertrophy. Left ventricular diastolic parameters are consistent with Grade I diastolic dysfunction (impaired relaxation). Normal left ventricular filling pressure. Right Ventricle: The right ventricular size is normal. Right vetricular wall thickness was not well visualized. Right ventricular systolic function is normal.  Tricuspid regurgitation signal is inadequate for assessing PA pressure. Left Atrium: Left atrial size was normal in size. Right Atrium: Right atrial size was normal in size. Pericardium: There is no evidence of pericardial effusion. Mitral Valve: The mitral valve is normal in structure. There is mild thickening of the mitral valve leaflet(s). There is mild calcification of the mitral valve leaflet(s). Mild mitral annular calcification. Trivial mitral valve regurgitation. No evidence  of mitral valve stenosis. Tricuspid Valve: The tricuspid valve is abnormal. Tricuspid valve regurgitation is mild . No evidence of tricuspid stenosis. Aortic Valve: The aortic valve is tricuspid. There is mild calcification of the aortic valve. There is mild thickening of the aortic valve. There is mild aortic valve annular calcification. Aortic valve regurgitation is not visualized. Mild to moderate aortic stenosis is present. Aortic valve mean gradient measures 20.7 mmHg. Aortic valve peak gradient measures 35.0 mmHg. Aortic valve area, by VTI measures 1.66 cm. Pulmonic Valve: The pulmonic valve was not well visualized. Pulmonic valve regurgitation is not visualized. No evidence of pulmonic stenosis. Aorta: The aortic root is normal in size and structure. Venous: The inferior vena cava is normal in size with greater than 50% respiratory variability, suggesting right atrial pressure of 3 mmHg. IAS/Shunts: No atrial level shunt detected by color flow Doppler.  LEFT VENTRICLE PLAX 2D LVIDd:         4.70 cm   Diastology LVIDs:         2.90 cm   LV e' medial:    5.10 cm/s LV PW:         0.90 cm   LV E/e' medial:  20.0 LV IVS:        1.10 cm   LV e' lateral:   8.45 cm/s LVOT diam:     2.10 cm   LV E/e' lateral: 12.1 LV SV:         99 LV SV Index:   41 LVOT Area:     3.46 cm  RIGHT VENTRICLE RV S prime:     13.30 cm/s TAPSE (M-mode): 2.3 cm LEFT ATRIUM             Index        RIGHT ATRIUM           Index LA diam:  4.00 cm 1.67 cm/m    RA Area:     11.80 cm LA Vol (A2C):   54.3 ml 22.69 ml/m  RA Volume:   26.90 ml  11.24 ml/m LA Vol (A4C):   96.3 ml 40.23 ml/m LA Biplane Vol: 72.1 ml 30.12 ml/m  AORTIC VALVE AV Area (Vmax):    1.49 cm AV Area (Vmean):   1.52 cm AV Area (VTI):     1.66 cm AV Vmax:           295.75 cm/s AV Vmean:          210.667 cm/s AV VTI:            0.595 m AV Peak Grad:      35.0 mmHg AV Mean Grad:      20.7 mmHg LVOT Vmax:         127.00 cm/s LVOT Vmean:        92.200 cm/s LVOT VTI:          0.285 m LVOT/AV VTI ratio: 0.48  AORTA Ao Root diam: 3.90 cm MITRAL VALVE MV Area (PHT): 4.80 cm     SHUNTS MV Decel Time: 158 msec     Systemic VTI:  0.29 m MV E velocity: 102.00 cm/s  Systemic Diam: 2.10 cm MV A velocity: 140.00 cm/s MV E/A ratio:  0.73 Dorn Ross MD Electronically signed by Dorn Ross MD Signature Date/Time: 07/05/2024/1:19:21 PM    Final     Microbiology: Results for orders placed or performed during the hospital encounter of 07/11/15  Body fluid culture     Status: None   Collection Time: 07/11/15  1:24 PM   Specimen: Synovium; Body Fluid  Result Value Ref Range Status   Specimen Description SYNOVIAL RIGHT KNEE  Final   Special Requests FLUID ON SWAB  Final   Gram Stain   Final    FEW WBC PRESENT,BOTH PMN AND MONONUCLEAR NO ORGANISMS SEEN Gram Stain Report Called to,Read Back By and Verified With: COTTA,P AT 1430 ON [id number] BY HOOKER,B    Culture   Final    NO GROWTH 3 DAYS Performed at [facility]    Report Status 07/14/2015 FINAL  Final  Anaerobic culture     Status: None   Collection Time: 07/11/15  1:25 PM   Specimen: Synovium; Body Fluid  Result Value Ref Range Status   Specimen Description SYNOVIAL RIGHT KNEE  Final   Special Requests NONE  Final   Gram Stain   Final    RARE WBC PRESENT, PREDOMINANTLY MONONUCLEAR NO ORGANISMS SEEN Performed at [facility]    Culture   Final    NO ANAEROBES ISOLATED Performed at [facility]    Report  Status 07/16/2015 FINAL  Final    Labs: CBC: Recent Labs  Lab 07/18/24 1105 07/19/24 0415 07/19/24 1319 07/20/24 0343  WBC 8.3 5.9 6.1 5.9  NEUTROABS 6.1  --   --   --   HGB 8.0* 6.9* 8.0* 7.8*  HCT 25.8* 21.8* 24.5* 24.1*  MCV 98.5 97.8 96.8 96.8  PLT 204 182 184 187   Basic Metabolic Panel: Recent Labs  Lab 07/18/24 1105 07/19/24 0415 07/20/24 0343  NA 137 140 142  K 5.0 4.6 4.7  CL 103 108 108  CO2 17* 20* 22  GLUCOSE 144* 116* 120*  BUN 48* 39* 29*  CREATININE 2.76* 2.14* 1.68*  CALCIUM  8.8* 8.3* 8.4*  PHOS  --   --  3.9   Liver  Function Tests: Recent Labs  Lab 07/18/24 1105 07/20/24 0343  AST 33  --   ALT 21  --   ALKPHOS 251*  --   BILITOT 0.3  --   PROT 7.3  --   ALBUMIN 3.4* 2.9*   CBG: Recent Labs  Lab 07/19/24 1406  GLUCAP 104*    Discharge time spent: greater than 30 minutes.  This record has been created using Conservation officer, historic buildings. Errors have been sought and corrected,but may not always be located. Such creation errors do not reflect on the standard of care.   Signed: Amaryllis Dare, MD [facility] 07/20/2024

## 2024-07-20 NOTE — Progress Notes (Signed)
 [facility] Progress Note   Subjective:   Seen in room, feels fine.  Denies LUTs.  No UOP documented but he says going normal volume and quality.  Wife bedside.    Objective Vitals:   07/19/24 1546 07/19/24 1600 07/19/24 1951 07/20/24 0317  BP: 129/61 (!) 137/117 122/81 129/84  Pulse: 100 78 85 79  Resp: (!) 22 (!) 21 20 20   Temp: (!) 97.4 F (36.3 C)  98.1 F (36.7 C) 97.8 F (36.6 C)  TempSrc:   Oral   SpO2: 93% 99% 94% 91%  Weight:      Height:       Physical Exam General: obese man comfortable in chair Heart:RRR Lungs: clear on RA Abdomen: obese, soft Extremities: no edema Neuro: grossly nonfocal  Additional Objective Labs: Basic Metabolic Panel: Recent Labs  Lab 07/18/24 1105 07/19/24 0415 07/20/24 0343  NA 137 140 142  K 5.0 4.6 4.7  CL 103 108 108  CO2 17* 20* 22  GLUCOSE 144* 116* 120*  BUN 48* 39* 29*  CREATININE 2.76* 2.14* 1.68*  CALCIUM  8.8* 8.3* 8.4*  PHOS  --   --  3.9   Liver Function Tests: Recent Labs  Lab 07/18/24 1105 07/20/24 0343  AST 33  --   ALT 21  --   ALKPHOS 251*  --   BILITOT 0.3  --   PROT 7.3  --   ALBUMIN 3.4* 2.9*   No results for input(s): LIPASE, AMYLASE in the last 168 hours. CBC: Recent Labs  Lab 07/18/24 1105 07/19/24 0415 07/19/24 1319 07/20/24 0343  WBC 8.3 5.9 6.1 5.9  NEUTROABS 6.1  --   --   --   HGB 8.0* 6.9* 8.0* 7.8*  HCT 25.8* 21.8* 24.5* 24.1*  MCV 98.5 97.8 96.8 96.8  PLT 204 182 184 187   Blood Culture    Component Value Date/Time   SDES SYNOVIAL RIGHT KNEE 07/11/2015 1325   SPECREQUEST NONE 07/11/2015 1325   CULT  07/11/2015 1325    NO ANAEROBES ISOLATED Performed at [facility]    REPTSTATUS 07/16/2015 FINAL 07/11/2015 1325    Cardiac Enzymes: No results for input(s): CKTOTAL, CKMB, CKMBINDEX, TROPONINI in the last 168 hours. CBG: Recent Labs  Lab 07/19/24 1406  GLUCAP 104*   Iron Studies: No results for input(s): IRON, TIBC,  TRANSFERRIN, FERRITIN in the last 72 hours. @lablastinr3 @ Studies/Results: US  RENAL Result Date: 07/19/2024 EXAM: US  Retroperitoneum Complete, Renal. 07/19/2024 10:19:24 AM TECHNIQUE: Real-time ultrasonography of the retroperitoneum renal was performed. COMPARISON: 06/12/24 CLINICAL HISTORY: Acute kidney injury. FINDINGS: FINDINGS: RIGHT KIDNEY/URETER: Right kidney measures 12.8 x 5.7 x 6.5 cm. The volume is equal to 246.6 cc. Normal cortical echogenicity. No hydronephrosis. No calculus. No mass. LEFT KIDNEY/URETER: Left kidney measures 12.9 x 6.2 x 6.2 cm. The volume is equal to 260.7 cc. Increased cortical echogenicity of the left kidney. Small cyst within the upper pole of the left kidney measures 1.2 cm. No hydronephrosis. No calculus. No mass. BLADDER: Unremarkable appearance of the bladder. IMPRESSION: 1. Increased cortical echogenicity of the left kidney, compatible with acute kidney injury. 2. No signs of obstructive uropathy. Electronically signed by: Waddell Calk MD 07/19/2024 12:51 PM EST RP Workstation: [id number]   CT CHEST WO CONTRAST Result Date: 07/18/2024 EXAM: CT CHEST WITHOUT CONTRAST 07/18/2024 04:07:43 PM TECHNIQUE: CT of the chest was performed without the administration of intravenous contrast. Multiplanar reformatted images are provided for review. Automated exposure control, iterative reconstruction, and/or weight based adjustment of the  mA/kV was utilized to reduce the radiation dose to as low as reasonably achievable. COMPARISON: None available. CLINICAL HISTORY: Dyspnea, chronic, unclear etiology. FINDINGS: MEDIASTINUM: Heart size is at the upper limits of normal. Aortic atherosclerosis. The main pulmonary artery measures 3.3 cm (image 68/2). Three-vessel coronary artery calcifications. Aortic and mitral valve calcifications. No pericardial effusion. The central airways are patent. LYMPH NODES: No mediastinal, hilar or axillary lymphadenopathy. LUNGS AND PLEURA: Bilateral  subpleural interstitial reticulation and subpleural banding identified within the posterior lung bases. There is partial atelectasis and volume loss involving the basilar portions of the right middle lobe. No pleural effusion or pneumothorax. No interstitial edema or airspace consolidation. SOFT TISSUES/BONES: Mild degenerative changes within the thoracic spine. Remote left lateral 8th rib fracture. No acute abnormality of the soft tissues. UPPER ABDOMEN: Limited images of the upper abdomen demonstrates no acute abnormality. IMPRESSION: 1. No acute cardiopulmonary abnormality identified. 2. Subpleural reticulation and parenchymal banding within the posterior lung bases are nonspecific but may be seen in the setting of chronic interstitial lung disease. 3. Partial atelectasis and volume loss involving the basilar right middle lobe. 4. 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 5. Aortic atherosclerosis and 3-vessel coronary artery calcifications. Electronically signed by: Waddell Calk MD 07/18/2024 04:36 PM EST RP Workstation: [id number]   DG Chest 2 View Result Date: 07/18/2024 EXAM: 2 VIEW(S) XRAY OF THE CHEST 07/18/2024 11:07:00 AM COMPARISON: None available. CLINICAL HISTORY: SOB FINDINGS: LUNGS AND PLEURA: Asymmetric elevation of right hemidiaphragm. No focal pulmonary opacity. No pleural effusion. No pneumothorax. HEART AND MEDIASTINUM: No acute abnormality of the cardiac and mediastinal silhouettes. BONES AND SOFT TISSUES: Thoracic degenerative changes. No acute osseous abnormality. IMPRESSION: 1. Asymmetric elevation of the right hemidiaphragm. 2. No acute findings. Electronically signed by: Norleen Boxer MD 07/18/2024 11:32 AM EST RP Workstation: [id number]   Medications:  lactated ringers  75 mL/hr at 07/19/24 1159    allopurinol   100 mg Oral Daily   atorvastatin   10 mg Oral Daily   collagenase    Topical Daily   escitalopram   10 mg Oral Daily   fluticasone   2  spray Each Nare Daily   levothyroxine   50 mcg Oral Daily   loratadine   10 mg Oral Daily   pantoprazole   40 mg Oral Daily    Assessment/Plan:   AKI - Normal baseline as of 12/2023 now w AKI  likely related to ischemic ATN in setting of ABLA and concomitant ARB therapy +/- NSAID use.  Also on DDx would be interstitial nephritis due to PPI.  Continue to hold ARB for now.  BUN/Cr continues improve after IVF's from 2.74 to 2.14 to 1.68 today.  Expect will continue to improve.  Given lack of active urine sediment, will hold off on GN workup.  Renal US  no obstruction - findings noted c/w AKI.  UA still pending.  No uremic symptoms or indications for dialysis at this time. Expect continued improvement. Avoid nephrotoxic medications including NSAIDs and iodinated intravenous contrast exposure unless the latter is absolutely indicated.   Preferred narcotic agents for pain control are hydromorphone , fentanyl , and methadone. Morphine should not be used.  Avoid Baclofen and avoid oral sodium phosphate  and magnesium citrate based laxatives / bowel preps.  Continue strict Input and Output monitoring. Will monitor the patient closely with you and intervene or adjust therapy as indicated by changes in clinical status/labs  ABLA- agree with blood transfusion and GI workup.  EGD 11/20 with friable gastric mucosa tx with APC and  biopsied.  Stop diclofenac and would not resume NSAIDs given GI bleed and AKI. 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 HTN - hold ARB and follow.  Can resume outpt or if AKI resolves while admitted Elevated alk phos - per primary  DM type 2 - hold metformin  due to AKI.  Not contraindicated for future use if AKI resolves.  AGMA - likely due to AKI and improved after IV lactated ringers .  Continue to hold metformin  for now.   Nothing further to add, expect kidney function will normalize.   Can just f/u with PCP Dr. Richelle and if any ongoing questions we'd be happy to see him outpt.    Manuelita Barters MD 07/20/2024, 10:39 AM  [facility] Pager: [phone]

## 2024-07-20 NOTE — Plan of Care (Signed)
   Problem: Education: Goal: Knowledge of General Education information will improve Description Including pain rating scale, medication(s)/side effects and non-pharmacologic comfort measures Outcome: Progressing   Problem: Clinical Measurements: Goal: Ability to maintain clinical measurements within normal limits will improve Outcome: Progressing   Problem: Activity: Goal: Risk for activity intolerance will decrease Outcome: Progressing

## 2024-07-20 NOTE — Plan of Care (Signed)

## 2024-07-23 ENCOUNTER — Encounter: Payer: Self-pay | Admitting: *Deleted

## 2024-07-23 ENCOUNTER — Other Ambulatory Visit: Payer: Self-pay | Admitting: *Deleted

## 2024-07-23 DIAGNOSIS — K922 Gastrointestinal hemorrhage, unspecified: Secondary | ICD-10-CM

## 2024-07-23 DIAGNOSIS — D500 Iron deficiency anemia secondary to blood loss (chronic): Secondary | ICD-10-CM

## 2024-07-23 LAB — SURGICAL PATHOLOGY

## 2024-07-24 ENCOUNTER — Telehealth: Payer: Self-pay | Admitting: Gastroenterology

## 2024-07-24 ENCOUNTER — Other Ambulatory Visit: Payer: Self-pay | Admitting: *Deleted

## 2024-07-24 DIAGNOSIS — K922 Gastrointestinal hemorrhage, unspecified: Secondary | ICD-10-CM

## 2024-07-24 DIAGNOSIS — D500 Iron deficiency anemia secondary to blood loss (chronic): Secondary | ICD-10-CM

## 2024-07-24 NOTE — Telephone Encounter (Signed)
 Received message today from Dr. Cinderella while I was out of the office on PAL.   Hi interestingly it was iron pill gastritis , please ask patient to stop iron , if iron is needed refer for IV Iron , optimize ppi to BID and repeat EGD directly in 4-6 weeks   Please let patient know the above.  Stop oral iron, due to iron pill gastritis on path Go for labs as directed already by [facility]. He really needs the 05/2024 labs ordered by Therisa too if we can make that happen. This will help decide if IV iron needed. Continue omeprazole  40mg  BID. Keep appt with Therisa for 12/10.  Schedule EGD in 4-6 weeks with Rourk (primary GI), ASA 4

## 2024-07-24 NOTE — Telephone Encounter (Signed)
 noted

## 2024-07-24 NOTE — Telephone Encounter (Signed)
 Spoke to pt's wife (DPR). Informed her of results and recommendations. She voiced understanding. She states that pt is suppose to see his PCP on Monday and wants to know if it is ok if he waits to have blood work done then.

## 2024-07-24 NOTE — Telephone Encounter (Signed)
 Spoke to pt's wife (DPR) informed her to take him today or either in the morning to have blood work done. She voiced understanding. Pt is still weak and has no energy. She voiced understanding. She state she will take him in the morning.

## 2024-07-25 ENCOUNTER — Ambulatory Visit: Payer: Self-pay | Admitting: Gastroenterology

## 2024-07-25 LAB — HEMOGLOBIN AND HEMATOCRIT, BLOOD
Hematocrit: 29.5 % — ABNORMAL LOW (ref 37.5–51.0)
Hemoglobin: 9.3 g/dL — ABNORMAL LOW (ref 13.0–17.7)

## 2024-07-25 NOTE — Telephone Encounter (Signed)
 Will schedule pt when he comes for OV on 08/08/24.

## 2024-07-25 NOTE — Telephone Encounter (Signed)
 Pt was made aware and verbalized understanding.

## 2024-07-25 NOTE — Telephone Encounter (Signed)
 Noted

## 2024-07-30 ENCOUNTER — Ambulatory Visit (HOSPITAL_BASED_OUTPATIENT_CLINIC_OR_DEPARTMENT_OTHER): Admitting: Student

## 2024-07-30 ENCOUNTER — Ambulatory Visit (INDEPENDENT_AMBULATORY_CARE_PROVIDER_SITE_OTHER): Payer: Self-pay | Admitting: Gastroenterology

## 2024-07-30 DIAGNOSIS — M25561 Pain in right knee: Secondary | ICD-10-CM | POA: Diagnosis not present

## 2024-07-30 DIAGNOSIS — Z96651 Presence of right artificial knee joint: Secondary | ICD-10-CM | POA: Diagnosis not present

## 2024-07-30 MED ORDER — TRAMADOL HCL 50 MG PO TABS: 50.0000 mg | ORAL_TABLET | Freq: Four times a day (QID) | ORAL | 0 refills | Status: AC | PRN

## 2024-07-30 NOTE — Progress Notes (Unsigned)
 Chief Complaint: Right knee pain     History of Present Illness:    Jonathan Grimes is a 68 y.o. male who presents to clinic today for evaluation of his right knee.  He does have a history of a right knee TKA revision back in 2016 with Dr. Vernetta.  He was seen in Dr. Damian office on 9/8 after sustaining a fall.  X-rays were well-appearing at that time and he had fluid aspirated from the knee which may have suggested gout based on appearance.  Patient has history of gout and takes allopurinol  daily.  6-week follow-up on 10/20 with Dr. Vernetta showed significant improvement in within the knee.  Today patient reports that over the past few weeks, his knee has been consistently painful, now to the point that he has difficulty putting weight on his right leg.  Denies any repeat injury.  No fever or chills.  He is taking Tylenol  without much relief.   Surgical History:   Right TKA revision 07/2015  PMH/PSH/Family History/Social History/Meds/Allergies:    Past Medical History:  Diagnosis Date   Arthritis    SEVERE OA LEFT HIP WITH PAIN   Diabetes mellitus without complication (HCC)    type 2   Elevated cholesterol    Fatty liver    GERD (gastroesophageal reflux disease)    H/O hiatal hernia    Hiatal hernia    Hypertension    Schatzki's ring    Sleep apnea    Tubular adenoma    Past Surgical History:  Procedure Laterality Date   BIOPSY N/A 10/02/2015   Procedure: BIOPSY;  Surgeon: Lamar CHRISTELLA Hollingshead, MD;  [facility];  Service: Endoscopy;  Laterality: N/A;  Gastric bx   COLONOSCOPY  01/19/2010   Dr.Rourk- friable anorectal. o/w normal rectum, L sided diverticula, sigmoid polyp bx= tubular adenoma,   COLONOSCOPY N/A 10/02/2015   MFM:wnmfjo appearing rectal/scattered sigmoid diverticula, next TCS five years for h/o colon adenomas   COLONOSCOPY N/A 01/04/2024   Procedure: COLONOSCOPY;  Surgeon: Hollingshead Lamar CHRISTELLA, MD;  [facility];  Service: Endoscopy;  Laterality: N/A;  730AM, ASA 3   COLONOSCOPY WITH PROPOFOL  N/A 01/04/2022   Procedure: COLONOSCOPY WITH PROPOFOL ;  Surgeon: Hollingshead Lamar CHRISTELLA, MD;  Location: [facility];  Service: Endoscopy;  Laterality: N/A;  7:30 / ASA 2   ESOPHAGEAL DILATION N/A 07/19/2024   Procedure: DILATION, ESOPHAGUS;  Surgeon: Cinderella Deatrice FALCON, MD;  [facility]: [facility];  Service: Endoscopy;  Laterality: N/A;   ESOPHAGOGASTRODUODENOSCOPY  01/19/2010   Dr.Rourk- noncritical schatzki's ring, o/w normal esophagus, small hiatal hernia o/w normal stomach D1, D2   ESOPHAGOGASTRODUODENOSCOPY N/A 10/02/2015   MFM:jwumjo erosiona s/p bx (no h.pylri, gastric mucosa with intestinal metaplasia)   ESOPHAGOGASTRODUODENOSCOPY N/A 07/19/2024   Procedure: EGD (ESOPHAGOGASTRODUODENOSCOPY);  Surgeon: Cinderella Deatrice FALCON, MD;  [facility];  Service: Endoscopy;  Laterality: N/A;   GIVENS CAPSULE STUDY N/A 11/21/2015   Procedure: GIVENS CAPSULE STUDY;  Surgeon: Lamar CHRISTELLA Hollingshead, MD;  [facility];  Service: Endoscopy;  Laterality: N/A;  0700   JOINT REPLACEMENT  08/30/2010   RIGHT TOTAL KNEE ARTHROPLASTY   TOTAL HIP ARTHROPLASTY  07/28/2012   Procedure: TOTAL HIP ARTHROPLASTY ANTERIOR APPROACH;  Surgeon: Lonni CINDERELLA Vernetta, MD;  Location: [facility];  Service: Orthopedics;  Laterality: Left;   TOTAL HIP ARTHROPLASTY Right 02/22/2014   Procedure: RIGHT TOTAL HIP ARTHROPLASTY ANTERIOR APPROACH;  Surgeon: Lonni CINDERELLA Poli, MD;  Location: [facility];  Service: Orthopedics;  Laterality: Right;   TOTAL KNEE REVISION Right 07/11/2015   Procedure: REVISION RIGHT TOTAL KNEE ARTHROPLASTYone component only;  Surgeon: Lonni CINDERELLA Poli, MD;  Location: [facility];  Service: Orthopedics;  Laterality: Right;   Social History   Socioeconomic History   Marital status: Married    Spouse name: Not on file   Number of children: Not on file   Years of education: Not on file   Highest education  level: Not on file  Occupational History   Not on file  Tobacco Use   Smoking status: Former    Current packs/day: 0.00    Types: Cigarettes    Quit date: 01/06/1982    Years since quitting: 42.5   Smokeless tobacco: Current    Types: Chew   Tobacco comments:    QUIT SMOKING 34 YRS AGO  QUIT CHEWING TOBACCO JAN 2012  Vaping Use   Vaping status: Never Used  Substance and Sexual Activity   Alcohol use: Yes    Comment: beer occasionally   Drug use: No   Sexual activity: Not on file  Other Topics Concern   Not on file  Social History Narrative   Not on file   Social Drivers of Health   Financial Resource Strain: Not on file  Food Insecurity: No Food Insecurity (07/18/2024)   Hunger Vital Sign    Worried About Running Out of Food in the Last Year: Never true    Ran Out of Food in the Last Year: Never true  Transportation Needs: No Transportation Needs (07/18/2024)   PRAPARE - Administrator, Civil Service (Medical): No    Lack of Transportation (Non-Medical): No  Physical Activity: Not on file  Stress: Not on file  Social Connections: Moderately Integrated (07/18/2024)   Social Connection and Isolation Panel    Frequency of Communication with Friends and Family: Twice a week    Frequency of Social Gatherings with Friends and Family: Twice a week    Attends Religious Services: 1 to 4 times per year    Active Member of [employer] or Organizations: No    Attends Banker Meetings: Never    Marital Status: Married   No family history on file. No Known Allergies Current Outpatient Medications  Medication Sig Dispense Refill   traMADol  (ULTRAM ) 50 MG tablet Take 1 tablet (50 mg total) by mouth every 6 (six) hours as needed for up to 5 days. 20 tablet 0   allopurinol  (ZYLOPRIM ) 100 MG tablet Take 100 mg by mouth daily. (Patient taking differently: Take 200 mg by mouth daily.)     atorvastatin  (LIPITOR) 10 MG tablet 1 tablet Orally Once a day for 30 day(s)      escitalopram  (LEXAPRO ) 10 MG tablet Take 10 mg by mouth daily.     Ferrous Fumarate -Vitamin C  ER 65-25 MG TBCR Take 1 tablet by mouth 2 (two) times daily. 180 tablet 0   fluticasone  (FLONASE ) 50 MCG/ACT nasal spray Place 2 sprays into both nostrils daily. 16 g 6   levothyroxine  (SYNTHROID ) 50 MCG tablet Take 50 mcg by mouth daily.     loratadine  (CLARITIN ) 10 MG tablet Take 1 tablet (10 mg total) by mouth daily. 90 tablet 11   omeprazole  (PRILOSEC) 40 MG capsule Take 1 capsule (40 mg total) by mouth 2 (  two) times daily. 60 capsule 1   No current facility-administered medications for this visit.   No results found.  Review of Systems:   A ROS was performed including pertinent positives and negatives as documented in the HPI.  Physical Exam :   Constitutional: NAD and appears stated age Neurological: Alert and oriented Psych: Appropriate affect and cooperative There were no vitals taken for this visit.   Comprehensive Musculoskeletal Exam:    Exam of the right knee demonstrates no significant deformity.  Prior TKA scar over the anterior knee.  Minimal to mild effusion present without overlying erythema or warmth.  Patient is seated in a wheelchair and is stable to demonstrate active range of motion from 5 to 100 degrees.  No laxity with varus or valgus stress.  Imaging:     Assessment:   68 y.o. male who has a history of a right TKA revision in 2016.  He is a patient of Dr. Vernetta and was last seen on 10/20 in his clinic with improvements in pain and swelling after a fall the month prior.  He has gradually developed increased pain in the knee which is significant enough today to cause difficulty with weightbearing.  I did evaluate the knee under ultrasound and there was no significant effusion that would warrant aspiration.  No signs or symptoms today concerning for infection.  Patient does have a follow-up already scheduled with Dr. Vernetta on 12/22 for further follow-up.  I will  send patient for short course of tramadol  for pain management and will also try and discuss with Dr. Vernetta to see if there is any workup in terms of labs or imaging needed prior to this visit and can update patient over the phone regarding this.  Plan :    - Follow-up with Dr. Vernetta as scheduled on 12/22     I personally saw and evaluated the patient, and participated in the management and treatment plan.  Leonce Reveal, PA-C Orthopedics

## 2024-07-31 ENCOUNTER — Telehealth: Payer: Self-pay | Admitting: *Deleted

## 2024-07-31 NOTE — Telephone Encounter (Signed)
 Pt scheduled for 08/29/24. Instructions sent via mychart.   [employer] PA for EGD: Approved Authorization #[id number]  Tracking #[id number] Dates of service 08/29/2024 - 11/27/2024

## 2024-07-31 NOTE — Telephone Encounter (Signed)
 Pt's wife Verneita (on dpr) says that the labs that were ordered for pt on 06/07/24, that when they went to [facility] to have labs done and was told that they didn't do certain blood work that was ordered and she said they went to [facility] near [facility] and that office has been closed every time. Pt has an appointment on 08/08/24 to be seen.

## 2024-08-02 ENCOUNTER — Ambulatory Visit (INDEPENDENT_AMBULATORY_CARE_PROVIDER_SITE_OTHER): Admitting: Audiology

## 2024-08-06 ENCOUNTER — Ambulatory Visit: Admitting: Orthopaedic Surgery

## 2024-08-06 ENCOUNTER — Encounter: Payer: Self-pay | Admitting: Orthopaedic Surgery

## 2024-08-06 DIAGNOSIS — M7041 Prepatellar bursitis, right knee: Secondary | ICD-10-CM

## 2024-08-06 DIAGNOSIS — Z96651 Presence of right artificial knee joint: Secondary | ICD-10-CM

## 2024-08-06 DIAGNOSIS — M25561 Pain in right knee: Secondary | ICD-10-CM

## 2024-08-06 NOTE — Progress Notes (Signed)
 Mr. Lemmerman is a 68 year old who is following up with right knee pain.  He is actually 9 years out from a right knee revision arthroplasty.  That has done well until mechanical fall back in September of this year.  We saw him then and aspirated fluid off the knee.  He also had a flareup of gout.  Since then the knee had done well and we did see him in follow-up in October and that he was doing well.  However a week or 2 ago the knee started swelling some again and was having some pain.  He had been hospitalized recently with some GI issues.  He apparently has potential another procedure toward the end of the month.  Exam of his right knee today shows a prepatellar bursa fluid collection that is painful.  There is no redness.  He has good range of motion of the knee but you can tell he is having some pain when he weightbears on the knee.  He said his right knee is still painful but its gotten slightly better since he show Leonce Reveal, PA-C last week.  I was able to clean the knee with alcohol swabs and completely decompress the prepatellar bursa and this gave him immediate pain relief taking fluid off of this bursa.  He had good range of motion afterwards and decreased pain with mobility.  I have recommended a knee sleeve to provide some compression around the knee and follow-up will be as needed.  However if he does have reaccumulation of fluid or worsening pain they know to reach out to us  immediately.  All questions and concerns were addressed and answered.

## 2024-08-08 ENCOUNTER — Ambulatory Visit: Admitting: Gastroenterology

## 2024-08-08 ENCOUNTER — Encounter: Payer: Self-pay | Admitting: Gastroenterology

## 2024-08-08 VITALS — BP 113/68 | HR 112 | Temp 98.4°F | Ht 73.0 in | Wt 229.3 lb

## 2024-08-08 DIAGNOSIS — R161 Splenomegaly, not elsewhere classified: Secondary | ICD-10-CM | POA: Diagnosis not present

## 2024-08-08 DIAGNOSIS — D649 Anemia, unspecified: Secondary | ICD-10-CM | POA: Diagnosis not present

## 2024-08-08 DIAGNOSIS — K760 Fatty (change of) liver, not elsewhere classified: Secondary | ICD-10-CM

## 2024-08-08 DIAGNOSIS — K3189 Other diseases of stomach and duodenum: Secondary | ICD-10-CM | POA: Diagnosis not present

## 2024-08-08 DIAGNOSIS — R7989 Other specified abnormal findings of blood chemistry: Secondary | ICD-10-CM

## 2024-08-08 DIAGNOSIS — F1091 Alcohol use, unspecified, in remission: Secondary | ICD-10-CM

## 2024-08-08 DIAGNOSIS — R7401 Elevation of levels of liver transaminase levels: Secondary | ICD-10-CM | POA: Diagnosis not present

## 2024-08-08 NOTE — Patient Instructions (Addendum)
 Please have blood work done at [facility]. If needed, I will order IV iron for you.  Keep plans for endoscopy upcoming.   Continue to avoid iron as you are doing.  Continue omeprazole  twice a day, 30 minutes before breakfast and dinner.  When you see Dr. Marvine, please discuss with him about a cardiology referral.  We will see you in 3 months regardless!  Have a wonderful Christmas!  I enjoyed seeing you again today! I value our relationship and want to provide genuine, compassionate, and quality care. You may receive a survey regarding your visit with me, and I welcome your feedback! Thanks so much for taking the time to complete this. I look forward to seeing you again.      Therisa MICAEL Stager, PhD, ANP-BC [facility]

## 2024-08-08 NOTE — Progress Notes (Signed)
 "   Gastroenterology Office Note     Primary Care Physician:  Marvine Rush, MD  Primary Gastroenterologist: Dr. Shaaron    Chief Complaint   Chief Complaint  Patient presents with   Follow-up    Patient here today for a follow up on IDA. Patient had an H&H drawn on 07/24/2024 at which time hgb was 9.3 up from 7.8 on 07/20/2024. Patient reports he had a blood transfusion. Denies any current anemia symptoms, and no dark or bloody stools have been seen. Patient no longer taking Po Fe.      History of Present Illness   Jonathan Grimes is a 68 y.o. male presenting today with a history of long-standing IDA, hepatic steatosis, prior alcohol use, symptomatic hemorrhoids s/p banding earlier this year, recently hospitalized Nov 2025 following outpatient visit due to symptomatic anemia. He was admitted with UGI bleed and underwent EGD/dilation as below. He will need early interval EGD. Notably, he was also found to have mild to moderate aortic valve stenosis on ECHO as outpatient. Cardiology reviewed his chart while inpatient and saw no cardiac reasons to prohibit endoscopic evaluation. No ACS.   EGD Nov 2025 while inpatient: empiric dilation with ballon dilator up to 18 mm,, friable gastric mucosa s/p multiple biopsies, s/p APC therapy, erosive gastropathy, normal duodenum. Needs early interval EGD to reassess healing and rule out malignancy. Pathology with reactive gastropathy and iron pill gastritis. Recommended to take BID PPI, stop oral iron, and repeat EGD in 4-6 weeks.   He received 1 unit PRBCs while inpatient. Discharge Hgb 7.8, baseline recently around 9/10. 11/25 Hgb 9.3. He has stopped oral iron since pathology results. If need for IV iron, he would rather have this done at Dr. Aloma office.   Seeing Dr. Marvine in Jan and will wait to see him before being referred to Cardiology. I offered to refer him, but he would like to review this first with PCP to keep things streamlined.     Had been taking diclofenac. Now off of this. Not on oral iron any longer. Omeprazole  40 mg BID. Has upcoming EGD on 12/31. No abdominal pain. No N/V.    EGD Nov 2025 while inpatient: empiric dilation with ballon dilator up to 18 mm,, friable gastric mucosa s/p multiple biopsies, s/p APC therapy, erosive gastropathy, normal duodenum. Early interval EGD to reassess healing and rule out malignancy.   Path: reactive gastropathy with iron pill gastritis, recommending to take BID PPI, stop oral iron, repeat EGD in 4-6 weeks.    Last colonoscopy May 2025: non-bleeding external and internal hemorrhoids, moderate diverticulosis in sigmoid and descending, redundant colon, otherwise normal    EGD 2017: MFM:jwumjo erosiona s/p bx (no h.pylri, gastric mucosa with intestinal metaplasia)    Givens 2017: scattered erosions in in stomach, small bowel unremrakable.   Past Medical History:  Diagnosis Date   Arthritis    SEVERE OA LEFT HIP WITH PAIN   Diabetes mellitus without complication (HCC)    type 2   Elevated cholesterol    Fatty liver    GERD (gastroesophageal reflux disease)    H/O hiatal hernia    Hiatal hernia    Hypertension    Schatzki's ring    Sleep apnea    Tubular adenoma     Past Surgical History:  Procedure Laterality Date   BIOPSY N/A 10/02/2015   Procedure: BIOPSY;  Surgeon: Lamar CHRISTELLA Shaaron, MD;  Location: [facility];  Service: Endoscopy;  Laterality: N/A;  Gastric bx  COLONOSCOPY  01/19/2010   Dr.Rourk- friable anorectal. o/w normal rectum, L sided diverticula, sigmoid polyp bx= tubular adenoma,   COLONOSCOPY N/A 10/02/2015   MFM:wnmfjo appearing rectal/scattered sigmoid diverticula, next TCS five years for h/o colon adenomas   COLONOSCOPY N/A 01/04/2024   Procedure: COLONOSCOPY;  Surgeon: Shaaron Lamar HERO, MD;  [facility]: [facility];  Service: Endoscopy;  Laterality: N/A;  730AM, ASA 3   COLONOSCOPY WITH PROPOFOL  N/A 01/04/2022   Procedure: COLONOSCOPY WITH  PROPOFOL ;  Surgeon: Shaaron Lamar HERO, MD;  [facility]: [facility];  Service: Endoscopy;  Laterality: N/A;  7:30 / ASA 2   ESOPHAGEAL DILATION N/A 07/19/2024   Procedure: DILATION, ESOPHAGUS;  Surgeon: Cinderella Deatrice FALCON, MD;  [facility]: [facility];  Service: Endoscopy;  Laterality: N/A;   ESOPHAGOGASTRODUODENOSCOPY  01/19/2010   Dr.Rourk- noncritical schatzki's ring, o/w normal esophagus, small hiatal hernia o/w normal stomach D1, D2   ESOPHAGOGASTRODUODENOSCOPY N/A 10/02/2015   MFM:jwumjo erosiona s/p bx (no h.pylri, gastric mucosa with intestinal metaplasia)   ESOPHAGOGASTRODUODENOSCOPY N/A 07/19/2024   Procedure: EGD (ESOPHAGOGASTRODUODENOSCOPY);  Surgeon: Cinderella Deatrice FALCON, MD;  [facility];  Service: Endoscopy;  Laterality: N/A;   GIVENS CAPSULE STUDY N/A 11/21/2015   Procedure: GIVENS CAPSULE STUDY;  Surgeon: Lamar HERO Shaaron, MD;  [facility]: [facility];  Service: Endoscopy;  Laterality: N/A;  0700   JOINT REPLACEMENT  08/30/2010   RIGHT TOTAL KNEE ARTHROPLASTY   TOTAL HIP ARTHROPLASTY  07/28/2012   Procedure: TOTAL HIP ARTHROPLASTY ANTERIOR APPROACH;  Surgeon: Lonni CINDERELLA Poli, MD;  Location: [facility];  Service: Orthopedics;  Laterality: Left;   TOTAL HIP ARTHROPLASTY Right 02/22/2014   Procedure: RIGHT TOTAL HIP ARTHROPLASTY ANTERIOR APPROACH;  Surgeon: Lonni CINDERELLA Poli, MD;  Location: [facility];  Service: Orthopedics;  Laterality: Right;   TOTAL KNEE REVISION Right 07/11/2015   Procedure: REVISION RIGHT TOTAL KNEE ARTHROPLASTYone component only;  Surgeon: Lonni CINDERELLA Poli, MD;  Location: [facility];  Service: Orthopedics;  Laterality: Right;    Current Outpatient Medications  Medication Sig Dispense Refill   allopurinol  (ZYLOPRIM ) 100 MG tablet Take 100 mg by mouth daily. (Patient taking differently: Take 200 mg by mouth daily.)     atorvastatin  (LIPITOR) 10 MG tablet 1 tablet Orally Once a day for 30 day(s)     escitalopram  (LEXAPRO ) 10 MG tablet Take 10 mg by  mouth daily.     fluticasone  (FLONASE ) 50 MCG/ACT nasal spray Place 2 sprays into both nostrils daily. 16 g 6   levothyroxine  (SYNTHROID ) 50 MCG tablet Take 50 mcg by mouth daily.     loratadine  (CLARITIN ) 10 MG tablet Take 1 tablet (10 mg total) by mouth daily. 90 tablet 11   omeprazole  (PRILOSEC) 40 MG capsule Take 1 capsule (40 mg total) by mouth 2 (two) times daily. 60 capsule 1   No current facility-administered medications for this visit.    Allergies as of 08/08/2024   (No Known Allergies)    History reviewed. No pertinent family history.  Social History   Socioeconomic History   Marital status: Married    Spouse name: Not on file   Number of children: Not on file   Years of education: Not on file   Highest education level: Not on file  Occupational History   Not on file  Tobacco Use   Smoking status: Former    Current packs/day: 0.00    Types: Cigarettes    Quit date: 01/06/1982    Years since quitting: 42.6   Smokeless  tobacco: Current    Types: Chew   Tobacco comments:    QUIT SMOKING 34 YRS AGO  QUIT CHEWING TOBACCO JAN 2012  Vaping Use   Vaping status: Never Used  Substance and Sexual Activity   Alcohol use: Yes    Comment: beer occasionally   Drug use: No   Sexual activity: Not on file  Other Topics Concern   Not on file  Social History Narrative   Not on file   Social Drivers of Health   Financial Resource Strain: Not on file  Food Insecurity: No Food Insecurity (07/18/2024)   Hunger Vital Sign    Worried About Running Out of Food in the Last Year: Never true    Ran Out of Food in the Last Year: Never true  Transportation Needs: No Transportation Needs (07/18/2024)   PRAPARE - Administrator, Civil Service (Medical): No    Lack of Transportation (Non-Medical): No  Physical Activity: Not on file  Stress: Not on file  Social Connections: Moderately Integrated (07/18/2024)   Social Connection and Isolation Panel    Frequency of  Communication with Friends and Family: Twice a week    Frequency of Social Gatherings with Friends and Family: Twice a week    Attends Religious Services: 1 to 4 times per year    Active Member of [employer] or Organizations: No    Attends Banker Meetings: Never    Marital Status: Married  Catering Manager Violence: Not At Risk (07/18/2024)   Humiliation, Afraid, Rape, and Kick questionnaire    Fear of Current or Ex-Partner: No    Emotionally Abused: No    Physically Abused: No    Sexually Abused: No     Review of Systems   Gen: Denies any fever, chills, fatigue, weight loss, lack of appetite.  CV: Denies chest pain, heart palpitations, peripheral edema, syncope.  Resp: Denies shortness of breath at rest or with exertion. Denies wheezing or cough.  GI: Denies dysphagia or odynophagia. Denies jaundice, hematemesis, fecal incontinence. GU : Denies urinary burning, urinary frequency, urinary hesitancy MS: Denies joint pain, muscle weakness, cramps, or limitation of movement.  Derm: Denies rash, itching, dry skin Psych: Denies depression, anxiety, memory loss, and confusion Heme: Denies bruising, bleeding, and enlarged lymph nodes.   Physical Exam   BP 113/68 (BP Location: Left Arm, Patient Position: Sitting, Cuff Size: Large)   Pulse (!) 112   Temp 98.4 F (36.9 C) (Temporal)   Ht 6' 1 (1.854 m)   Wt 229 lb 4.8 oz (104 kg)   BMI 30.25 kg/m  General:   Alert and oriented. Pleasant and cooperative. Well-nourished and well-developed.  Head:  Normocephalic and atraumatic. Eyes:  Without icterus Abdomen:  +BS, soft, non-tender and non-distended. No HSM noted. No guarding or rebound. No masses appreciated.  Rectal:  Deferred  Msk:  Symmetrical without gross deformities. Normal posture. Extremities:  Without edema. Neurologic:  Alert and  oriented x4;  grossly normal neurologically. Skin:  Intact without significant lesions or rashes. Psych:  Alert and cooperative.  Normal mood and affect.   Assessment   Jonathan Grimes is a 68 y.o. male presenting today with a history of long-standing IDA, hepatic steatosis, prior alcohol use, symptomatic hemorrhoids s/p banding earlier this year, symptomatic anemia in Nov 2025 due to UGI bleed secondary to erosive gastropathy, friable mucosa, returning today in hospital follow-up.   Erosive gastropathy, friable gastric mucosa s/p multiple biopsies and APC. Path without malignancy. Needs early  interval EGD to reassess as per plan and biopsy further to reassess healing and rule out underlying malignancy.   Symptomatic anemia: receiving 1 unit PRBCs during hospitalization, most recent Hgb 9.3, which looks to be near his baseline. Colonoscopy uptodate as of May 2025 unrevealing. Last capsule due to IDA with unremarkable small bowel. He is much improved today. Will check  CBC and iron studies in near future. May need IV iron.   Hepatic steatosis: US  with fatty liver, spleen mildly enlarged, applauded on cessation of ETOH. Elevated transaminases, normal platelets. Recheck CMP, check viral serologies, ELF. If persistently elevated transaminases despite continued ETOH cessation, will check extensive serologies.    PLAN    Early interval EGD to reassess gastric mucosa, further biopsies, r/o occult malignancy Continue PPI BID CMP, iron studies, viral serologies, ELF Continue alcohol cessation and applauded on this If needs IV iron, he would prefer to have this done in [place] at his Rheumatologist. Holding on oral iron right now in light of iron pill gastritis 3 month follow-up    Therisa MICAEL Stager, PhD, ANP-BC [facility]    "

## 2024-08-09 LAB — IRON,TIBC AND FERRITIN PANEL
Ferritin: 148 ng/mL (ref 30–400)
Iron Saturation: 14 % — ABNORMAL LOW (ref 15–55)
Iron: 42 ug/dL (ref 38–169)
Total Iron Binding Capacity: 307 ug/dL (ref 250–450)
UIBC: 265 ug/dL (ref 111–343)

## 2024-08-09 LAB — CBC WITH DIFFERENTIAL/PLATELET
Basophils Absolute: 0 x10E3/uL (ref 0.0–0.2)
Basos: 1 %
EOS (ABSOLUTE): 0.8 x10E3/uL — ABNORMAL HIGH (ref 0.0–0.4)
Eos: 11 %
Hematocrit: 31.9 % — ABNORMAL LOW (ref 37.5–51.0)
Hemoglobin: 9.7 g/dL — ABNORMAL LOW (ref 13.0–17.7)
Immature Grans (Abs): 0 x10E3/uL (ref 0.0–0.1)
Immature Granulocytes: 0 %
Lymphocytes Absolute: 1.5 x10E3/uL (ref 0.7–3.1)
Lymphs: 20 %
MCH: 29.3 pg (ref 26.6–33.0)
MCHC: 30.4 g/dL — ABNORMAL LOW (ref 31.5–35.7)
MCV: 96 fL (ref 79–97)
Monocytes Absolute: 0.7 x10E3/uL (ref 0.1–0.9)
Monocytes: 9 %
Neutrophils Absolute: 4.4 x10E3/uL (ref 1.4–7.0)
Neutrophils: 59 %
Platelets: 401 x10E3/uL (ref 150–450)
RBC: 3.31 x10E6/uL — ABNORMAL LOW (ref 4.14–5.80)
RDW: 13.2 % (ref 11.6–15.4)
WBC: 7.5 x10E3/uL (ref 3.4–10.8)

## 2024-08-09 LAB — COMPREHENSIVE METABOLIC PANEL WITH GFR
ALT: 31 IU/L (ref 0–44)
AST: 43 IU/L — ABNORMAL HIGH (ref 0–40)
Albumin: 3.6 g/dL — ABNORMAL LOW (ref 3.9–4.9)
Alkaline Phosphatase: 457 IU/L — ABNORMAL HIGH (ref 47–123)
BUN/Creatinine Ratio: 16 (ref 10–24)
BUN: 24 mg/dL (ref 8–27)
Bilirubin Total: 0.3 mg/dL (ref 0.0–1.2)
CO2: 18 mmol/L — ABNORMAL LOW (ref 20–29)
Calcium: 9.6 mg/dL (ref 8.6–10.2)
Chloride: 103 mmol/L (ref 96–106)
Creatinine, Ser: 1.53 mg/dL — ABNORMAL HIGH (ref 0.76–1.27)
Globulin, Total: 3.7 g/dL (ref 1.5–4.5)
Glucose: 119 mg/dL — ABNORMAL HIGH (ref 70–99)
Potassium: 5.1 mmol/L (ref 3.5–5.2)
Sodium: 140 mmol/L (ref 134–144)
Total Protein: 7.3 g/dL (ref 6.0–8.5)
eGFR: 49 mL/min/1.73 — ABNORMAL LOW (ref >59)

## 2024-08-09 LAB — B12 AND FOLATE PANEL
Folate: 13.7 ng/mL (ref >3.0)
Vitamin B-12: 1185 pg/mL (ref 232–1245)

## 2024-08-09 LAB — ENHANCED LIVER FIBROSIS (ELF): ELF(TM) Score: 13.05 — ABNORMAL HIGH (ref <9.80)

## 2024-08-09 LAB — HEPATITIS B SURFACE ANTIGEN: Hepatitis B Surface Ag: NEGATIVE

## 2024-08-09 LAB — HEPATITIS A ANTIBODY, TOTAL: hep A Total Ab: POSITIVE — AB

## 2024-08-09 LAB — HEPATITIS B SURFACE ANTIBODY,QUALITATIVE: Hep B Surface Ab, Qual: NONREACTIVE

## 2024-08-09 LAB — HEPATITIS C ANTIBODY: Hep C Virus Ab: NONREACTIVE

## 2024-08-20 ENCOUNTER — Ambulatory Visit: Admitting: Orthopaedic Surgery

## 2024-08-27 ENCOUNTER — Encounter (HOSPITAL_COMMUNITY)
Admission: RE | Admit: 2024-08-27 | Discharge: 2024-08-27 | Disposition: A | Discharge status: Home / Self Care | Source: Ambulatory Visit | Attending: Internal Medicine | Admitting: Internal Medicine

## 2024-08-27 ENCOUNTER — Other Ambulatory Visit: Payer: Self-pay

## 2024-08-27 ENCOUNTER — Encounter (HOSPITAL_COMMUNITY): Payer: Self-pay

## 2024-08-27 NOTE — Progress Notes (Signed)
" °   08/27/24 0913  OBSTRUCTIVE SLEEP APNEA  Have you ever been diagnosed with sleep apnea through a sleep study? No  Do you snore loudly (loud enough to be heard through closed doors)?  1  Do you often feel tired, fatigued, or sleepy during the daytime (such as falling asleep during driving or talking to someone)? 0  Has anyone observed you stop breathing during your sleep? 1  Do you have, or are you being treated for high blood pressure? 1  BMI more than 35 kg/m2? 0  Age > 50 (1-yes) 1  Neck circumference greater than:Male 16 inches or larger, Male 17inches or larger? 0  Male Gender (Yes=1) 1  Obstructive Sleep Apnea Score 5  Score 5 or greater  Results sent to PCP    "

## 2024-08-29 ENCOUNTER — Ambulatory Visit: Payer: Self-pay | Admitting: Gastroenterology

## 2024-08-29 ENCOUNTER — Encounter (HOSPITAL_COMMUNITY)
Admission: RE | Disposition: A | Discharge status: Home / Self Care | Payer: Self-pay | Source: Home / Self Care | Attending: Internal Medicine

## 2024-08-29 ENCOUNTER — Ambulatory Visit (HOSPITAL_COMMUNITY)

## 2024-08-29 ENCOUNTER — Other Ambulatory Visit: Payer: Self-pay

## 2024-08-29 ENCOUNTER — Encounter (HOSPITAL_COMMUNITY): Payer: Self-pay | Admitting: Internal Medicine

## 2024-08-29 ENCOUNTER — Ambulatory Visit (HOSPITAL_COMMUNITY)
Admission: RE | Admit: 2024-08-29 | Discharge: 2024-08-29 | Disposition: A | Discharge status: Home / Self Care | Source: Home / Self Care | Attending: Internal Medicine | Admitting: Internal Medicine

## 2024-08-29 DIAGNOSIS — K317 Polyp of stomach and duodenum: Secondary | ICD-10-CM

## 2024-08-29 DIAGNOSIS — E119 Type 2 diabetes mellitus without complications: Secondary | ICD-10-CM | POA: Insufficient documentation

## 2024-08-29 DIAGNOSIS — Z7984 Long term (current) use of oral hypoglycemic drugs: Secondary | ICD-10-CM | POA: Insufficient documentation

## 2024-08-29 DIAGNOSIS — D509 Iron deficiency anemia, unspecified: Secondary | ICD-10-CM | POA: Diagnosis present

## 2024-08-29 DIAGNOSIS — K2960 Other gastritis without bleeding: Secondary | ICD-10-CM | POA: Diagnosis not present

## 2024-08-29 DIAGNOSIS — K2970 Gastritis, unspecified, without bleeding: Secondary | ICD-10-CM | POA: Insufficient documentation

## 2024-08-29 DIAGNOSIS — I10 Essential (primary) hypertension: Secondary | ICD-10-CM | POA: Insufficient documentation

## 2024-08-29 DIAGNOSIS — R161 Splenomegaly, not elsewhere classified: Secondary | ICD-10-CM | POA: Diagnosis not present

## 2024-08-29 DIAGNOSIS — K3189 Other diseases of stomach and duodenum: Secondary | ICD-10-CM

## 2024-08-29 DIAGNOSIS — G4730 Sleep apnea, unspecified: Secondary | ICD-10-CM | POA: Insufficient documentation

## 2024-08-29 DIAGNOSIS — I350 Nonrheumatic aortic (valve) stenosis: Secondary | ICD-10-CM | POA: Diagnosis not present

## 2024-08-29 DIAGNOSIS — K766 Portal hypertension: Secondary | ICD-10-CM | POA: Diagnosis not present

## 2024-08-29 HISTORY — PX: ESOPHAGOGASTRODUODENOSCOPY: SHX5428

## 2024-08-29 LAB — GLUCOSE, CAPILLARY: Glucose-Capillary: 112 mg/dL — ABNORMAL HIGH (ref 70–99)

## 2024-08-29 SURGERY — EGD (ESOPHAGOGASTRODUODENOSCOPY)
Anesthesia: General

## 2024-08-29 MED ORDER — PROPOFOL 500 MG/50ML IV EMUL
INTRAVENOUS | Status: DC | PRN
  Administered 2024-08-29: 40 mg via INTRAVENOUS
  Administered 2024-08-29: 60 mg via INTRAVENOUS
  Administered 2024-08-29: 125 ug/kg/min via INTRAVENOUS

## 2024-08-29 MED ORDER — LIDOCAINE 2% (20 MG/ML) 5 ML SYRINGE
INTRAMUSCULAR | Status: DC | PRN
  Administered 2024-08-29: 40 mg via INTRAVENOUS
  Administered 2024-08-29: 60 mg via INTRAVENOUS

## 2024-08-29 MED ORDER — PHENYLEPHRINE 80 MCG/ML (10ML) SYRINGE FOR IV PUSH (FOR BLOOD PRESSURE SUPPORT)
PREFILLED_SYRINGE | INTRAVENOUS | Status: DC | PRN
  Administered 2024-08-29 (×4): 80 ug via INTRAVENOUS

## 2024-08-29 MED ORDER — LACTATED RINGERS IV SOLN: INTRAVENOUS | Status: DC | PRN

## 2024-08-29 MED ORDER — LACTATED RINGERS IV SOLN: INTRAVENOUS | Status: DC

## 2024-08-29 NOTE — Transfer of Care (Addendum)
 Immediate Anesthesia Transfer of Care Note  Patient: Jonathan Grimes  Procedure(s) Performed: EGD (ESOPHAGOGASTRODUODENOSCOPY)  Patient Location: Short Stay  Anesthesia Type:MAC  Level of Consciousness: drowsy and patient cooperative  Airway & Oxygen Therapy: Patient Spontanous Breathing  Post-op Assessment: Report given to RN and Post -op Vital signs reviewed and stable  Post vital signs: Reviewed and stable  Last Vitals:  Vitals Value Taken Time  BP 124/69 08/29/24   13:59  Temp 36.8 08/29/24   13:59  Pulse 78 08/29/24   13:59  Resp 20 08/29/24   13:59  SpO2 97% 08/29/24   13:59    Last Pain:  Vitals:   08/29/24 1341  TempSrc:   PainSc: 0-No pain         Complications: No notable events documented.

## 2024-08-29 NOTE — Discharge Instructions (Addendum)
 EGD Discharge instructions Please read the instructions outlined below and refer to this sheet in the next few weeks. These discharge instructions provide you with general information on caring for yourself after you leave the hospital. Your doctor may also give you specific instructions. While your treatment has been planned according to the most current medical practices available, unavoidable complications occasionally occur. If you have any problems or questions after discharge, please call your doctor. ACTIVITY You may resume your regular activity but move at a slower pace for the next 24 hours.  Take frequent rest periods for the next 24 hours.  Walking will help expel (get rid of) the air and reduce the bloated feeling in your abdomen.  No driving for 24 hours (because of the anesthesia (medicine) used during the test).  You may shower.  Do not sign any important legal documents or operate any machinery for 24 hours (because of the anesthesia used during the test).  NUTRITION Drink plenty of fluids.  You may resume your normal diet.  Begin with a light meal and progress to your normal diet.  Avoid alcoholic beverages for 24 hours or as instructed by your caregiver.  MEDICATIONS You may resume your normal medications unless your caregiver tells you otherwise.  WHAT YOU CAN EXPECT TODAY You may experience abdominal discomfort such as a feeling of fullness or gas pains.  FOLLOW-UP Your doctor will discuss the results of your test with you.  SEEK IMMEDIATE MEDICAL ATTENTION IF ANY OF THE FOLLOWING OCCUR: Excessive nausea (feeling sick to your stomach) and/or vomiting.  Severe abdominal pain and distention (swelling).  Trouble swallowing.  Temperature over 101 F (37.8 C).  Rectal bleeding or vomiting of blood.    Your esophagus appeared normal.  Your stomach has signs consistent with chronic liver disease.  I did not see a tumor.  I did take biopsies.  A small nodule was biopsied  separately.  Further recommendations to follow pending review of pathology report  Appointment to follow-up with Therisa Stager in 6 weeks.  At patient request, I spoke to Eris Breck at [phone] reviewed findings and recommendations

## 2024-08-29 NOTE — Interval H&P Note (Signed)
 History and Physical Interval Note:  08/29/2024 1:28 PM  Jonathan Grimes  has presented today for surgery, with the diagnosis of iron pill gastritis.  The various methods of treatment have been discussed with the patient and family. After consideration of risks, benefits and other options for treatment, the patient has consented to  Procedures with comments: EGD (ESOPHAGOGASTRODUODENOSCOPY) (N/A) - 11:30 am, asa 4 as a surgical intervention.  The patient's history has been reviewed, patient examined, no change in status, stable for surgery.  I have reviewed the patient's chart and labs.  Questions were answered to the patient's satisfaction.     Lamar Hollingshead

## 2024-08-29 NOTE — Anesthesia Procedure Notes (Signed)
 Date/Time: 08/29/2024 1:39 PM  Performed by: Para Jerelene CROME, CRNAOxygen Delivery Method: Nasal cannula Comments: OptiFlow Nasal Cannula.

## 2024-08-29 NOTE — Op Note (Signed)
 [facility] Patient Name: Jonathan Grimes Procedure Date: 08/29/2024 12:37 PM MRN: [id number] Date of Birth: Jan 10, 1956 Attending MD: Lamar Ozell Hollingshead , MD, [id number] CSN: [id number] Age: 68 Admit Type: Outpatient Procedure:                Upper GI endoscopy Indications:              Surveillance procedure, , Iron deficiency anemia;                            abnormal gastric mucosa seen previously. Providers:                Lamar Ozell Hollingshead, MD, Jon FELIX Gerome RN, RN,                            Dorcas Lenis, Technician Referring MD:              Medicines:                Propofol  per Anesthesia Complications:            No immediate complications. Estimated Blood Loss:     Estimated blood loss was minimal. Estimated blood                            loss was minimal. Procedure:                Pre-Anesthesia Assessment:                           - Prior to the procedure, a History and Physical                            was performed, and patient medications and                            allergies were reviewed. The patient's tolerance of                            previous anesthesia was also reviewed. The risks                            and benefits of the procedure and the sedation                            options and risks were discussed with the patient.                            All questions were answered, and informed consent                            was obtained. Prior Anticoagulants: The patient has                            taken no anticoagulant or antiplatelet agents. ASA  Grade Assessment: III - A patient with severe                            systemic disease. After reviewing the risks and                            benefits, the patient was deemed in satisfactory                            condition to undergo the procedure.                           After obtaining informed consent, the endoscope was                             passed under direct vision. Throughout the                            procedure, the patient's blood pressure, pulse, and                            oxygen saturations were monitored continuously. The                            [id number] ([id number]) Upper was introduced through                            the mouth, and advanced to the second part of                            duodenum. The upper GI endoscopy was accomplished                            without difficulty. The patient tolerated the                            procedure well. Scope In: 1:45:32 PM Scope Out: 1:52:45 PM Total Procedure Duration: 0 hours 7 minutes 13 seconds  Findings:      The examined esophagus was normal.      Diffuse mucosal changes consistent with portal hypertensive gastropathy       with relative sparing in the antrum. More pronounced along the greater       curvature. No ulcer or infiltrating process seen. No gastric varices       seen. 3 mm protuberant nodule in the superior aspect of the antrum.       Please see photos. I examined the gastric mucosa very carefully. No       other findings seen. I did take some biopsies of the greater curvature       to confirm H. pylori status. The antral nodule was removed with cold       biopsy forceps.      The duodenal bulb and second portion of the duodenum were normal. Impression:               Diffuse changes of portal hypertensive gastropathy  with relative sparing of the antrum                           - Suspect patient has underlying chronic liver                            disease. He has known splenomegaly and echodense                            liver on CT. 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                           - Normal esophagus. Gastric biopsies taken. Antral                            nodule removed with cold biopsy forceps.                           - Normal duodenal bulb and second portion of the                             duodenum.                           - Moderate Sedation:      Moderate (conscious) sedation was personally administered by an       anesthesia professional. The following parameters were monitored: oxygen       saturation, heart rate, blood pressure, respiratory rate, EKG, adequacy       of pulmonary ventilation, and response to care. Recommendation:           - Patient has a contact number available for                            emergencies. The signs and symptoms of potential                            delayed complications were discussed with the                            patient. Return to normal activities tomorrow.                            Written discharge instructions were provided to the                            patient.                           - Advance diet as tolerated.                           - Continue present medications.                           - Return to my office in 6 weeks for reassessment.  Procedure Code(s):        --- Professional ---                           [phone], Esophagogastroduodenoscopy, flexible,                            transoral; diagnostic, including collection of                            specimen(s) by brushing or washing, when performed                            (separate procedure) Diagnosis Code(s):        --- Professional ---                           D50.9, Iron deficiency anemia, unspecified CPT copyright 2022 American Medical Association. All rights reserved. The codes documented in this report are preliminary and upon coder review may  be revised to meet current compliance requirements. Lamar HERO. Kipp Shank, MD Lamar Ozell Hollingshead, MD 08/29/2024 2:52:47 PM This report has been signed electronically. Number of Addenda: 0

## 2024-08-29 NOTE — Anesthesia Postprocedure Evaluation (Signed)
"   Anesthesia Post Note  Patient: Jonathan Grimes  Procedure(s) Performed: EGD (ESOPHAGOGASTRODUODENOSCOPY)  Patient location during evaluation: PACU Anesthesia Type: General Level of consciousness: awake and alert Pain management: pain level controlled Vital Signs Assessment: post-procedure vital signs reviewed and stable Respiratory status: spontaneous breathing, nonlabored ventilation, respiratory function stable and patient connected to nasal cannula oxygen Cardiovascular status: blood pressure returned to baseline and stable Postop Assessment: no apparent nausea or vomiting Anesthetic complications: no   No notable events documented.   Last Vitals:  Vitals:   08/29/24 1122 08/29/24 1359  BP: 131/78 124/69  Pulse: 83 77  Resp: 19 20  Temp: 36.7 C 36.8 C  SpO2: 98% 97%    Last Pain:  Vitals:   08/29/24 1359  TempSrc: Oral  PainSc: 0-No pain                 Andrea Limes      "

## 2024-08-29 NOTE — Anesthesia Preprocedure Evaluation (Addendum)
"                                    Anesthesia Evaluation  Patient identified by MRN, date of birth, ID band Patient awake  General Assessment Comment:Chewing tobacco at 0500  Reviewed: Allergy & Precautions, H&P , NPO status , Patient's Chart, lab work & pertinent test results  Airway Mallampati: III  TM Distance: >3 FB Neck ROM: Full    Dental no notable dental hx.    Pulmonary shortness of breath, sleep apnea , former smoker   Pulmonary exam normal breath sounds clear to auscultation       Cardiovascular hypertension, + DOE  Normal cardiovascular exam+ Valvular Problems/Murmurs AS  Rhythm:Regular Rate:Normal  Mild-moderate AS Nl EF   Neuro/Psych  Neuromuscular disease  negative psych ROS   GI/Hepatic Neg liver ROS, hiatal hernia, PUD,GERD  ,,  Endo/Other  diabetes, Type 2, Oral Hypoglycemic Agents    Renal/GU negative Renal ROS  negative genitourinary   Musculoskeletal negative musculoskeletal ROS (+)    Abdominal   Peds negative pediatric ROS (+)  Hematology negative hematology ROS (+)   Anesthesia Other Findings   Reproductive/Obstetrics negative OB ROS                              Anesthesia Physical Anesthesia Plan  ASA: 3  Anesthesia Plan: General   Post-op Pain Management:    Induction: Intravenous  PONV Risk Score and Plan:   Airway Management Planned: Nasal Cannula and Natural Airway  Additional Equipment:   Intra-op Plan:   Post-operative Plan:   Informed Consent: I have reviewed the patients History and Physical, chart, labs and discussed the procedure including the risks, benefits and alternatives for the proposed anesthesia with the patient or authorized representative who has indicated his/her understanding and acceptance.     Dental advisory given  Plan Discussed with: CRNA  Anesthesia Plan Comments:          Anesthesia Quick Evaluation  "

## 2024-08-29 NOTE — Progress Notes (Signed)
 Jonathan Grimes, your hemoglobin is stable/slightly improved to 9.7. your kidney function is improving from hospitalization.   Your alkaline phosphatase is 457, which is high and more elevated than when in the hospital. Your AST is mildly elevated at 43. You have not had a high alkaline phosphatase other than when in the hospital, so we need to investigate this further. It can be elevated for many reasons, so we will sort that out.   No B12 or folate deficiency. Ferritin is improved and iron is low normal. We will need to keep tabs on your iron and blood counts to ensure no slow oozing ongoing.  You are negative for Hep B and C. You are immune to Hep A.  With your bump in alkaline phosphatase, I would like to recheck this along with a few other labs but need to make sure you are fasting. You have a fairly recent ultrasound as of October, but depending on what these updated labs show, we may need to do more in depth imaging.   The fibrosis score is high, and we will talk about that also in clinic.   At some point, you will need Hepatitis B vaccination, but we can talk about that in clinic when I see you!  Sent in [place]. MindyTammy: He actually has an endoscopy on 12/31. Can we reach out to nursing staff in endo to see if labs can be ordered under my name while he is there and fasting? If so, I will order them for there. If not, I will order for outpatient [facility].

## 2024-08-29 NOTE — Interval H&P Note (Signed)
 History and Physical Interval Note:  08/29/2024 1:31 PM  Kalim Kissel  has presented today for surgery, with the diagnosis of iron pill gastritis.  The various methods of treatment have been discussed with the patient and family. After consideration of risks, benefits and other options for treatment, the patient has consented to  Procedures with comments: EGD (ESOPHAGOGASTRODUODENOSCOPY) (N/A) - 11:30 am, asa 4 as a surgical intervention.  The patient's history has been reviewed, patient examined, no change in status, stable for surgery.  I have reviewed the patient's chart and labs.  Questions were answered to the patient's satisfaction.     Lamar Hollingshead  Patient here for follow-up EGD to evaluate the abnormal gastric mucosa seen previously.  I have offered the patient EGD today.  The risks, benefits, limitations, alternatives and imponderables have been reviewed with the patient. Potential for esophageal dilation, biopsy, etc. have also been reviewed.  Questions have been answered. All parties agreeable.

## 2024-08-29 NOTE — H&P (View-Only) (Signed)
 Jonathan Grimes, your hemoglobin is stable/slightly improved to 9.7. your kidney function is improving from hospitalization.   Your alkaline phosphatase is 457, which is high and more elevated than when in the hospital. Your AST is mildly elevated at 43. You have not had a high alkaline phosphatase other than when in the hospital, so we need to investigate this further. It can be elevated for many reasons, so we will sort that out.   No B12 or folate deficiency. Ferritin is improved and iron is low normal. We will need to keep tabs on your iron and blood counts to ensure no slow oozing ongoing.  You are negative for Hep B and C. You are immune to Hep A.  With your bump in alkaline phosphatase, I would like to recheck this along with a few other labs but need to make sure you are fasting. You have a fairly recent ultrasound as of October, but depending on what these updated labs show, we may need to do more in depth imaging.   The fibrosis score is high, and we will talk about that also in clinic.   At some point, you will need Hepatitis B vaccination, but we can talk about that in clinic when I see you!  Sent in [place]. MindyTammy: He actually has an endoscopy on 12/31. Can we reach out to nursing staff in endo to see if labs can be ordered under my name while he is there and fasting? If so, I will order them for there. If not, I will order for outpatient [facility].

## 2024-09-03 ENCOUNTER — Encounter (HOSPITAL_COMMUNITY): Payer: Self-pay | Admitting: Internal Medicine

## 2024-09-03 ENCOUNTER — Ambulatory Visit: Payer: Self-pay | Admitting: Internal Medicine

## 2024-09-03 LAB — SURGICAL PATHOLOGY

## 2024-09-19 ENCOUNTER — Ambulatory Visit: Admitting: Orthopaedic Surgery

## 2024-09-19 ENCOUNTER — Encounter: Payer: Self-pay | Admitting: Orthopaedic Surgery

## 2024-09-19 DIAGNOSIS — M7041 Prepatellar bursitis, right knee: Secondary | ICD-10-CM | POA: Diagnosis not present

## 2024-09-19 DIAGNOSIS — Z96651 Presence of right artificial knee joint: Secondary | ICD-10-CM

## 2024-09-19 MED ORDER — HYDROCODONE-ACETAMINOPHEN 5-325 MG PO TABS: 1.0000 | ORAL_TABLET | Freq: Four times a day (QID) | ORAL | 0 refills | Status: AC | PRN

## 2024-09-19 MED ORDER — DOXYCYCLINE HYCLATE 100 MG PO TABS: 100.0000 mg | ORAL_TABLET | Freq: Two times a day (BID) | ORAL | 0 refills | Status: AC

## 2024-09-19 NOTE — Progress Notes (Signed)
 The patient is a 69 year old gentleman well-known to us .  He comes in today with swelling of his right knee.  This was a revision knee arthroplasty.  We have drained fluid off of the prepatellar bursa area over 4 months ago.  On exam there is redness and swelling of the prepatellar bursa area and a large fluid collection.  I cleaned this area with alcohol swabs and then decompressed a large amount of fluid off of the prepatellar area.  We will send that fluid off for Gram stain and culture.  We will start him on doxycycline  and I will send in hydrocodone  for pain.  We will see him back in a week for reassessment.  He and his wife understand this may end up needing a surgical intervention.  If things worsen they know to contact us .

## 2024-09-19 NOTE — Addendum Note (Signed)
 Addended by: EILLEEN ROSINA HERO on: 09/19/2024 09:24 AM   Modules accepted: Orders

## 2024-09-24 ENCOUNTER — Ambulatory Visit: Admitting: Orthopaedic Surgery

## 2024-09-25 ENCOUNTER — Other Ambulatory Visit: Payer: Self-pay | Admitting: Orthopaedic Surgery

## 2024-09-25 LAB — ANAEROBIC AND AEROBIC CULTURE
MICRO NUMBER:: 17499568
MICRO NUMBER:: 17499569
SPECIMEN QUALITY:: ADEQUATE
SPECIMEN QUALITY:: ADEQUATE

## 2024-09-25 MED ORDER — SULFAMETHOXAZOLE-TRIMETHOPRIM 800-160 MG PO TABS: 1.0000 | ORAL_TABLET | Freq: Two times a day (BID) | ORAL | 0 refills | Status: DC

## 2024-09-26 ENCOUNTER — Ambulatory Visit: Admitting: Orthopaedic Surgery

## 2024-09-26 ENCOUNTER — Encounter: Payer: Self-pay | Admitting: Orthopaedic Surgery

## 2024-09-26 DIAGNOSIS — M7041 Prepatellar bursitis, right knee: Secondary | ICD-10-CM

## 2024-09-26 DIAGNOSIS — Z96651 Presence of right artificial knee joint: Secondary | ICD-10-CM

## 2024-09-26 NOTE — Progress Notes (Signed)
 The patient is a 69 year old well-known to us .  He has been dealing with a preoperative also bursal infection of the right knee.  It did grew out resistant Staph epidermidis so we just switched him from doxycycline  yesterday to Bactrim  DS.  He denies any fever and chills.  There is of large fluid collection again around the prepatellar bursa.  I did aspirate a large amount of fluid off of this area again.  There is no redness and no significant discomfort.  He does have a knee revision in the knee on this side so there is concern that this could be seeding his knee joint.  We will see him this coming Monday for repeat aspiration and to see how the Bactrim  DS does for him.  He and his wife understand that we have a concern about needing to take him to surgery for an open I&D with arthrotomy as well in order to make sure that the revision joint can be salvaged.

## 2024-10-01 ENCOUNTER — Ambulatory Visit: Admitting: Orthopaedic Surgery

## 2024-10-03 ENCOUNTER — Ambulatory Visit: Admitting: Orthopaedic Surgery

## 2024-10-03 ENCOUNTER — Encounter: Payer: Self-pay | Admitting: Orthopaedic Surgery

## 2024-10-03 DIAGNOSIS — Z96651 Presence of right artificial knee joint: Secondary | ICD-10-CM | POA: Diagnosis not present

## 2024-10-03 DIAGNOSIS — M7041 Prepatellar bursitis, right knee: Secondary | ICD-10-CM

## 2024-10-03 MED ORDER — SULFAMETHOXAZOLE-TRIMETHOPRIM 800-160 MG PO TABS: 1.0000 | ORAL_TABLET | Freq: Two times a day (BID) | ORAL | 0 refills | Status: AC

## 2024-10-03 NOTE — Progress Notes (Signed)
 The patient is a 69 year old gentleman well-known to us .  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  It did grow out Staph epidermidis and we have him on Bactrim  which it is sensitive to.  He does have a knee revision arthroplasty under the prepatellar bursa and there is concern that that could get seeded with infection.  He currently denies fever and chills and reports less pain.  On exam of his right knee today, under sterile fluid collection but there is no redness and he looks better overall.  I was able to aspirate more fluid from his knee today and express a lot of fluid out of the knee in the prepatellar area.  This patient very comfortable.  There seems to be no joint effusion.  He will continue the Bactrim  double strength and we will see him back in a week to see how the prepatellar bursa infection is doing.  He would like to try to avoid surgery as would be.

## 2024-10-09 ENCOUNTER — Ambulatory Visit: Admitting: Internal Medicine

## 2024-10-09 ENCOUNTER — Ambulatory Visit (INDEPENDENT_AMBULATORY_CARE_PROVIDER_SITE_OTHER): Admitting: Physician Assistant

## 2024-10-10 ENCOUNTER — Ambulatory Visit: Admitting: Orthopaedic Surgery
